# Patient Record
Sex: Male | Born: 2000 | Race: White | Hispanic: No | Marital: Single | Smoking: Never smoker
Health system: Southern US, Community
[De-identification: ages and names within clinical notes are randomized; demographics above are authoritative.]

## PROBLEM LIST (undated history)

## (undated) DIAGNOSIS — T7840XA Allergy, unspecified, initial encounter: Secondary | ICD-10-CM

---
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
## 2019-01-03 DIAGNOSIS — S022XXA Fracture of nasal bones, initial encounter for closed fracture: Secondary | ICD-10-CM | POA: Diagnosis not present

## 2019-03-05 ENCOUNTER — Telehealth: Payer: Self-pay | Admitting: Pediatrics

## 2019-03-05 NOTE — Telephone Encounter (Signed)
Spoke with Mom. Douglas Adams will drop off paperwork and pick up copy of Immunization record. If physical needed will get something set up with new GP.

## 2019-03-05 NOTE — Telephone Encounter (Signed)
Mom called stating she needed an appointment for [place] to come in and see you to have paperwork filled out for College. Asked her what exactly that was and she wasn't sure. Asked her if he needed a physical. She was unsure. She wanted to switch appointments with her daughter Orvil Feil so he could get in, but told her if he needed a physical there wasn't time for that as her daughters appointment was for immunizations only.  Told Mother I would speak with Dr. Anastasio Champion, but that we are booked through September into October.  Mom states the school not being very helpful with information. Informed her Sriansh's last [facility] was Sept of last year.

## 2019-03-19 ENCOUNTER — Ambulatory Visit: Payer: Medicaid Other | Admitting: Pediatrics

## 2019-03-19 ENCOUNTER — Encounter: Payer: Self-pay | Admitting: Pediatrics

## 2019-03-19 ENCOUNTER — Other Ambulatory Visit: Payer: Self-pay

## 2019-03-19 VITALS — Temp 97.7°F | Ht 67.5 in | Wt 142.4 lb

## 2019-03-19 DIAGNOSIS — Z23 Encounter for immunization: Secondary | ICD-10-CM

## 2019-03-19 NOTE — Progress Notes (Addendum)
CC: Hepatitis A vaccine HPI: Patient is here for his second hepatitis A vaccine.  Patient is doing well today.  No questions or concerns.  Mother states that she only wants the "mandatory" vaccines.  Given that the patient has already had the first hepatitis A, the patient is fine getting his second hepatitis A vaccine today.   Vitals: Weight: 142 pounds 6 ounces            Height: 5 foot 7.5 inches            Temp: 97.7   Allergies: Penicillin   Assessment: Immunization Plan: Patient received his second hepatitis A vaccine today.  Documented on his college forms.

## 2019-05-15 DIAGNOSIS — H5043 Accommodative component in esotropia: Secondary | ICD-10-CM | POA: Diagnosis not present

## 2019-05-15 DIAGNOSIS — H538 Other visual disturbances: Secondary | ICD-10-CM | POA: Diagnosis not present

## 2019-05-27 DIAGNOSIS — J3089 Other allergic rhinitis: Secondary | ICD-10-CM | POA: Diagnosis not present

## 2019-05-27 DIAGNOSIS — J3081 Allergic rhinitis due to animal (cat) (dog) hair and dander: Secondary | ICD-10-CM | POA: Diagnosis not present

## 2019-05-27 DIAGNOSIS — H1045 Other chronic allergic conjunctivitis: Secondary | ICD-10-CM | POA: Diagnosis not present

## 2019-05-27 DIAGNOSIS — J301 Allergic rhinitis due to pollen: Secondary | ICD-10-CM | POA: Diagnosis not present

## 2019-06-02 DIAGNOSIS — I861 Scrotal varices: Secondary | ICD-10-CM | POA: Diagnosis not present

## 2020-11-09 DIAGNOSIS — M25561 Pain in right knee: Secondary | ICD-10-CM | POA: Diagnosis not present

## 2020-12-03 DIAGNOSIS — M25561 Pain in right knee: Secondary | ICD-10-CM | POA: Diagnosis not present

## 2021-01-24 DIAGNOSIS — J0100 Acute maxillary sinusitis, unspecified: Secondary | ICD-10-CM | POA: Diagnosis not present

## 2021-03-29 DIAGNOSIS — S93492A Sprain of other ligament of left ankle, initial encounter: Secondary | ICD-10-CM | POA: Diagnosis not present

## 2021-04-29 DIAGNOSIS — H1045 Other chronic allergic conjunctivitis: Secondary | ICD-10-CM | POA: Diagnosis not present

## 2021-04-29 DIAGNOSIS — J3089 Other allergic rhinitis: Secondary | ICD-10-CM | POA: Diagnosis not present

## 2021-04-29 DIAGNOSIS — J3081 Allergic rhinitis due to animal (cat) (dog) hair and dander: Secondary | ICD-10-CM | POA: Diagnosis not present

## 2021-04-29 DIAGNOSIS — J301 Allergic rhinitis due to pollen: Secondary | ICD-10-CM | POA: Diagnosis not present

## 2021-11-10 DIAGNOSIS — S838X1A Sprain of other specified parts of right knee, initial encounter: Secondary | ICD-10-CM | POA: Diagnosis not present

## 2021-11-11 DIAGNOSIS — S838X1A Sprain of other specified parts of right knee, initial encounter: Secondary | ICD-10-CM | POA: Diagnosis not present

## 2021-11-12 DIAGNOSIS — M25561 Pain in right knee: Secondary | ICD-10-CM | POA: Diagnosis not present

## 2021-11-15 DIAGNOSIS — M25561 Pain in right knee: Secondary | ICD-10-CM | POA: Diagnosis not present

## 2021-11-22 ENCOUNTER — Encounter (HOSPITAL_BASED_OUTPATIENT_CLINIC_OR_DEPARTMENT_OTHER): Payer: Self-pay | Admitting: Orthopaedic Surgery

## 2021-11-29 NOTE — Discharge Instructions (Addendum)
Ramond Marrowax Varkey MD, MPH Alfonse Alpersaroline McBane, PA-C [facility] [address], [address] [phone] (tel)   [phone] (fax)   POST-OPERATIVE INSTRUCTIONS - ACL RECONSTRUCTION  WOUND CARE You may remove the Operative Dressing on Post-Op Day #3 (72hrs after surgery).   Leave steri strips in place.   If you feel more comfortable with it you can leave all dressings in place till your 1 week follow-up appointment.   KEEP THE INCISIONS CLEAN AND DRY. An ACE wrap may be used to control swelling, do not wrap this too tight.  If the initial ACE wrap feels too tight or constricting you may loosen it. There may be a small amount of fluid/bleeding leaking at the surgical site.  This is normal; the knee is filled with fluid during the procedure and can leak for 24-48hrs after surgery.  You may change/reinforce the bandage as needed.  Use the Cryocuff, GameReady or Ice as often as possible for the first 3-4 days, then as needed for pain relief. Always keep a towel, ACE wrap or other barrier between the cooling unit and your skin.  You may shower on Post-Op Day #3. Gently pat the area dry. Do not soak the knee in water.  Do not go swimming in the pool or ocean until 4 weeks after surgery or when otherwise instructed.  BRACE/AMBULATION Your leg will be placed in a brace post-operatively.  You will need to wear your brace at all times until we discuss it further.  It should be locked in full extension (0 degrees) if adjustable.   You will be instructed on further bracing after your first visit. Use crutches for comfort but you can put your full weight on the leg as tolerated.  PHYSICAL THERAPY - You will begin physical therapy soon after surgery (unless otherwise specified) - Please call to set up an appointment, if you do not already have one  - Let our office if there are any issues with scheduling your therapy  - A PT referral was sent to [facility]  REGIONAL   REGIONAL ANESTHESIA (NERVE BLOCKS) - The anesthesia team may have performed a nerve block for you if safe in the setting of your care.  This is a great tool used to minimize pain.  Typically the block may start wearing off overnight.  This can be a challenging period but please utilize your as needed pain medications to try and manage this period and know it will be a brief transition as the nerve block wears completely   POST-OP MEDICATIONS- Multimodal approach to pain control In general your pain will be controlled with a combination of substances.  Prescriptions unless otherwise discussed are electronically sent to your pharmacy.  This is a carefully made plan we use to minimize narcotic use.     Meloxicam - Anti-inflammatory medication taken on a scheduled basis Acetaminophen - Non-narcotic pain medicine taken on a scheduled basis  Oxycodone - This is a strong narcotic, to be used only on an "as needed" basis for SEVERE pain. Aspirin 81mg  - This medicine is used to minimize the risk of blood clots after surgery. Zofran - take as needed for nausea   FOLLOW-UP Please call the office to schedule a follow-up appointment for your incision check if you do not already have one, 7-10 days post-operatively. IF YOU HAVE ANY QUESTIONS, PLEASE FEEL FREE TO CALL OUR OFFICE.  HELPFUL INFORMATION  If you had a block, it will wear off  between 8-24 hrs postop typically.  This is period when your pain may go from nearly zero to the pain you would have had post-op without the block.  This is an abrupt transition but nothing dangerous is happening.  You may take an extra dose of narcotic when this happens.  Keep your leg elevated to decrease swelling, which will then in turn decrease your pain. I would elevate the foot of your bed by putting a couple of couch pillows between your mattress and box spring. I would not keep pillow directly under your ankle.  You must wear the brace locked while  sleeping and ambulating until follow-up.   There will be MORE swelling on days 1-3 than there is on the day of surgery.  This also is normal. The swelling will decrease with the anti-inflammatory medication, ice and keeping it elevated. The swelling will make it more difficult to bend your knee. As the swelling goes down your motion will become easier  You may develop swelling and bruising that extends from your knee down to your calf and perhaps even to your foot over the next week. Do not be alarmed. This too is normal, and it is due to gravity  There may be some numbness adjacent to the incision site. This may last for 6-12 months or longer in some patients and is expected.  You may return to sedentary work/school in the next couple of days when you feel up to it. You will need to keep your leg elevated as much as possible   You should wean off your narcotic medicines as soon as you are able.  Most patients will be off or using minimal narcotics before their first postop appointment.   We suggest you use the pain medication the first night prior to going to bed, in order to ease any pain when the anesthesia wears off. You should avoid taking pain medications on an empty stomach as it will make you nauseous.  Do not drink alcoholic beverages or take illicit drugs when taking pain medications.  It is against the law to drive while taking narcotics. You cannot drive if your Right leg is in brace locked in extension.  Pain medication may make you constipated.  Below are a few solutions to try in this order: Decrease the amount of pain medication if you aren't having pain. Drink lots of decaffeinated fluids. Drink prune juice and/or each dried prunes  If the first 3 don't work start with additional solutions Take Colace - an over-the-counter stool softener Take Senokot - an over-the-counter laxative Take Miralax - a stronger over-the-counter laxative  For more information including helpful  videos and documents visit our website:   [email or web address]    Post Anesthesia Home Care Instructions  Activity: Get plenty of rest for the remainder of the day. A responsible individual must stay with you for 24 hours following the procedure.  For the next 24 hours, DO NOT: -Drive a car -Advertising copywriter -Drink alcoholic beverages -Take any medication unless instructed by your physician -Make any legal decisions or sign important papers.  Meals: Start with liquid foods such as gelatin or soup. Progress to regular foods as tolerated. Avoid greasy, spicy, heavy foods. If nausea and/or vomiting occur, drink only clear liquids until the nausea and/or vomiting subsides. Call your physician if vomiting continues.  Special Instructions/Symptoms: Your throat may feel dry or sore from the anesthesia or the breathing tube placed in your throat during surgery. If this causes discomfort, gargle  with warm salt water. The discomfort should disappear within 24 hours.  If you had a scopolamine patch placed behind your ear for the management of post- operative nausea and/or vomiting:  1. The medication in the patch is effective for 72 hours, after which it should be removed.  Wrap patch in a tissue and discard in the trash. Wash hands thoroughly with soap and water. 2. You may remove the patch earlier than 72 hours if you experience unpleasant side effects which may include dry mouth, dizziness or visual disturbances. 3. Avoid touching the patch. Wash your hands with soap and water after contact with the patch.  Regional Anesthesia Blocks  1. Numbness or the inability to move the "blocked" extremity may last from 3-48 hours after placement. The length of time depends on the medication injected and your individual response to the medication. If the numbness is not going away after 48 hours, call your surgeon.  2. The extremity that is blocked will need to be  protected until the numbness is gone and the  Strength has returned. Because you cannot feel it, you will need to take extra care to avoid injury. Because it may be weak, you may have difficulty moving it or using it. You may not know what position it is in without looking at it while the block is in effect.  3. For blocks in the legs and feet, returning to weight bearing and walking needs to be done carefully. You will need to wait until the numbness is entirely gone and the strength has returned. You should be able to move your leg and foot normally before you try and bear weight or walk. You will need someone to be with you when you first try to ensure you do not fall and possibly risk injury.  4. Bruising and tenderness at the needle site are common side effects and will resolve in a few days.  5. Persistent numbness or new problems with movement should be communicated to the surgeon or the [facility] [phone] [facility] [phone]).

## 2021-11-29 NOTE — H&P (Signed)
PREOPERATIVE H&P  Chief Complaint: RIGHT KNEE ACL TEAR  HPI: Douglas Adams is a 21 y.o. male who is scheduled for, Procedure(s): RECONSTRUCTION ANTERIOR CRUCIATE LIGAMENT (ACL).   Patient is a healthy 21 year old who had a twisting injury while playing basketball.  He had a pop with swelling afterwards.  He was unable to bear weight.  Symptoms are rated as moderate to severe, and have been worsening.  This is significantly impairing activities of daily living.    Please see clinic note for further details on this patient's care.    He has elected for surgical management.   Past Medical History:  Diagnosis Date   Allergies    Past Surgical History:  Procedure Laterality Date   ADENOIDECTOMY AND MYRINGOTOMY WITH TUBE PLACEMENT Bilateral 2007   Social History   Socioeconomic History   Marital status: Single    Spouse name: Not on file   Number of children: Not on file   Years of education: Not on file   Highest education level: Not on file  Occupational History   Not on file  Tobacco Use   Smoking status: Never   Smokeless tobacco: Never  Vaping Use   Vaping Use: Never used  Substance and Sexual Activity   Alcohol use: No   Drug use: Never   Sexual activity: Not on file  Other Topics Concern   Not on file  Social History Narrative   Not on file   Social Determinants of Health   Financial Resource Strain: Not on file  Food Insecurity: Not on file  Transportation Needs: Not on file  Physical Activity: Not on file  Stress: Not on file  Social Connections: Not on file   History reviewed. No pertinent family history. Allergies  Allergen Reactions   Augmentin [Amoxicillin-Pot Clavulanate] Nausea And Vomiting   Prior to Admission medications   Medication Sig Start Date End Date Taking? Authorizing Provider  ibuprofen (ADVIL,MOTRIN) 600 MG tablet Take 1 tablet (600 mg total) by mouth every 6 (six) hours as needed. 03/28/17  Yes Upstill, Shari, PA-C   montelukast (SINGULAIR) 10 MG tablet Take 10 mg by mouth at bedtime.   Yes [provider]  naproxen sodium (ALEVE) 220 MG tablet Take 220 mg by mouth.   Yes [provider]  cetirizine (ZYRTEC) 10 MG tablet Take 10 mg by mouth daily.    [provider]  fluticasone (FLONASE) 50 MCG/ACT nasal spray Place 1 spray into the nose daily. 03/30/11 03/29/12  Marcha Solders, MD    ROS: All other systems have been reviewed and were otherwise negative with the exception of those mentioned in the HPI and as above.  Physical Exam: General: Alert, no acute distress Cardiovascular: No pedal edema Respiratory: No cyanosis, no use of accessory musculature GI: No organomegaly, abdomen is soft and non-tender Skin: No lesions in the area of chief complaint Neurologic: Sensation intact distally Psychiatric: Patient is competent for consent with normal mood and affect Lymphatic: No axillary or cervical lymphadenopathy  MUSCULOSKELETAL:  Range of motion of the knee is limited.  He has 5-90 degrees of range of motion.  He has a grossly positive Lachman's with significantly increased translation compared to the contralateral side.  Exam is limited outside of this.  Imaging: MRI demonstrates a full ACL rupture with a bone bruise, medial MCL sprain.  Assessment: RIGHT KNEE ACL TEAR  Plan: Plan for Procedure(s): RECONSTRUCTION ANTERIOR CRUCIATE LIGAMENT (ACL)  The risks benefits and alternatives were discussed  with the patient including but not limited to the risks of nonoperative treatment, versus surgical intervention including infection, bleeding, nerve injury,  blood clots, cardiopulmonary complications, morbidity, mortality, among others, and they were willing to proceed.   The patient acknowledged the explanation, agreed to proceed with the plan and consent was signed.   Operative Plan: Right knee scope with BTB ACL reconstruction Discharge Medications: Standard DVT  Prophylaxis: Aspirin Physical Therapy: Outpatient PT- [facility] [place] [facility] Discharge needs: [place] (order). Foster, PA-C  11/29/2021 4:42 PM

## 2021-12-01 ENCOUNTER — Encounter (HOSPITAL_BASED_OUTPATIENT_CLINIC_OR_DEPARTMENT_OTHER): Payer: Self-pay | Admitting: Orthopaedic Surgery

## 2021-12-01 ENCOUNTER — Ambulatory Visit (HOSPITAL_BASED_OUTPATIENT_CLINIC_OR_DEPARTMENT_OTHER): Payer: 59 | Admitting: Anesthesiology

## 2021-12-01 ENCOUNTER — Ambulatory Visit (HOSPITAL_BASED_OUTPATIENT_CLINIC_OR_DEPARTMENT_OTHER)
Admission: RE | Admit: 2021-12-01 | Discharge: 2021-12-01 | Disposition: A | Discharge status: Home / Self Care | Payer: 59 | Attending: Orthopaedic Surgery | Admitting: Orthopaedic Surgery

## 2021-12-01 ENCOUNTER — Encounter (HOSPITAL_BASED_OUTPATIENT_CLINIC_OR_DEPARTMENT_OTHER)
Admission: RE | Disposition: A | Discharge status: Home / Self Care | Payer: Self-pay | Source: Home / Self Care | Attending: Orthopaedic Surgery

## 2021-12-01 ENCOUNTER — Other Ambulatory Visit: Payer: Self-pay

## 2021-12-01 DIAGNOSIS — Y9367 Activity, basketball: Secondary | ICD-10-CM | POA: Insufficient documentation

## 2021-12-01 DIAGNOSIS — X501XXA Overexertion from prolonged static or awkward postures, initial encounter: Secondary | ICD-10-CM | POA: Insufficient documentation

## 2021-12-01 DIAGNOSIS — S83511A Sprain of anterior cruciate ligament of right knee, initial encounter: Secondary | ICD-10-CM

## 2021-12-01 DIAGNOSIS — Z9889 Other specified postprocedural states: Secondary | ICD-10-CM

## 2021-12-01 DIAGNOSIS — M2341 Loose body in knee, right knee: Secondary | ICD-10-CM | POA: Diagnosis not present

## 2021-12-01 DIAGNOSIS — R112 Nausea with vomiting, unspecified: Secondary | ICD-10-CM

## 2021-12-01 DIAGNOSIS — S83281A Other tear of lateral meniscus, current injury, right knee, initial encounter: Secondary | ICD-10-CM | POA: Insufficient documentation

## 2021-12-01 HISTORY — PX: ANTERIOR CRUCIATE LIGAMENT REPAIR: SHX115

## 2021-12-01 HISTORY — DX: Allergy, unspecified, initial encounter: T78.40XA

## 2021-12-01 HISTORY — DX: Other specified postprocedural states: Z98.890

## 2021-12-01 HISTORY — DX: Other specified postprocedural states: R11.2

## 2021-12-01 SURGERY — RECONSTRUCTION, KNEE, ACL
Anesthesia: General | Site: Knee | Laterality: Right

## 2021-12-01 MED ORDER — DEXAMETHASONE SODIUM PHOSPHATE 10 MG/ML IJ SOLN
INTRAMUSCULAR | Status: AC
  Filled 2021-12-01: qty 1

## 2021-12-01 MED ORDER — ONDANSETRON HCL 4 MG/2ML IJ SOLN
INTRAMUSCULAR | Status: AC
  Filled 2021-12-01: qty 2

## 2021-12-01 MED ORDER — SODIUM CHLORIDE 0.9 % IV SOLN
12.5000 mg | Freq: Once | INTRAVENOUS | Status: AC
  Administered 2021-12-01: 12.5 mg via INTRAVENOUS
  Filled 2021-12-01: qty 0.5

## 2021-12-01 MED ORDER — EPINEPHRINE PF 1 MG/ML IJ SOLN
INTRAMUSCULAR | Status: AC
  Filled 2021-12-01: qty 2

## 2021-12-01 MED ORDER — ACETAMINOPHEN 325 MG PO TABS: 325.0000 mg | ORAL_TABLET | ORAL | Status: DC | PRN

## 2021-12-01 MED ORDER — METOCLOPRAMIDE HCL 5 MG PO TABS: 5.0000 mg | ORAL_TABLET | Freq: Three times a day (TID) | ORAL | Status: DC | PRN

## 2021-12-01 MED ORDER — AMISULPRIDE (ANTIEMETIC) 5 MG/2ML IV SOLN
10.0000 mg | Freq: Once | INTRAVENOUS | Status: AC
  Administered 2021-12-01: 10 mg via INTRAVENOUS

## 2021-12-01 MED ORDER — HYDROMORPHONE HCL 1 MG/ML IJ SOLN
INTRAMUSCULAR | Status: AC
  Filled 2021-12-01: qty 1

## 2021-12-01 MED ORDER — HYDROMORPHONE HCL 1 MG/ML IJ SOLN
INTRAMUSCULAR | Status: AC
  Filled 2021-12-01: qty 0.5

## 2021-12-01 MED ORDER — MIDAZOLAM HCL 2 MG/2ML IJ SOLN
INTRAMUSCULAR | Status: AC
  Filled 2021-12-01: qty 2

## 2021-12-01 MED ORDER — LACTATED RINGERS IV SOLN: INTRAVENOUS | Status: DC

## 2021-12-01 MED ORDER — FENTANYL CITRATE (PF) 100 MCG/2ML IJ SOLN
INTRAMUSCULAR | Status: DC | PRN
  Administered 2021-12-01 (×2): 25 ug via INTRAVENOUS

## 2021-12-01 MED ORDER — HYDROMORPHONE HCL 1 MG/ML IJ SOLN: 0.5000 mg | INTRAMUSCULAR | Status: DC | PRN

## 2021-12-01 MED ORDER — ONDANSETRON HCL 4 MG PO TABS: 4.0000 mg | ORAL_TABLET | Freq: Four times a day (QID) | ORAL | Status: DC | PRN

## 2021-12-01 MED ORDER — OXYCODONE HCL 5 MG PO TABS: ORAL_TABLET | ORAL | 0 refills | Status: AC

## 2021-12-01 MED ORDER — FENTANYL CITRATE (PF) 100 MCG/2ML IJ SOLN
INTRAMUSCULAR | Status: AC
  Filled 2021-12-01: qty 2

## 2021-12-01 MED ORDER — ACETAMINOPHEN 500 MG PO TABS
1000.0000 mg | ORAL_TABLET | Freq: Three times a day (TID) | ORAL | 0 refills | Status: AC
Start: 2021-12-01 — End: 2021-12-15

## 2021-12-01 MED ORDER — DEXAMETHASONE SODIUM PHOSPHATE 10 MG/ML IJ SOLN
INTRAMUSCULAR | Status: DC | PRN
  Administered 2021-12-01: 10 mg via INTRAVENOUS

## 2021-12-01 MED ORDER — DOCUSATE SODIUM 100 MG PO CAPS
100.0000 mg | ORAL_CAPSULE | Freq: Two times a day (BID) | ORAL | Status: DC
Start: 2021-12-01 — End: 2021-12-02

## 2021-12-01 MED ORDER — ONDANSETRON HCL 4 MG/2ML IJ SOLN
INTRAMUSCULAR | Status: DC | PRN
  Administered 2021-12-01: 4 mg via INTRAVENOUS

## 2021-12-01 MED ORDER — ONDANSETRON HCL 4 MG/2ML IJ SOLN: 4.0000 mg | Freq: Four times a day (QID) | INTRAMUSCULAR | Status: DC | PRN

## 2021-12-01 MED ORDER — MEPERIDINE HCL 25 MG/ML IJ SOLN
6.2500 mg | INTRAMUSCULAR | Status: DC | PRN
  Administered 2021-12-01 (×3): 6.25 mg via INTRAVENOUS

## 2021-12-01 MED ORDER — METHOCARBAMOL 1000 MG/10ML IJ SOLN
500.0000 mg | Freq: Four times a day (QID) | INTRAVENOUS | Status: DC | PRN
  Filled 2021-12-01: qty 5

## 2021-12-01 MED ORDER — OXYCODONE HCL 5 MG PO TABS
5.0000 mg | ORAL_TABLET | ORAL | Status: DC | PRN
  Administered 2021-12-01: 5 mg via ORAL
  Filled 2021-12-01: qty 1

## 2021-12-01 MED ORDER — OXYCODONE HCL 5 MG/5ML PO SOLN: 5.0000 mg | Freq: Once | ORAL | Status: DC | PRN

## 2021-12-01 MED ORDER — AMISULPRIDE (ANTIEMETIC) 5 MG/2ML IV SOLN
INTRAVENOUS | Status: AC
Start: 2021-12-01 — End: ?
  Filled 2021-12-01: qty 4

## 2021-12-01 MED ORDER — ACETAMINOPHEN 160 MG/5ML PO SOLN: 325.0000 mg | ORAL | Status: DC | PRN

## 2021-12-01 MED ORDER — CEFAZOLIN SODIUM-DEXTROSE 2-4 GM/100ML-% IV SOLN
INTRAVENOUS | Status: AC
  Filled 2021-12-01: qty 100

## 2021-12-01 MED ORDER — PROPOFOL 10 MG/ML IV BOLUS
INTRAVENOUS | Status: DC | PRN
  Administered 2021-12-01 (×2): 100 mg via INTRAVENOUS

## 2021-12-01 MED ORDER — ONDANSETRON HCL 4 MG/2ML IJ SOLN: 4.0000 mg | Freq: Once | INTRAMUSCULAR | Status: AC

## 2021-12-01 MED ORDER — CEFAZOLIN SODIUM-DEXTROSE 2-4 GM/100ML-% IV SOLN
2.0000 g | INTRAVENOUS | Status: AC
  Administered 2021-12-01: 2 g via INTRAVENOUS

## 2021-12-01 MED ORDER — MIDAZOLAM HCL 2 MG/2ML IJ SOLN
2.0000 mg | Freq: Once | INTRAMUSCULAR | Status: AC
  Administered 2021-12-01: 2 mg via INTRAVENOUS

## 2021-12-01 MED ORDER — ONDANSETRON HCL 4 MG/2ML IJ SOLN
INTRAMUSCULAR | Status: AC
Start: 2021-12-01 — End: ?
  Filled 2021-12-01: qty 2

## 2021-12-01 MED ORDER — ACETAMINOPHEN 500 MG PO TABS
ORAL_TABLET | ORAL | Status: AC
  Filled 2021-12-01: qty 1

## 2021-12-01 MED ORDER — ONDANSETRON HCL 4 MG PO TABS: 4.0000 mg | ORAL_TABLET | Freq: Three times a day (TID) | ORAL | 0 refills | Status: AC | PRN

## 2021-12-01 MED ORDER — HYDROMORPHONE HCL 1 MG/ML IJ SOLN
0.5000 mg | INTRAMUSCULAR | Status: DC | PRN
  Administered 2021-12-01: 0.25 mg via INTRAVENOUS

## 2021-12-01 MED ORDER — BISACODYL 5 MG PO TBEC: 5.0000 mg | DELAYED_RELEASE_TABLET | Freq: Every day | ORAL | Status: DC | PRN

## 2021-12-01 MED ORDER — ONDANSETRON HCL 4 MG/2ML IJ SOLN
4.0000 mg | Freq: Once | INTRAMUSCULAR | Status: AC | PRN
  Administered 2021-12-01: 4 mg via INTRAVENOUS

## 2021-12-01 MED ORDER — VANCOMYCIN HCL 1000 MG IV SOLR
INTRAVENOUS | Status: AC
  Filled 2021-12-01: qty 20

## 2021-12-01 MED ORDER — ACETAMINOPHEN 500 MG PO TABS: 1000.0000 mg | ORAL_TABLET | Freq: Four times a day (QID) | ORAL | Status: DC

## 2021-12-01 MED ORDER — METHOCARBAMOL 500 MG PO TABS: 500.0000 mg | ORAL_TABLET | Freq: Four times a day (QID) | ORAL | Status: DC | PRN

## 2021-12-01 MED ORDER — MELOXICAM 15 MG PO TABS: 15.0000 mg | ORAL_TABLET | Freq: Every day | ORAL | 0 refills | Status: DC

## 2021-12-01 MED ORDER — POLYETHYLENE GLYCOL 3350 17 G PO PACK: 17.0000 g | PACK | Freq: Every day | ORAL | Status: DC | PRN

## 2021-12-01 MED ORDER — OXYCODONE HCL 5 MG PO TABS: 10.0000 mg | ORAL_TABLET | ORAL | Status: DC | PRN

## 2021-12-01 MED ORDER — SODIUM CHLORIDE 0.9 % IR SOLN
Status: DC | PRN
  Administered 2021-12-01: 6000 mL

## 2021-12-01 MED ORDER — PROMETHAZINE HCL 25 MG/ML IJ SOLN
INTRAMUSCULAR | Status: AC
  Filled 2021-12-01: qty 1

## 2021-12-01 MED ORDER — METOCLOPRAMIDE HCL 5 MG/ML IJ SOLN: 5.0000 mg | Freq: Three times a day (TID) | INTRAMUSCULAR | Status: DC | PRN

## 2021-12-01 MED ORDER — ROPIVACAINE HCL 7.5 MG/ML IJ SOLN
INTRAMUSCULAR | Status: DC | PRN
  Administered 2021-12-01: 30 mL via PERINEURAL

## 2021-12-01 MED ORDER — SCOPOLAMINE 1 MG/3DAYS TD PT72
MEDICATED_PATCH | TRANSDERMAL | Status: AC
Start: 2021-12-01 — End: ?
  Filled 2021-12-01: qty 1

## 2021-12-01 MED ORDER — DEXAMETHASONE SODIUM PHOSPHATE 10 MG/ML IJ SOLN
INTRAMUSCULAR | Status: DC | PRN
  Administered 2021-12-01: 10 mg

## 2021-12-01 MED ORDER — SCOPOLAMINE 1 MG/3DAYS TD PT72
1.0000 | MEDICATED_PATCH | TRANSDERMAL | Status: DC
Start: 2021-12-01 — End: 2021-12-01
  Administered 2021-12-01: 1.5 mg via TRANSDERMAL

## 2021-12-01 MED ORDER — ASPIRIN 81 MG PO CHEW: 81.0000 mg | CHEWABLE_TABLET | Freq: Two times a day (BID) | ORAL | 0 refills | Status: AC

## 2021-12-01 MED ORDER — KETOROLAC TROMETHAMINE 30 MG/ML IJ SOLN
INTRAMUSCULAR | Status: DC | PRN
  Administered 2021-12-01: 30 mg via INTRAVENOUS

## 2021-12-01 MED ORDER — VANCOMYCIN HCL 1000 MG IV SOLR
INTRAVENOUS | Status: DC | PRN
  Administered 2021-12-01: 1000 mg via TOPICAL

## 2021-12-01 MED ORDER — MEPERIDINE HCL 25 MG/ML IJ SOLN
INTRAMUSCULAR | Status: AC
  Filled 2021-12-01: qty 1

## 2021-12-01 MED ORDER — HYDROMORPHONE HCL 1 MG/ML IJ SOLN
INTRAMUSCULAR | Status: DC | PRN
  Administered 2021-12-01: .5 mg via INTRAVENOUS

## 2021-12-01 MED ORDER — FENTANYL CITRATE (PF) 100 MCG/2ML IJ SOLN
100.0000 ug | Freq: Once | INTRAMUSCULAR | Status: AC
  Administered 2021-12-01: 100 ug via INTRAVENOUS

## 2021-12-01 MED ORDER — ACETAMINOPHEN 500 MG PO TABS
1000.0000 mg | ORAL_TABLET | Freq: Once | ORAL | Status: AC
  Administered 2021-12-01: 1000 mg via ORAL

## 2021-12-01 MED ORDER — PROPOFOL 10 MG/ML IV BOLUS
INTRAVENOUS | Status: AC
  Filled 2021-12-01: qty 20

## 2021-12-01 MED ORDER — PROMETHAZINE HCL 6.25 MG/5ML PO SYRP: 12.5000 mg | ORAL_SOLUTION | Freq: Four times a day (QID) | ORAL | Status: DC | PRN

## 2021-12-01 MED ORDER — OXYCODONE HCL 5 MG PO TABS: 5.0000 mg | ORAL_TABLET | Freq: Once | ORAL | Status: DC | PRN

## 2021-12-01 MED ORDER — FENTANYL CITRATE (PF) 100 MCG/2ML IJ SOLN
25.0000 ug | INTRAMUSCULAR | Status: DC | PRN
  Administered 2021-12-01: 25 ug via INTRAVENOUS

## 2021-12-01 SURGICAL SUPPLY — 69 items
APL PRP STRL LF DISP 70% ISPRP (MISCELLANEOUS) ×1
BLADE AVERAGE 25X9 (BLADE) ×2 IMPLANT
BLADE SHAVER BONE 5.0X13 (MISCELLANEOUS) ×2 IMPLANT
BLADE SURG 10 STRL SS (BLADE) ×2 IMPLANT
BLADE SURG 15 STRL LF DISP TIS (BLADE) ×1 IMPLANT
BLADE SURG 15 STRL SS (BLADE) ×2
BNDG ELASTIC 6X5.8 VLCR STR LF (GAUZE/BANDAGES/DRESSINGS) ×1 IMPLANT
BONE TUNNEL PLUG CANNULATED (MISCELLANEOUS) ×2 IMPLANT
BURR OVAL 8 FLU 4.0X13 (MISCELLANEOUS) IMPLANT
CHLORAPREP W/TINT 26 (MISCELLANEOUS) ×2 IMPLANT
CLSR STERI-STRIP ANTIMIC 1/2X4 (GAUZE/BANDAGES/DRESSINGS) ×2 IMPLANT
COLLECTOR GRAFT TISSUE (SYSTAGENIX WOUND MANAGEMENT) ×2
COOLER ICEMAN CLASSIC (MISCELLANEOUS) ×2 IMPLANT
COVER BACK TABLE 60X90IN (DRAPES) ×2 IMPLANT
CUFF TOURN SGL QUICK 34 (TOURNIQUET CUFF)
CUFF TRNQT CYL 34X4.125X (TOURNIQUET CUFF) IMPLANT
DISSECTOR 3.5MM X 13CM CVD (MISCELLANEOUS) IMPLANT
DISSECTOR 4.0MMX13CM CVD (MISCELLANEOUS) ×2 IMPLANT
DRAPE IMP U-DRAPE 54X76 (DRAPES) ×1 IMPLANT
DRAPE U-SHAPE 47X51 STRL (DRAPES) ×2 IMPLANT
DRAPE-T ARTHROSCOPY W/POUCH (DRAPES) ×2 IMPLANT
ELECT REM PT RETURN 9FT ADLT (ELECTROSURGICAL) ×2
ELECTRODE REM PT RTRN 9FT ADLT (ELECTROSURGICAL) ×1 IMPLANT
GAUZE SPONGE 4X4 12PLY STRL (GAUZE/BANDAGES/DRESSINGS) ×4 IMPLANT
GLOVE BIO SURGEON STRL SZ 6.5 (GLOVE) ×2 IMPLANT
GLOVE BIOGEL PI IND STRL 6.5 (GLOVE) ×1 IMPLANT
GLOVE BIOGEL PI IND STRL 7.0 (GLOVE) IMPLANT
GLOVE BIOGEL PI IND STRL 8 (GLOVE) ×1 IMPLANT
GLOVE BIOGEL PI INDICATOR 6.5 (GLOVE) ×1
GLOVE BIOGEL PI INDICATOR 7.0 (GLOVE) ×1
GLOVE BIOGEL PI INDICATOR 8 (GLOVE) ×2
GLOVE ECLIPSE 7.5 STRL STRAW (GLOVE) ×1 IMPLANT
GLOVE ECLIPSE 8.0 STRL XLNG CF (GLOVE) ×2 IMPLANT
GOWN STRL REUS W/ TWL LRG LVL3 (GOWN DISPOSABLE) ×2 IMPLANT
GOWN STRL REUS W/TWL LRG LVL3 (GOWN DISPOSABLE) ×4
GOWN STRL REUS W/TWL XL LVL3 (GOWN DISPOSABLE) ×2 IMPLANT
IMMOBILIZER KNEE 22 UNIV (SOFTGOODS) IMPLANT
IMMOBILIZER KNEE 24 THIGH 36 (MISCELLANEOUS) IMPLANT
IMMOBILIZER KNEE 24 UNIV (MISCELLANEOUS)
IV NS IRRIG 3000ML ARTHROMATIC (IV SOLUTION) ×8 IMPLANT
KIT TRANSTIBIAL (DISPOSABLE) ×2 IMPLANT
KNIFE GRAFT ACL 10MM 5952 (MISCELLANEOUS) ×2 IMPLANT
KNIFE GRAFT ACL 9MM (MISCELLANEOUS) IMPLANT
MANIFOLD NEPTUNE II (INSTRUMENTS) ×2 IMPLANT
NS IRRIG 1000ML POUR BTL (IV SOLUTION) ×2 IMPLANT
PACK ARTHROSCOPY DSU (CUSTOM PROCEDURE TRAY) ×2 IMPLANT
PACK BASIN DAY SURGERY FS (CUSTOM PROCEDURE TRAY) ×2 IMPLANT
PAD COLD SHLDR WRAP-ON (PAD) ×2 IMPLANT
PENCIL SMOKE EVACUATOR (MISCELLANEOUS) IMPLANT
PORT APPOLLO RF 90DEGREE MULTI (SURGICAL WAND) ×1 IMPLANT
PORTAL SKID DEVICE (INSTRUMENTS) IMPLANT
SCREW SHEATHED INTERF 7X25 (Screw) ×1 IMPLANT
SCREW SHEATHED INTERF 8X20MM (Screw) ×1 IMPLANT
SLEEVE SCD COMPRESS KNEE MED (STOCKING) ×1 IMPLANT
SPIKE FLUID TRANSFER (MISCELLANEOUS) IMPLANT
SPONGE T-LAP 4X18 ~~LOC~~+RFID (SPONGE) ×2 IMPLANT
SUT FIBERWIRE #2 38 T-5 BLUE (SUTURE) ×12
SUT MNCRL AB 4-0 PS2 18 (SUTURE) ×1 IMPLANT
SUT VIC AB 0 CT1 27 (SUTURE) ×4
SUT VIC AB 0 CT1 27XBRD ANBCTR (SUTURE) ×1 IMPLANT
SUT VIC AB 3-0 SH 27 (SUTURE) ×2
SUT VIC AB 3-0 SH 27X BRD (SUTURE) ×1 IMPLANT
SUTURE FIBERWR #2 38 T-5 BLUE (SUTURE) ×4 IMPLANT
SYR BULB EAR ULCER 3OZ GRN STR (SYRINGE) ×1 IMPLANT
TISSUE GRAFT COLLECTOR (SYSTAGENIX WOUND MANAGEMENT) IMPLANT
TOWEL GREEN STERILE FF (TOWEL DISPOSABLE) ×4 IMPLANT
TUBE CONNECTING 20X1/4 (TUBING) ×2 IMPLANT
TUBE SUCTION HIGH CAP CLEAR NV (SUCTIONS) ×2 IMPLANT
TUBING ARTHROSCOPY IRRIG 16FT (MISCELLANEOUS) ×2 IMPLANT

## 2021-12-01 NOTE — Anesthesia Preprocedure Evaluation (Addendum)
Anesthesia Evaluation  Patient identified by MRN, date of birth, ID band Patient awake    Reviewed: Allergy & Precautions, H&P , NPO status , Patient's Chart, lab work & pertinent test results, reviewed documented beta blocker date and time   Airway Mallampati: I  TM Distance: >3 FB Neck ROM: full    Dental no notable dental hx. (+) Teeth Intact, Dental Advisory Given   Pulmonary neg pulmonary ROS,    Pulmonary exam normal breath sounds clear to auscultation       Cardiovascular Exercise Tolerance: Good negative cardio ROS   Rhythm:regular Rate:Normal     Neuro/Psych negative neurological ROS  negative psych ROS   GI/Hepatic negative GI ROS, Neg liver ROS,   Endo/Other  negative endocrine ROS  Renal/GU negative Renal ROS  negative genitourinary   Musculoskeletal   Abdominal   Peds  Hematology negative hematology ROS (+)   Anesthesia Other Findings   Reproductive/Obstetrics negative OB ROS                            Anesthesia Physical Anesthesia Plan  ASA: 1  Anesthesia Plan: General   Post-op Pain Management: Regional block*, Tylenol PO (pre-op)* and Celebrex PO (pre-op)*   Induction:   PONV Risk Score and Plan: 2 and Ondansetron, Dexamethasone and Midazolam  Airway Management Planned: LMA  Additional Equipment: None  Intra-op Plan:   Post-operative Plan:   Informed Consent: I have reviewed the patients History and Physical, chart, labs and discussed the procedure including the risks, benefits and alternatives for the proposed anesthesia with the patient or authorized representative who has indicated his/her understanding and acceptance.     Dental Advisory Given  Plan Discussed with: CRNA and Anesthesiologist  Anesthesia Plan Comments:        Anesthesia Quick Evaluation

## 2021-12-01 NOTE — Interval H&P Note (Signed)
History and Physical Interval Note:  12/01/2021 10:19 AM  Douglas Adams  has presented today for surgery, with the diagnosis of RIGHT KNEE ACL TEAR.  The various methods of treatment have been discussed with the patient and family. After consideration of risks, benefits and other options for treatment, the patient has consented to  Procedure(s): RECONSTRUCTION ANTERIOR CRUCIATE LIGAMENT (ACL) (Right) as a surgical intervention.  The patient's history has been reviewed, patient examined, no change in status, stable for surgery.  I have reviewed the patient's chart and labs.  Questions were answered to the patient's satisfaction.     Hiram Gash

## 2021-12-01 NOTE — Op Note (Signed)
Orthopaedic Surgery Operative Note (CSN: [id number])  Douglas Adams  04/21/2001 Date of Surgery: 12/01/2021   Diagnoses:  Right ACL tear, lateral meniscus tear, loose body  Procedure: Right ACL reconstruction Partial lateral meniscectomy Loose body excision   Operative Finding Exam under anesthesia: Patient's range of motion was 3degrees to 130 degrees, grossly unstable Lachman.  The remainder of his ligament exam was normal. Suprapatellar pouch: Normal Patellofemoral Compartment: Normal Medial Compartment: Normal Lateral Compartment: Distal femur from the pivot shift injury had a indentation consistent with his bone bruise pattern, there is no articular cartilage defect here.  There was a tear at the posterior aspect of the lateral meniscus root however the root was still stable secondary to meniscal femoral ligaments.  We debrided the root itself.  About 10% total meniscal volume was resected. Intercondylar Notch: Osteochondral loose body with clear cartilage and cancellous bone was noted in the notch.  We remove this, 1 x 1 cm lesion.  We searched the knee but there was no obvious donor site.  It may have been an avulsion from some of the tibial insertion of the ACL or alternatively from the posterior aspect of the knee where I could not see.  Successful completion of the planned procedure.    Post-operative plan: The patient will be weightbearing to tolerance.  The patient will be discharged home.  DVT prophylaxis Aspirin 81 mg twice daily for 6 weeks.  Pain control with PRN pain medication preferring oral medicines.  Follow up plan will be scheduled in approximately 7 days for incision check and XR.  Post-Op Diagnosis: Same Surgeons:Primary: Bjorn Pippin, MD Assistants:Caroline McBane PA-C Location: [facility] OR ROOM 6 Anesthesia: General with regional Antibiotics: Ancef 2 g with local vancomycin powder 1 g at the surgical site Tourniquet time:  Total Tourniquet Time Documented: Thigh  (Right) - 67 minutes Total: Thigh (Right) - 67 minutes  Estimated Blood Loss: Minimal Complications: None Specimens: None Implants: Implant Name Type Inv. Item Serial No. Manufacturer Lot No. LRB No. Used Action  SCREW SHEATHED INTERF 8X20MM - [id number] Screw SCREW SHEATHED INTERF 8X20MM  [employer] [id number] Right 1 Implanted  SCREW SHEATHED INTERF 7X25 - [id number] Screw SCREW SHEATHED INTERF 7X25  [employer] [id number] Right 1 Implanted    Indications for Surgery:   Douglas Adams is a 20 y.o. male with ACL rupture.  Benefits and risks of operative and nonoperative management were discussed prior to surgery with patient/guardian(s) and informed consent form was completed.  Specific risks including infection, need for additional surgery, recurrent instability, arthritis, stiffness amongst others.   Procedure:   The patient was identified properly. Informed consent was obtained and the surgical site was marked. The patient was taken up to suite where general anesthesia was induced. The patient was placed in the supine position with a post against the surgical leg and a nonsterile tourniquet applied. The surgical leg was then prepped and draped usual sterile fashion.  A standard surgical timeout was performed.  2 standard anterior portals were made and diagnostic arthroscopy performed. Please note the findings as noted above.  We performed a loose body excision using a grasper of a 1 x 1 cm osteochondral loose body.  Partial lateral meniscectomy is performed with a shaver back to stable base.  We began by making an incision along the medial third of the patellar tendon in line with the tendon itself starting at the level of the distal pole of the patella ending 3 cm distal to the insertion on  the tubercle. We carried the incision down sharply achieving hemostasis 3 progressed identifying the tissue plane of the peritenon. The created skin flaps medially and laterally taking care to avoid damage to  the superficial skin. This point the peritenon was incised sharply in line with the tendon and again flaps are created exposing the medial and lateral borders of the tendon. We then took care to ensure that there is appropriate visualization for forearm harvest within our incision using a mobile window technique.  We then used a double bladed scalpel incised the tendon longitudinally 10 mm wide. This incision within the tendon was carried proximal and distally onto the tubercle and proximal wound patella to create a 25 mm bone block from patella and a 27 mm bone block from the tibia.  We made our longitudinal cuts with a saw taking care to not go deeper than 10 mm and rather than make a transverse patella cut we made a bullet style tapered cut at the proximal aspect of the patella to avoid the risk for transverse patella fracture.  The harvest went without issue and graft was taken to the back table.    This point we closed the defect in the patellar tendon after identifying that there was appropriate medial and lateral tendon still intact.   We began arthroscopy and made our lateral and medial portals within our incision on each side of the tendon. Fat pad was resected and diagnostic arthroscopy performed with the findings listed above.   The anterior cruciate ligament stump was debrided utilizing a shaver taking great care to preserve the remnant stump on the femur and the tibia for localization of our tunnels. Once the remnant anterior cruciate ligament was removed and we obtained appropriate visualization by performing a small notchplasty and confirmed that we had indeed identify the over-the-top position. We made small marks at the location of the aperture of the tibial and femoral tunnels and double checked our location prior to drilling.  We began with a femoral tunnel.  We had taken care to make a far medial and low anteromedial portal with spinal needle localization to ensure that we can get  appropriate position on the lateral wall of the notch from an anterior medial portal drilling technique.  We used a 7 mm offset guide with the knee at 90 degrees of flexion to mark in the position of the old ACL stump.  We then switched our camera to the medial portal and checked that our position was appropriate compared to the back wall.  At that point we hyperflexed the knee and used the 7 mm offset guide again primarily as a sheath to freehand place our wire at the aforementioned mark taking care to exit anterior to the mid femoral line to avoid posterior wall blowout.  Once the wire was advanced through the skin we clamped it and then placed a 10 mm acorn style reamer by hand ensuring that we did not interfere with the medial femoral condyle.  Once it entered the notch we are able to connect it to a reamer and ream to 35 mm of tunnel depth.  We used an [employer] GraftNet device to harvest with a shaver any of the bony debris and cleared bony debris from the tunnel to ensure that we had an easy pass.  We again checked from the medial portal to ensure that we only had a 2 mm posterior wall and appropriate position of our tunnel at the 10 position.  At this point  we advanced our Beath pin and shuttled a #2 FiberWire for eventual graft passage.  We turned our attention to the tibial tunnel.  We cleared the old ACL stump soft tissue.  We then used freehand techniques to put a 2.4 guidepin in the appropriate position.  We took great care to ensure that our wire was positioned appropriately.  We then reamed with the barrel reamer through our ACL harvest incision taking care to protect the skin.  We harvested bone as we reamed and then completed the reaming into the joint.  Any soft tissue was cleared from the apertures of the tunnel.  We finally checked our tunnel position and apertures once more and were happy were these and proceeded with graft shuttling.  The graft was shuttled in typical fashion and we  were able to visualize entering the femoral tunnel.  We ensured that the cancellous surface of the graft was anterior moving the collagen of the graft as far posterior as possible.  Before advancing the graft into the femoral socket we placed a guidewire for screw fixation.  The graft was then advanced the appropriate depth and we hyperflexed the knee for placement of our screw.  Femoral fixation was with a 7 x 25 mm metal Ar[employer]rex screw. We obtained good purchase with the screw. We verified arthroscopically that there is no sign of graft impingement on the notch. We then cycled the knee multiple times and turned our attention to the tibia.  Tibia was fixed with a 8 x 20 mm metal [employer] screw and the graft was extremely rotated 90 to anteriorize the tendinous portion within the joint. We achieved good purchase of the graft and there was minimal mismatch.  At this point a gentle Lachman maneuver was performed and there is a stable endpoint and little translation.  Autograft harvested from left over graft prep as well as reamings was used to bone graft the patella as well as the tibial defects the peritenon was closed.  Incision was closed in multilayer fashion with absorbable suture and Steri-Strips placed. Sterile dressing and knee brace were placed and patient taken to PACU without adverse event.    Incisions closed with absorbable suture. The patient was awoken from general anesthesia and taken to the PACU in stable condition without complication.   Noemi Chapel, PA-C, present and scrubbed throughout the case, critical for completion in a timely fashion, and for retraction, instrumentation, closure.

## 2021-12-01 NOTE — Anesthesia Procedure Notes (Signed)
Anesthesia Regional Block: Adductor canal block   Pre-Anesthetic Checklist: , timeout performed,  Correct Patient, Correct Site, Correct Laterality,  Correct Procedure, Correct Position, site marked,  Risks and benefits discussed,  Surgical consent,  Pre-op evaluation,  At surgeon's request and post-op pain management  Laterality: Right  Prep: chloraprep       Needles:  Injection technique: Single-shot  Needle Type: Echogenic Stimulator Needle     Needle Length: 5cm  Needle Gauge: 22     Additional Needles:   Procedures:, nerve stimulator,,, ultrasound used (permanent image in chart),,     Nerve Stimulator or Paresthesia:  Response: quadraceps contraction, 0.45 mA  Additional Responses:   Narrative:  Start time: 12/01/2021 10:35 AM End time: 12/01/2021 10:41 AM Injection made incrementally with aspirations every 5 mL.  Performed by: Personally  Anesthesiologist: Bethena Midget, MD  Additional Notes: Functioning IV was confirmed and monitors were applied.  A 66mm 22ga Arrow echogenic stimulator needle was used. Sterile prep and drape,hand hygiene and sterile gloves were used. Ultrasound guidance: relevant anatomy identified, needle position confirmed, local anesthetic spread visualized around nerve(s)., vascular puncture avoided.  Image printed for medical record. Negative aspiration and negative test dose prior to incremental administration of local anesthetic. The patient tolerated the procedure well.

## 2021-12-01 NOTE — Anesthesia Postprocedure Evaluation (Signed)
Anesthesia Post Note  Patient: Naval architect  Procedure(s) Performed: RECONSTRUCTION ANTERIOR CRUCIATE LIGAMENT (ACL) (Right: Knee)     Patient location during evaluation: PACU Anesthesia Type: General Level of consciousness: awake and alert Pain management: pain level controlled Vital Signs Assessment: post-procedure vital signs reviewed and stable Respiratory status: spontaneous breathing, nonlabored ventilation, respiratory function stable and patient connected to nasal cannula oxygen Cardiovascular status: blood pressure returned to baseline and stable : Nausea being treated with multiple medications. Anesthetic complications: yes   No notable events documented.  Last Vitals:  Vitals:   12/01/21 1405 12/01/21 1415  BP:    Pulse: 87 85  Resp: 15 13  Temp:    SpO2: 98% 97%    Last Pain:  Vitals:   12/01/21 1415  TempSrc:   PainSc: 7                  Dana Debo

## 2021-12-01 NOTE — Progress Notes (Signed)
Assisted Dr. Oddono with right, adductor canal, ultrasound guided block. Side rails up, monitors on throughout procedure. See vital signs in flow sheet. Tolerated Procedure well. 

## 2021-12-01 NOTE — Anesthesia Procedure Notes (Signed)
Procedure Name: LMA Insertion Date/Time: 12/01/2021 11:50 AM  Performed by: Verita Lamb, CRNAPre-anesthesia Checklist: Patient identified, Emergency Drugs available, Suction available and Patient being monitored Patient Re-evaluated:Patient Re-evaluated prior to induction Oxygen Delivery Method: Circle system utilized Preoxygenation: Pre-oxygenation with 100% oxygen Induction Type: IV induction Ventilation: Mask ventilation without difficulty LMA: LMA inserted LMA Size: 4.0 Number of attempts: 1 Airway Equipment and Method: Bite block Placement Confirmation: positive ETCO2, CO2 detector and breath sounds checked- equal and bilateral Tube secured with: Tape Dental Injury: Teeth and Oropharynx as per pre-operative assessment

## 2021-12-01 NOTE — Transfer of Care (Signed)
Immediate Anesthesia Transfer of Care Note  Patient: Douglas Adams  Procedure(s) Performed: RECONSTRUCTION ANTERIOR CRUCIATE LIGAMENT (ACL) (Right: Knee)  Patient Location: PACU  Anesthesia Type:General and Regional  Level of Consciousness: awake, alert  and oriented  Airway & Oxygen Therapy: Patient Spontanous Breathing and Patient connected to face mask oxygen  Post-op Assessment: Report given to RN and Post -op Vital signs reviewed and stable  Post vital signs: Reviewed and stable  Last Vitals:  Vitals Value Taken Time  BP 106/65 12/01/21 1306  Temp    Pulse 95 12/01/21 1308  Resp 15 12/01/21 1308  SpO2 100 % 12/01/21 1308  Vitals shown include unvalidated device data.  Last Pain:  Vitals:   12/01/21 1005  TempSrc: Oral  PainSc: 2       Patients Stated Pain Goal: 2 (12/01/21 1005)  Complications: No notable events documented.

## 2021-12-02 ENCOUNTER — Encounter (HOSPITAL_BASED_OUTPATIENT_CLINIC_OR_DEPARTMENT_OTHER): Payer: Self-pay | Admitting: Orthopaedic Surgery

## 2021-12-05 ENCOUNTER — Ambulatory Visit: Payer: 59 | Attending: Orthopaedic Surgery

## 2021-12-05 DIAGNOSIS — M25561 Pain in right knee: Secondary | ICD-10-CM | POA: Diagnosis present

## 2021-12-05 DIAGNOSIS — R262 Difficulty in walking, not elsewhere classified: Secondary | ICD-10-CM | POA: Diagnosis present

## 2021-12-05 DIAGNOSIS — M25661 Stiffness of right knee, not elsewhere classified: Secondary | ICD-10-CM

## 2021-12-05 NOTE — Therapy (Signed)
[facility] MAIN [facility] SERVICES [address] [place], [place], [address] Phone: [phone]   Fax:  [phone]  Physical Therapy Evaluation  Patient Details  Name: Douglas Adams MRN: [id number] Date of Birth: 13-Feb-2001 Referring Provider (PT): Ramond Marrow   Encounter Date: 12/05/2021   PT End of Session - 12/05/21 1604     Visit Number 1    Number of Visits 16    Date for PT Re-Evaluation 01/30/22    Authorization Type 1/10 eval 12/05/21    PT Start Time 1515    PT Stop Time 1559    PT Time Calculation (min) 44 min    Equipment Utilized During Treatment Gait belt;Other (comment);Right knee immobilizer   crutches   Activity Tolerance Patient tolerated treatment well;Patient limited by pain    Behavior During Therapy [facility] for tasks assessed/performed             Past Medical History:  Diagnosis Date   Allergies     Past Surgical History:  Procedure Laterality Date   ADENOIDECTOMY AND MYRINGOTOMY WITH TUBE PLACEMENT Bilateral 2007   ANTERIOR CRUCIATE LIGAMENT REPAIR Right 12/01/2021   Procedure: RECONSTRUCTION ANTERIOR CRUCIATE LIGAMENT (ACL);  Surgeon: Bjorn Pippin, MD;  Location: [facility];  Service: Orthopedics;  Laterality: Right;    There were no vitals filed for this visit.    Subjective Assessment - 12/05/21 1548     Subjective Patient is presenting s/p ACL repair.    Pertinent History Patient is a 21 year old male s/p ACL reconstruction with meniscal repair 12/01/21. Patient was injured with a twisting injury while playing basketball where he felt a pop and was unable to bear weight. Protocol not provided however Thereasa Parkin has protocol from clinic: knee extension lock for one week, d/c brace by 4 weeks if quad control appropriate. Progress PROM AAROM and AROM as tolerated for 0-6 weeks; with limitation of 0-90 for first 4 weeks. Still having nausea s/p surgery.    Limitations Sitting;Lifting;Standing;Walking;House  hold activities    How long can you sit comfortably? painful initially    How long can you stand comfortably? painful to stand, 5 minutes    How long can you walk comfortably? painful to walk, needs crutches    Patient Stated Goals to return to sport    Currently in Pain? Yes    Pain Score 6     Pain Location Leg    Pain Orientation Right    Pain Descriptors / Indicators Discomfort    Pain Type Surgical pain    Pain Onset In the past 7 days    Pain Frequency Constant    Aggravating Factors  weightbearing, moving limb    Pain Relieving Factors rest, ice, pain medication    Effect of Pain on Daily Activities limits mobility                [facility] PT Assessment - 12/05/21 0001       Assessment   Medical Diagnosis R ACL    Referring Provider (PT) Everardo Pacific, Dax    Onset Date/Surgical Date 12/01/21    Hand Dominance Right    Prior Therapy no      Precautions   Precautions Knee    Required Braces or Orthoses Knee Immobilizer - Right      Restrictions   Weight Bearing Restrictions Yes    RLE Weight Bearing Weight bearing as tolerated      Balance Screen   Has the  patient had a decrease in activity level because of a fear of falling?  Yes    Is the patient reluctant to leave their home because of a fear of falling?  No      Home Nurse, mental health Private residence    Living Arrangements Parent    Available Help at Discharge Family    Type of Home House    Home Access Stairs to enter    Entrance Stairs-Number of Steps 3    Home Layout Two level      Prior Function   Level of Independence Independent    Vocation Full time employment    Vocation Requirements exercise technique    Leisure basketball, volleyball, running, stay active, golf      Observation/Other Assessments   Focus on Therapeutic Outcomes (FOTO)  30    Other Surveys  Lower Extremity Functional Scale    Lower Extremity Functional Scale  19/80            Patient is a 21 year old male  s/p ACL reconstruction with meniscal repair 12/01/21. Patient was injured with a twisting injury while playing basketball where he felt a pop and was unable to bear weight. Protocol not provided however Thereasa Parkin has protocol from clinic: knee extension lock for one week, d/c brace by 4 weeks if quad control appropriate. Progress PROM AAROM and AROM as tolerated for 0-6 weeks; with limitation of 0-90 for first 4 weeks. Still having nausea s/p surgery.        PAIN: RLE Worst pain: 8/10 Best: 0/10 current pain: 6/10   POSTURE: Weight shift onto LLE in seated and standing Knee immobilizer locked in extension   PROM/AROM:  AROM BLE:  LLE WFL RLE: unable to perform due to protocol limitations  Accessory Motions:   Patella mobilizations: hypomobile with fluid and slight tenderness in all direction, tender to patella tendon insertion region.   STRENGTH:  Graded on a 0-5 scale Unable to test RLE at this time due to protocol   SENSATION:  BUE :  BLE :   NEUROLOGICAL SCREEN: (2+ unless otherwise noted.) N=normal  Ab=abnormal   Level Dermatome R L  C3 Anterior Neck  N N  C4 Top of Shoulder N N  C5 Lateral Upper Arm  N N  C6 Lateral Arm/ Thumb  N N  C7 Middle Finger  N N  C8 4th & 5th Finger N N  T1 Medial Arm N N  L2 Medial thigh/groin N N  L3 Lower thigh/med.knee N N  L4 Medial leg/lat thigh N N  L5 Lat. leg & dorsal foot N N  S1 post/lat foot/thigh/leg N N  S2 Post./med. thigh & leg N N    SOMATOSENSORY:  Any N & T in extremities or weakness: reports :         Sensation           Intact      Diminished         Absent  Light touch LEs       FUNCTIONAL MOBILITY: STS: severely limited by RLE in knee immobilizer   BALANCE: Weight shift onto RLE requires UE support due to pain despite wearing brace locked in extension   GAIT: Patient brace locked in full extension; utilizes crutches for support at this time, unable to put full weightbearing.   OUTCOME MEASURES: TEST  Outcome Interpretation  5 times sit<>stand Unable to test >33 yo, >15 sec indicates increased risk for  falls  10 meter walk test     13            m/s <1.0 m/s indicates increased risk for falls; limited community ambulator  LEFS 19/80   6 minute walk test   Unable to test             Feet 1000 feet is community ambulator      FOTO 30 Discharge recommendation of 71     Access Code: [email or web address] URL: [email or web address] Date: 12/05/2021 Prepared by: Precious BardMarina Mackinley Kiehn  Exercises - Long Sitting Quad Set  - 1 x daily - 7 x weekly - 2 sets - 10 reps - 5 hold - Supine Active Straight Leg Raise  - 1 x daily - 7 x weekly - 2 sets - 10 reps - 5 hold - Long Sitting Ankle Pumps  - 1 x daily - 7 x weekly - 2 sets - 10 reps - 5 hold - Standing Weight Shift Side to Side  - 1 x daily - 7 x weekly - 2 sets - 10 reps - 5 hold     Objective measurements completed on examination: See above findings.    Patient presents to physical therapy s/p ACL repair with meniscal repair on 12/01/21. Patient is still within first week and evaluation is limited by protocol requiring knee to be in full extension lock in brace. Will test ROM further next session when past the 1 week mark. Patient requires use of crutches for ambulation at this time and has limited weight shift onto surgical limb. Unable to perform 6 minute walk test and 5x STS at this time. Patient will benefit from skilled physical therapy to reduce pain, improve ROM, improve strength and mobility for return to PLOF.             PT Education - 12/05/21 1603     Education Details goals, POC    Person(s) Educated Patient    Methods Explanation;Demonstration;Tactile cues;Verbal cues    Comprehension Verbalized understanding;Returned demonstration;Verbal cues required;Tactile cues required              PT Short Term Goals - 12/05/21 1712       PT SHORT TERM GOAL #1   Title Patient will be independent in home exercise program to  improve strength/mobility for better functional independence with ADLs.    Baseline 6/12; HEP given    Time 4    Status New    Target Date 01/02/22      PT SHORT TERM GOAL #2   Title Patient will report a worst pain of 5/10 on VAS in R knee to improve tolerance with ADLs and reduced symptoms with activities.    Baseline 6/12: 8/10    Time 4    Period Weeks    Status New    Target Date 01/02/22      PT SHORT TERM GOAL #3   Title Patient will increase 10 meter walk test to >1.5350m/s with LRAD as to improve gait speed for better community ambulation and to reduce fall risk.    Baseline 6/12:0.77 with crutches    Time 4    Period Weeks    Status New    Target Date 01/02/22      PT SHORT TERM GOAL #4   Title --    Baseline --    Time --    Period --    Status --    Target Date --      PT  SHORT TERM GOAL #5   Title --    Baseline --    Time --    Period --    Status --    Target Date --               PT Long Term Goals - 12/05/21 1713       PT LONG TERM GOAL #1   Title Patient will increase FOTO score to equal to or greater than  71   to demonstrate statistically significant improvement in mobility and quality of life.    Baseline 6/12: 30    Time 8    Period Weeks    Status New    Target Date 01/30/22      PT LONG TERM GOAL #2   Title Patient will report a worst pain of 3/10 on VAS in  R knee  to improve tolerance with ADLs and reduced symptoms with activities.    Baseline 6/12: 8/10 pain    Time 8    Period Weeks    Status New    Target Date 01/30/22      PT LONG TERM GOAL #3   Title Patient will increase lower extremity functional scale to >60/80 to demonstrate improved functional mobility and increased tolerance with ADLs.    Baseline 6/12: 19/80    Time 8    Period Weeks    Status New    Target Date 01/30/22      PT LONG TERM GOAL #4   Title Patient will improve R knee ROM (neutral to 120 degrees) for return to PLOF and safe functional mobility.     Baseline 6/12: locked in extension    Time 8    Period Weeks    Status New    Target Date 01/30/22      PT LONG TERM GOAL #5   Title Patient will increase six minute walk test distance to >1000 for progression to community ambulator and improve gait ability    Baseline 6/12: unable to test    Time 8    Period Weeks    Status New    Target Date 01/30/22                    Plan - 12/05/21 1655     Clinical Impression Statement Patient presents to physical therapy s/p ACL repair with meniscal repair on 12/01/21. Patient is still within first week and evaluation is limited by protocol requiring knee to be in full extension lock in brace. Will test ROM further next session when past the 1 week mark. Patient requires use of crutches for ambulation at this time and has limited weight shift onto surgical limb. Unable to perform 6 minute walk test and 5x STS at this time. Patient will benefit from skilled physical therapy to reduce pain, improve ROM, improve strength and mobility for return to PLOF.    Personal Factors and Comorbidities Profession;Transportation    Examination-Activity Limitations Bathing;Bed Mobility;Bend;Caring for Others;Carry;Dressing;Hygiene/Grooming;Lift;Locomotion Level;Toileting;Stand;Stairs;Squat;Sit;Transfers    Examination-Participation Restrictions Cleaning;Community Activity;Driving;Meal Prep;Laundry;Occupation;Shop;Yard Work;Volunteer    Stability/Clinical Decision Making Stable/Uncomplicated    Clinical Decision Making Low    Rehab Potential Good    PT Frequency 2x / week    PT Duration 8 weeks    PT Treatment/Interventions ADLs/Self Care Home Management;Cryotherapy;Electrical Stimulation;Iontophoresis 4mg /ml Dexamethasone;Moist Heat;Gait training;DME Instruction;Stair training;Functional mobility training;Therapeutic activities;Therapeutic exercise;Patient/family education;Neuromuscular re-education;Balance training;Orthotic Fit/Training;Manual  techniques;Passive range of motion;Scar mobilization;Manual lymph drainage;Dry needling;Energy conservation;Splinting;Taping    PT Next Visit Plan  ROM, 6 minute walk test, 5x STS    PT Home Exercise Plan see above    Consulted and Agree with Plan of Care Patient             Patient will benefit from skilled therapeutic intervention in order to improve the following deficits and impairments:  Abnormal gait, Decreased activity tolerance, Decreased coordination, Decreased balance, Decreased endurance, Decreased range of motion, Decreased mobility, Decreased strength, Decreased scar mobility, Difficulty walking, Hypomobility, Increased edema, Impaired flexibility, Impaired perceived functional ability, Improper body mechanics, Pain  Visit Diagnosis: Acute pain of right knee  Stiffness of right knee, not elsewhere classified  Difficulty in walking, not elsewhere classified     Problem List There are no problems to display for this patient.   Precious Bard, PT, DPT  12/05/2021, 5:23 PM  [place] MAIN [facility] SERVICES [address] [place], [place], [address] Phone: [phone]   Fax:  [phone]  Name: Yovanni Frenette MRN: [id number] Date of Birth: [birth year]

## 2021-12-05 NOTE — Patient Instructions (Signed)
  Access Code: [email or web address] URL: [email or web address] Date: 12/05/2021 Prepared by: Precious Bard  Exercises - Long Sitting Quad Set  - 1 x daily - 7 x weekly - 2 sets - 10 reps - 5 hold - Supine Active Straight Leg Raise  - 1 x daily - 7 x weekly - 2 sets - 10 reps - 5 hold - Long Sitting Ankle Pumps  - 1 x daily - 7 x weekly - 2 sets - 10 reps - 5 hold - Standing Weight Shift Side to Side  - 1 x daily - 7 x weekly - 2 sets - 10 reps - 5 hold

## 2021-12-08 ENCOUNTER — Ambulatory Visit: Payer: 59

## 2021-12-08 DIAGNOSIS — M25661 Stiffness of right knee, not elsewhere classified: Secondary | ICD-10-CM

## 2021-12-08 DIAGNOSIS — M25561 Pain in right knee: Secondary | ICD-10-CM | POA: Diagnosis not present

## 2021-12-08 DIAGNOSIS — R262 Difficulty in walking, not elsewhere classified: Secondary | ICD-10-CM

## 2021-12-08 NOTE — Therapy (Signed)
[place] [facility] MAIN [facility] SERVICES [address] [place], [place], [address] Phone: [phone]   Fax:  [phone]  Physical Therapy Treatment  Patient Details  Name: Douglas Adams MRN: [id number] Date of Birth: [birth year] Referring Provider (PT): Ramond Marrow   Encounter Date: 12/08/2021   PT End of Session - 12/08/21 1511     Visit Number 2    Number of Visits 16    Date for PT Re-Evaluation 01/30/22    Authorization Type 2/10 eval 12/05/21    PT Start Time 1514    PT Stop Time 1600    PT Time Calculation (min) 46 min    Equipment Utilized During Treatment Gait belt;Other (comment);Right knee immobilizer   crutches   Activity Tolerance Patient tolerated treatment well;Patient limited by pain    Behavior During Therapy [facility] for tasks assessed/performed             Past Medical History:  Diagnosis Date   Allergies     Past Surgical History:  Procedure Laterality Date   ADENOIDECTOMY AND MYRINGOTOMY WITH TUBE PLACEMENT Bilateral 2007   ANTERIOR CRUCIATE LIGAMENT REPAIR Right 12/01/2021   Procedure: RECONSTRUCTION ANTERIOR CRUCIATE LIGAMENT (ACL);  Surgeon: Bjorn Pippin, MD;  Location: [facility];  Service: Orthopedics;  Laterality: Right;    There were no vitals filed for this visit.   Subjective Assessment - 12/08/21 1616     Subjective Patient reports having increased tenderness and pain from tuesday to today. Was at church last night and had more pain and pulsing by end of time due to leg being down. Seeing PA tomorrow    Pertinent History Patient is a 21 year old male s/p ACL reconstruction with meniscal repair 12/01/21. Patient was injured with a twisting injury while playing basketball where he felt a pop and was unable to bear weight. Protocol not provided however Thereasa Parkin has protocol from clinic: knee extension lock for one week, d/c brace by 4 weeks if quad control appropriate. Progress PROM AAROM and AROM as tolerated  for 0-6 weeks; with limitation of 0-90 for first 4 weeks. Still having nausea s/p surgery.    Limitations Sitting;Lifting;Standing;Walking;House hold activities    How long can you sit comfortably? painful initially    How long can you stand comfortably? painful to stand, 5 minutes    How long can you walk comfortably? painful to walk, needs crutches    Patient Stated Goals to return to sport    Currently in Pain? Yes    Pain Score 5     Pain Location Leg    Pain Orientation Right    Pain Descriptors / Indicators Aching    Pain Type Surgical pain    Pain Onset In the past 7 days    Pain Frequency Constant                  Manual: brace unlocked to 60 degrees Knee flexion/extension PROM 10x 10 second holds Patella mobilizations 10x 5 second mobilizations each direction Knee flexion over bolster PROM 8x20 second holds   therEx: Brace unlocked to 60 degrees contract relax knee flexion and extension pressing into PT shoulder 10x 5 second holds quad sets 10x 5 second holds SLR 10x 5 second holds; cue for quad set prior SAQ over bolster 10x with AAROM Brace locked in extension:  Weight shift green dynadisc  under RLE 3x30 seconds Walk with decreasing UE support 6x length of // bars RTB resisted ankle 4  way 12x each direction while ice on knee   Pt educated throughout session about proper posture and technique with exercises. Improved exercise technique, movement at target joints, use of target muscles after min to mod verbal, visual, tactile cues. Rationale for Evaluation and Treatment Rehabilitation    Patient tolerates introduction to accepting weight and flexion of knee. Education on not pushing into pain performed as well as need for elevation to reduce edema around surgical region. Patient does have limited tolerance of weightbearing which will continue to be an area of focus. He continues to be reliant upon crutches for ambulation at this time. Patient is highly  motivated and has an excellent prognosis at this time. Patient will benefit from skilled physical therapy to reduce pain, improve ROM, improve strength and mobility for return to PLOF                 PT Education - 12/08/21 1511     Education Details exercise technique, body mechanics    Person(s) Educated Patient    Methods Explanation;Demonstration;Tactile cues;Verbal cues    Comprehension Verbalized understanding;Returned demonstration;Verbal cues required;Tactile cues required              PT Short Term Goals - 12/05/21 1712       PT SHORT TERM GOAL #1   Title Patient will be independent in home exercise program to improve strength/mobility for better functional independence with ADLs.    Baseline 6/12; HEP given    Time 4    Status New    Target Date 01/02/22      PT SHORT TERM GOAL #2   Title Patient will report a worst pain of 5/10 on VAS in R knee to improve tolerance with ADLs and reduced symptoms with activities.    Baseline 6/12: 8/10    Time 4    Period Weeks    Status New    Target Date 01/02/22      PT SHORT TERM GOAL #3   Title Patient will increase 10 meter walk test to >1.74m/s with LRAD as to improve gait speed for better community ambulation and to reduce fall risk.    Baseline 6/12:0.77 with crutches    Time 4    Period Weeks    Status New    Target Date 01/02/22      PT SHORT TERM GOAL #4   Title --    Baseline --    Time --    Period --    Status --    Target Date --      PT SHORT TERM GOAL #5   Title --    Baseline --    Time --    Period --    Status --    Target Date --               PT Long Term Goals - 12/05/21 1713       PT LONG TERM GOAL #1   Title Patient will increase FOTO score to equal to or greater than  71   to demonstrate statistically significant improvement in mobility and quality of life.    Baseline 6/12: 30    Time 8    Period Weeks    Status New    Target Date 01/30/22      PT LONG TERM  GOAL #2   Title Patient will report a worst pain of 3/10 on VAS in  R knee  to improve tolerance with ADLs and  reduced symptoms with activities.    Baseline 6/12: 8/10 pain    Time 8    Period Weeks    Status New    Target Date 01/30/22      PT LONG TERM GOAL #3   Title Patient will increase lower extremity functional scale to >60/80 to demonstrate improved functional mobility and increased tolerance with ADLs.    Baseline 6/12: 19/80    Time 8    Period Weeks    Status New    Target Date 01/30/22      PT LONG TERM GOAL #4   Title Patient will improve R knee ROM (neutral to 120 degrees) for return to PLOF and safe functional mobility.    Baseline 6/12: locked in extension    Time 8    Period Weeks    Status New    Target Date 01/30/22      PT LONG TERM GOAL #5   Title Patient will increase six minute walk test distance to >1000 for progression to community ambulator and improve gait ability    Baseline 6/12: unable to test    Time 8    Period Weeks    Status New    Target Date 01/30/22                   Plan - 12/08/21 1620     Clinical Impression Statement Patient tolerates introduction to accepting weight and flexion of knee. Education on not pushing into pain performed as well as need for elevation to reduce edema around surgical region. Patient does have limited tolerance of weightbearing which will continue to be an area of focus. He continues to be reliant upon crutches for ambulation at this time. Patient is highly motivated and has an excellent prognosis at this time. Patient will benefit from skilled physical therapy to reduce pain, improve ROM, improve strength and mobility for return to PLOF    Personal Factors and Comorbidities Profession;Transportation    Examination-Activity Limitations Bathing;Bed Mobility;Bend;Caring for Others;Carry;Dressing;Hygiene/Grooming;Lift;Locomotion Level;Toileting;Stand;Stairs;Squat;Sit;Transfers    Examination-Participation  Restrictions Cleaning;Community Activity;Driving;Meal Prep;Laundry;Occupation;Shop;Yard Work;Volunteer    Stability/Clinical Decision Making Stable/Uncomplicated    Rehab Potential Good    PT Frequency 2x / week    PT Duration 8 weeks    PT Treatment/Interventions ADLs/Self Care Home Management;Cryotherapy;Electrical Stimulation;Iontophoresis 4mg /ml Dexamethasone;Moist Heat;Gait training;DME Instruction;Stair training;Functional mobility training;Therapeutic activities;Therapeutic exercise;Patient/family education;Neuromuscular re-education;Balance training;Orthotic Fit/Training;Manual techniques;Passive range of motion;Scar mobilization;Manual lymph drainage;Dry needling;Energy conservation;Splinting;Taping    PT Next Visit Plan ROM, 6 minute walk test, 5x STS    PT Home Exercise Plan see above    Consulted and Agree with Plan of Care Patient             Patient will benefit from skilled therapeutic intervention in order to improve the following deficits and impairments:  Abnormal gait, Decreased activity tolerance, Decreased coordination, Decreased balance, Decreased endurance, Decreased range of motion, Decreased mobility, Decreased strength, Decreased scar mobility, Difficulty walking, Hypomobility, Increased edema, Impaired flexibility, Impaired perceived functional ability, Improper body mechanics, Pain  Visit Diagnosis: Acute pain of right knee  Stiffness of right knee, not elsewhere classified  Difficulty in walking, not elsewhere classified     Problem List There are no problems to display for this patient.  , PT, DPT  12/08/2021, 4:22 PM  [place] [facility] MAIN [facility] SERVICES [address] [place], [place], [place] Phone: [phone]   Fax:  [phone]  Name: Baron Parmelee MRN: [id number] Deter Date of Birth: May 24, 2001

## 2021-12-13 ENCOUNTER — Ambulatory Visit: Payer: 59

## 2021-12-13 DIAGNOSIS — M25661 Stiffness of right knee, not elsewhere classified: Secondary | ICD-10-CM

## 2021-12-13 DIAGNOSIS — M25561 Pain in right knee: Secondary | ICD-10-CM | POA: Diagnosis not present

## 2021-12-13 DIAGNOSIS — R262 Difficulty in walking, not elsewhere classified: Secondary | ICD-10-CM

## 2021-12-13 NOTE — Therapy (Signed)
[facility] MAIN [facility] SERVICES [address] [place], [place], [address] Phone: [phone]   Fax:  [phone]  Physical Therapy Treatment  Patient Details  Name: Douglas Adams MRN: [id number] Date of Birth: 03/22/2001 Referring Provider (PT): Ramond Marrow   Encounter Date: 12/13/2021   PT End of Session - 12/13/21 0911     Visit Number 3    Number of Visits 16    Date for PT Re-Evaluation 01/30/22    Authorization Type 3/10 eval 12/05/21    PT Start Time 0715    PT Stop Time 0759    PT Time Calculation (min) 44 min    Equipment Utilized During Treatment Gait belt;Other (comment);Right knee immobilizer   crutches   Activity Tolerance Patient tolerated treatment well;Patient limited by pain    Behavior During Therapy [facility] for tasks assessed/performed             Past Medical History:  Diagnosis Date   Allergies     Past Surgical History:  Procedure Laterality Date   ADENOIDECTOMY AND MYRINGOTOMY WITH TUBE PLACEMENT Bilateral 2007   ANTERIOR CRUCIATE LIGAMENT REPAIR Right 12/01/2021   Procedure: RECONSTRUCTION ANTERIOR CRUCIATE LIGAMENT (ACL);  Surgeon: Bjorn Pippin, MD;  Location: [facility];  Service: Orthopedics;  Laterality: Right;    There were no vitals filed for this visit.   Subjective Assessment - 12/13/21 0910     Subjective Patient reports compliance with HEP, went to [place] and was given a good report.    Pertinent History Patient is a 21 year old male s/p ACL reconstruction with meniscal repair 12/01/21. Patient was injured with a twisting injury while playing basketball where he felt a pop and was unable to bear weight. Protocol not provided however Thereasa Parkin has protocol from clinic: knee extension lock for one week, d/c brace by 4 weeks if quad control appropriate. Progress PROM AAROM and AROM as tolerated for 0-6 weeks; with limitation of 0-90 for first 4 weeks. Still having nausea s/p surgery.    Limitations  Sitting;Lifting;Standing;Walking;House hold activities    How long can you sit comfortably? painful initially    How long can you stand comfortably? painful to stand, 5 minutes    How long can you walk comfortably? painful to walk, needs crutches    Patient Stated Goals to return to sport    Pain Score 2     Pain Location Leg    Pain Orientation Right    Pain Descriptors / Indicators Aching    Pain Type Surgical pain    Pain Onset In the past 7 days    Pain Frequency Constant                   Manual: brace unlocked to 60 degrees Knee flexion/extension PROM 10x 10 second holds Patella mobilizations 10x 5 second mobilizations each direction Knee flexion over bolster PROM 5x20 second holds   therEx: Brace unlocked to 90 degrees Nustep with UE assistance for increasing knee flexion; x 4 minutes; very challenging for patient. Seat position 10  contract relax knee flexion and extension pressing into PT shoulder 10x 5 second holds SAQ over bolster 10x with cues for quad contraction  Standing:  Walk without assistance 4x length of // bars Squat 10x; x 2 sets with occasional assistance for weight shift Lateral modified squat with UE support for weight shift and knee flexion 10x each side  No brace:  quad sets 10x 5 second holds Ice  while performing GTB 4 way ankle resistance in long sitting 10x each direction   Pt educated throughout session about proper posture and technique with exercises. Improved exercise technique, movement at target joints, use of target muscles after min to mod verbal, visual, tactile cues. Rationale for Evaluation and Treatment Rehabilitation     Patient tolerates progression of strengthening with close chained interventions well. His swelling is improved and patient is able to perform quad contraction well. Patient highly motivated for progression of care. Educated on protocol. Brace unlocked to 90 degrees, patient demonstrates safe understanding.  Patient will benefit from skilled physical therapy to reduce pain, improve ROM, improve strength and mobility for return to PLOF                  PT Education - 12/13/21 0911     Education Details exercise technique, body mechanics    Person(s) Educated Patient    Methods Explanation;Demonstration;Tactile cues;Verbal cues    Comprehension Verbalized understanding;Returned demonstration;Verbal cues required;Tactile cues required              PT Short Term Goals - 12/05/21 1712       PT SHORT TERM GOAL #1   Title Patient will be independent in home exercise program to improve strength/mobility for better functional independence with ADLs.    Baseline 6/12; HEP given    Time 4    Status New    Target Date 01/02/22      PT SHORT TERM GOAL #2   Title Patient will report a worst pain of 5/10 on VAS in R knee to improve tolerance with ADLs and reduced symptoms with activities.    Baseline 6/12: 8/10    Time 4    Period Weeks    Status New    Target Date 01/02/22      PT SHORT TERM GOAL #3   Title Patient will increase 10 meter walk test to >1.50m/s with LRAD as to improve gait speed for better community ambulation and to reduce fall risk.    Baseline 6/12:0.77 with crutches    Time 4    Period Weeks    Status New    Target Date 01/02/22      PT SHORT TERM GOAL #4   Title --    Baseline --    Time --    Period --    Status --    Target Date --      PT SHORT TERM GOAL #5   Title --    Baseline --    Time --    Period --    Status --    Target Date --               PT Long Term Goals - 12/05/21 1713       PT LONG TERM GOAL #1   Title Patient will increase FOTO score to equal to or greater than  71   to demonstrate statistically significant improvement in mobility and quality of life.    Baseline 6/12: 30    Time 8    Period Weeks    Status New    Target Date 01/30/22      PT LONG TERM GOAL #2   Title Patient will report a worst pain of  3/10 on VAS in  R knee  to improve tolerance with ADLs and reduced symptoms with activities.    Baseline 6/12: 8/10 pain    Time 8    Period  Weeks    Status New    Target Date 01/30/22      PT LONG TERM GOAL #3   Title Patient will increase lower extremity functional scale to >60/80 to demonstrate improved functional mobility and increased tolerance with ADLs.    Baseline 6/12: 19/80    Time 8    Period Weeks    Status New    Target Date 01/30/22      PT LONG TERM GOAL #4   Title Patient will improve R knee ROM (neutral to 120 degrees) for return to PLOF and safe functional mobility.    Baseline 6/12: locked in extension    Time 8    Period Weeks    Status New    Target Date 01/30/22      PT LONG TERM GOAL #5   Title Patient will increase six minute walk test distance to >1000 for progression to community ambulator and improve gait ability    Baseline 6/12: unable to test    Time 8    Period Weeks    Status New    Target Date 01/30/22                   Plan - 12/13/21 0912     Clinical Impression Statement Patient tolerates progression of strengthening with close chained interventions well. His swelling is improved and patient is able to perform quad contraction well. Patient highly motivated for progression of care. Educated on protocol. Brace unlocked to 90 degrees, patient demonstrates safe understanding. Patient will benefit from skilled physical therapy to reduce pain, improve ROM, improve strength and mobility for return to PLOF    Personal Factors and Comorbidities Profession;Transportation    Examination-Activity Limitations Bathing;Bed Mobility;Bend;Caring for Others;Carry;Dressing;Hygiene/Grooming;Lift;Locomotion Level;Toileting;Stand;Stairs;Squat;Sit;Transfers    Examination-Participation Restrictions Cleaning;Community Activity;Driving;Meal Prep;Laundry;Occupation;Shop;Yard Work;Volunteer    Stability/Clinical Decision Making Stable/Uncomplicated    Rehab  Potential Good    PT Frequency 2x / week    PT Duration 8 weeks    PT Treatment/Interventions ADLs/Self Care Home Management;Cryotherapy;Electrical Stimulation;Iontophoresis 4mg /ml Dexamethasone;Moist Heat;Gait training;DME Instruction;Stair training;Functional mobility training;Therapeutic activities;Therapeutic exercise;Patient/family education;Neuromuscular re-education;Balance training;Orthotic Fit/Training;Manual techniques;Passive range of motion;Scar mobilization;Manual lymph drainage;Dry needling;Energy conservation;Splinting;Taping    PT Next Visit Plan ROM, 6 minute walk test, 5x STS    PT Home Exercise Plan see above    Consulted and Agree with Plan of Care Patient             Patient will benefit from skilled therapeutic intervention in order to improve the following deficits and impairments:  Abnormal gait, Decreased activity tolerance, Decreased coordination, Decreased balance, Decreased endurance, Decreased range of motion, Decreased mobility, Decreased strength, Decreased scar mobility, Difficulty walking, Hypomobility, Increased edema, Impaired flexibility, Impaired perceived functional ability, Improper body mechanics, Pain  Visit Diagnosis: Acute pain of right knee  Stiffness of right knee, not elsewhere classified  Difficulty in walking, not elsewhere classified     Problem List There are no problems to display for this patient.   , PT, DPT  12/13/2021, 9:12 AM  [facility] MAIN [facility] SERVICES [address] [place], [place], [place] Phone: [phone]   Fax:  [phone]  Name: Douglas Adams MRN: [id number] Deter Date of Birth: 11/01/2000

## 2021-12-14 NOTE — Therapy (Signed)
OUTPATIENT PHYSICAL THERAPY TREATMENT NOTE   Patient Name: Douglas Adams MRN: [id number] DOB:[birth year], 21 y.o., male Today's Date: 12/15/2021  PCP: Lucio Edward MD  REFERRING PROVIDER: Lucio Edward MD   PT End of Session - 12/15/21 0721     Visit Number 4    Number of Visits 16    Date for PT Re-Evaluation 01/30/22    Authorization Type 4/10 eval 12/05/21    PT Start Time 0718    PT Stop Time 0759    PT Time Calculation (min) 41 min    Equipment Utilized During Treatment Gait belt;Other (comment);Right knee immobilizer   crutches   Activity Tolerance Patient tolerated treatment well;Patient limited by pain    Behavior During Therapy [facility] for tasks assessed/performed             Past Medical History:  Diagnosis Date   Allergies    Past Surgical History:  Procedure Laterality Date   ADENOIDECTOMY AND MYRINGOTOMY WITH TUBE PLACEMENT Bilateral 2007   ANTERIOR CRUCIATE LIGAMENT REPAIR Right 12/01/2021   Procedure: RECONSTRUCTION ANTERIOR CRUCIATE LIGAMENT (ACL);  Surgeon: Bjorn Pippin, MD;  Location: [facility];  Service: Orthopedics;  Laterality: Right;   There are no problems to display for this patient.   REFERRING DIAG: ACL reconstruction and arthroscopy of R knee  THERAPY DIAG:  Acute pain of right knee  Stiffness of right knee, not elsewhere classified  Difficulty in walking, not elsewhere classified  Rationale for Evaluation and Treatment Rehabilitation  PERTINENT HISTORY: Patient is a 21 year old male s/p ACL reconstruction with meniscal repair 12/01/21. Patient was injured with a twisting injury while playing basketball where he felt a pop and was unable to bear weight. Protocol not provided however Thereasa Parkin has protocol from clinic: knee extension lock for one week, d/c brace by 4 weeks if quad control appropriate. Progress PROM AAROM and AROM as tolerated for 0-6 weeks; with limitation of 0-90 for first 4 weeks. Still having nausea s/p  surgery.   PRECAUTIONS: ACL protocol  SUBJECTIVE: Patient reports he has been feeling stiff but not painful. Practicing walking with and without crutches. Presents with single crutch. Went to church last night and went out to eat afterwards   PAIN:  Are you having pain? Yes: NPRS scale: 5/10 Pain location: R knee Pain description: post surgical Aggravating factors: movement Relieving factors: elevating, ice, rest     TODAY'S TREATMENT:    Manual: brace unlocked to 60 degrees Knee flexion/extension PROM 10x 10 second holds; performed in prone this session  Patella mobilizations 10x 5 second mobilizations each direction   therEx: Brace unlocked to 90 degrees Nustep with UE assistance for increasing knee flexion; x 4 minutes; very challenging for patient. Seat position 10  contract relax knee flexion and extension pressing into PT shoulder 10x 5 second holds ad contraction   Standing:  Squat 10x; x 2 sets with occasional assistance for weight shift; second set with dynadisc under non surgical limb  Lateral modified squat with UE support for weight shift and knee flexion 10x each side Single limb stance on surgical limb 30 seconds with UE support    No brace:  quad sets 10x 5 second holds with E stim in 4 point pattern: Burst 1: 200 mz x4 minutes  Ice while performing ankle alphabet   PATIENT EDUCATION: Education details: exercise technique, body mechanics, protocol Person educated: Patient Education method: Explanation, Demonstration, Tactile cues, and Verbal cues Education comprehension: verbalized understanding, returned demonstration, verbal cues  required, and tactile cues required   HOME EXERCISE PROGRAM: Access Code: [email or web address] URL: [email or web address] Date: 12/15/2021 Prepared by: Precious Bard  Exercises - Long Sitting Quad Set  - 1 x daily - 7 x weekly - 2 sets - 10 reps - 5 hold - Active Straight Leg Raise with Quad Set  - 1 x daily - 7 x weekly  - 2 sets - 10 reps - 5 hold - Mini Squat with Counter Support  - 1 x daily - 7 x weekly - 2 sets - 10 reps - 5 hold - Side Lunge with Counter Support  - 1 x daily - 7 x weekly - 2 sets - 10 reps - 5 hold - Seated Ankle Alphabet  - 1 x daily - 7 x weekly - 2 sets - 10 reps - 5 hold   PT Short Term Goals       PT SHORT TERM GOAL #1   Title Patient will be independent in home exercise program to improve strength/mobility for better functional independence with ADLs.    Baseline 6/12; HEP given    Time 4    Status New    Target Date 01/02/22      PT SHORT TERM GOAL #2   Title Patient will report a worst pain of 5/10 on VAS in R knee to improve tolerance with ADLs and reduced symptoms with activities.    Baseline 6/12: 8/10    Time 4    Period Weeks    Status New    Target Date 01/02/22      PT SHORT TERM GOAL #3   Title Patient will increase 10 meter walk test to >1.12m/s with LRAD as to improve gait speed for better community ambulation and to reduce fall risk.    Baseline 6/12:0.77 with crutches    Time 4    Period Weeks    Status New    Target Date 01/02/22      PT SHORT TERM GOAL #4   Title --    Baseline --    Time --    Period --    Status --    Target Date --      PT SHORT TERM GOAL #5   Title --    Baseline --    Time --    Period --    Status --    Target Date --              PT Long Term Goals      PT LONG TERM GOAL #1   Title Patient will increase FOTO score to equal to or greater than  71   to demonstrate statistically significant improvement in mobility and quality of life.    Baseline 6/12: 30    Time 8    Period Weeks    Status New    Target Date 01/30/22      PT LONG TERM GOAL #2   Title Patient will report a worst pain of 3/10 on VAS in  R knee  to improve tolerance with ADLs and reduced symptoms with activities.    Baseline 6/12: 8/10 pain    Time 8    Period Weeks    Status New    Target Date 01/30/22      PT LONG TERM GOAL #3    Title Patient will increase lower extremity functional scale to >60/80 to demonstrate improved functional mobility and increased tolerance with ADLs.    Baseline 6/12: 19/80  Time 8    Period Weeks    Status New    Target Date 01/30/22      PT LONG TERM GOAL #4   Title Patient will improve R knee ROM (neutral to 120 degrees) for return to PLOF and safe functional mobility.    Baseline 6/12: locked in extension    Time 8    Period Weeks    Status New    Target Date 01/30/22      PT LONG TERM GOAL #5   Title Patient will increase six minute walk test distance to >1000 for progression to community ambulator and improve gait ability    Baseline 6/12: unable to test    Time 8    Period Weeks    Status New    Target Date 01/30/22              Plan     Clinical Impression Statement Patient educated on use of E stim for quad contraction in combination with quad set. Tolerated E stim well. Does have intermittent knee hyperextension that requires external cueing for correction. Progression of stabilization and strengthening interventions tolerated well. Patient will benefit from skilled physical therapy to reduce pain, improve ROM, improve strength and mobility for return to PLOF    Personal Factors and Comorbidities Profession;Transportation    Examination-Activity Limitations Bathing;Bed Mobility;Bend;Caring for Others;Carry;Dressing;Hygiene/Grooming;Lift;Locomotion Level;Toileting;Stand;Stairs;Squat;Sit;Transfers    Examination-Participation Restrictions Cleaning;Community Activity;Driving;Meal Prep;Laundry;Occupation;Shop;Yard Work;Volunteer    Stability/Clinical Decision Making Stable/Uncomplicated    Rehab Potential Good    PT Frequency 2x / week    PT Duration 8 weeks    PT Treatment/Interventions ADLs/Self Care Home Management;Cryotherapy;Electrical Stimulation;Iontophoresis 4mg /ml Dexamethasone;Moist Heat;Gait training;DME Instruction;Stair training;Functional mobility  training;Therapeutic activities;Therapeutic exercise;Patient/family education;Neuromuscular re-education;Balance training;Orthotic Fit/Training;Manual techniques;Passive range of motion;Scar mobilization;Manual lymph drainage;Dry needling;Energy conservation;Splinting;Taping    PT Next Visit Plan quad control, e stim    PT Home Exercise Plan see above    Consulted and Agree with Plan of Care Patient            , PT, DPT  12/15/2021, 1:10 PM

## 2021-12-15 ENCOUNTER — Ambulatory Visit: Payer: 59

## 2021-12-15 DIAGNOSIS — M25561 Pain in right knee: Secondary | ICD-10-CM

## 2021-12-15 DIAGNOSIS — M25661 Stiffness of right knee, not elsewhere classified: Secondary | ICD-10-CM

## 2021-12-15 DIAGNOSIS — R262 Difficulty in walking, not elsewhere classified: Secondary | ICD-10-CM

## 2021-12-20 ENCOUNTER — Ambulatory Visit: Payer: 59

## 2021-12-20 DIAGNOSIS — M25661 Stiffness of right knee, not elsewhere classified: Secondary | ICD-10-CM

## 2021-12-20 DIAGNOSIS — M25561 Pain in right knee: Secondary | ICD-10-CM

## 2021-12-20 DIAGNOSIS — R262 Difficulty in walking, not elsewhere classified: Secondary | ICD-10-CM

## 2021-12-22 ENCOUNTER — Ambulatory Visit: Payer: 59

## 2021-12-22 DIAGNOSIS — M25661 Stiffness of right knee, not elsewhere classified: Secondary | ICD-10-CM

## 2021-12-22 DIAGNOSIS — M25561 Pain in right knee: Secondary | ICD-10-CM

## 2021-12-22 DIAGNOSIS — R262 Difficulty in walking, not elsewhere classified: Secondary | ICD-10-CM

## 2021-12-22 NOTE — Therapy (Signed)
OUTPATIENT PHYSICAL THERAPY TREATMENT NOTE   Patient Name: Douglas Adams MRN: [id number] DOB:Feb 16, 2001, 21 y.o., male Today's Date: 12/22/2021  PCP: Lucio Edward MD  REFERRING PROVIDER: Lucio Edward MD   PT End of Session - 12/22/21 0724     Visit Number 6    Number of Visits 16    Date for PT Re-Evaluation 01/30/22    Authorization Type [employer]    Authorization Time Period 12/05/21-01/30/22    PT Start Time 0717    PT Stop Time 0757    PT Time Calculation (min) 40 min    Equipment Utilized During Treatment Other (comment);Right knee immobilizer    Activity Tolerance Patient tolerated treatment well;Patient limited by pain    Behavior During Therapy [facility] for tasks assessed/performed              Past Medical History:  Diagnosis Date   Allergies    Past Surgical History:  Procedure Laterality Date   ADENOIDECTOMY AND MYRINGOTOMY WITH TUBE PLACEMENT Bilateral 2007   ANTERIOR CRUCIATE LIGAMENT REPAIR Right 12/01/2021   Procedure: RECONSTRUCTION ANTERIOR CRUCIATE LIGAMENT (ACL);  Surgeon: Bjorn Pippin, MD;  Location: [facility];  Service: Orthopedics;  Laterality: Right;   There are no problems to display for this patient.   REFERRING DIAG: ACL reconstruction and arthroscopy of R knee  THERAPY DIAG:  Acute pain of right knee  Stiffness of right knee, not elsewhere classified  Difficulty in walking, not elsewhere classified  Rationale for Evaluation and Treatment Rehabilitation  PERTINENT HISTORY: Patient is a 21 year old male s/p ACL reconstruction with meniscal repair 12/01/21. Patient was injured with a twisting injury while playing basketball where he felt a pop and was unable to bear weight. Protocol not provided however Thereasa Parkin has protocol from clinic: knee extension lock for one week, d/c brace by 4 weeks if quad control appropriate. Progress PROM AAROM and AROM as tolerated for 0-6 weeks; with limitation of 0-90 for first 4 weeks.  Still having nausea s/p surgery.   PRECAUTIONS: ACL protocol   Subjective Assessment - 12/22/21 0723     Subjective Pt reports some stiffmess in hip from prolonged sitting, have some difficulty with SLR at home due to pain still.    Pertinent History Patient is a 21 year old male s/p ACL reconstruction with meniscal repair 12/01/21. Patient was injured with a twisting injury while playing basketball where he felt a pop and was unable to bear weight. Protocol not provided however Thereasa Parkin has protocol from clinic: knee extension lock for one week, d/c brace by 4 weeks if quad control appropriate. Progress PROM AAROM and AROM as tolerated for 0-6 weeks; with limitation of 0-90 for first 4 weeks. Still having nausea s/p surgery.    Currently in Pain? No/denies              TODAY'S TREATMENT 12/22/21:   -seated RLE heel slides x20 -supine RLE heel slides x20 x gaitbelt for active assist as needed (heavy stiffness limitations>pain)  -Quad sets 1x30 TENS applied halfway; generlly poor activation of quads, very hip extension dominant strategy, improves with extensive practice, feedback, use of Left leg as a teaching aid.  -LLLD stretching knee flexion 5x90sec progression from 35-42 degrees, concurrent TENS -intermittent SAQ AA/ROM 5x10 (clear onset fatigue toward end of set)  -hamstrings bridge 2x15, heels/distal calves on traction bench (slight knee flexion)    PATIENT EDUCATION: Education details: LLLD stretching setup at home; knee brace fitting/alignment  Person educated:  Patient Education method: Explanation, Demonstration, Tactile cues, and Verbal cues Education comprehension: verbalized understanding, returned demonstration, verbal cues required, and tactile cues required   HOME EXERCISE PROGRAM: Access Code: [email or web address] URL: [email or web address] Date: 12/15/2021 Prepared by: Precious Bard  Exercises - Long Sitting Quad Set  - 1 x daily - 7 x weekly - 2 sets - 10 reps - 5  hold - Active Straight Leg Raise with Quad Set  - 1 x daily - 7 x weekly - 2 sets - 10 reps - 5 hold - Mini Squat with Counter Support  - 1 x daily - 7 x weekly - 2 sets - 10 reps - 5 hold - Side Lunge with Counter Support  - 1 x daily - 7 x weekly - 2 sets - 10 reps - 5 hold - Seated Ankle Alphabet  - 1 x daily - 7 x weekly - 2 sets - 10 reps - 5 hold   PT Short Term Goals       PT SHORT TERM GOAL #1   Title Patient will be independent in home exercise program to improve strength/mobility for better functional independence with ADLs.    Baseline 6/12; HEP given    Time 4    Status New    Target Date 01/02/22      PT SHORT TERM GOAL #2   Title Patient will report a worst pain of 5/10 on VAS in R knee to improve tolerance with ADLs and reduced symptoms with activities.    Baseline 6/12: 8/10    Time 4    Period Weeks    Status New    Target Date 01/02/22      PT SHORT TERM GOAL #3   Title Patient will increase 10 meter walk test to >1.92m/s with LRAD as to improve gait speed for better community ambulation and to reduce fall risk.    Baseline 6/12:0.77 with crutches    Time 4    Period Weeks    Status New    Target Date 01/02/22      PT SHORT TERM GOAL #4   Title --    Baseline --    Time --    Period --    Status --    Target Date --      PT SHORT TERM GOAL #5   Title --    Baseline --    Time --    Period --    Status --    Target Date --              PT Long Term Goals      PT LONG TERM GOAL #1   Title Patient will increase FOTO score to equal to or greater than  71   to demonstrate statistically significant improvement in mobility and quality of life.    Baseline 6/12: 30    Time 8    Period Weeks    Status New    Target Date 01/30/22      PT LONG TERM GOAL #2   Title Patient will report a worst pain of 3/10 on VAS in  R knee  to improve tolerance with ADLs and reduced symptoms with activities.    Baseline 6/12: 8/10 pain    Time 8    Period  Weeks    Status New    Target Date 01/30/22      PT LONG TERM GOAL #3   Title Patient will increase lower extremity functional scale  to >60/80 to demonstrate improved functional mobility and increased tolerance with ADLs.    Baseline 6/12: 19/80    Time 8    Period Weeks    Status New    Target Date 01/30/22      PT LONG TERM GOAL #4   Title Patient will improve R knee ROM (neutral to 120 degrees) for return to PLOF and safe functional mobility.    Baseline 6/12: locked in extension    Time 8    Period Weeks    Status New    Target Date 01/30/22      PT LONG TERM GOAL #5   Title Patient will increase six minute walk test distance to >1000 for progression to community ambulator and improve gait ability    Baseline 6/12: unable to test    Time 8    Period Weeks    Status New    Target Date 01/30/22             Plan - 12/22/21 0756     Clinical Impression Statement Continued focus on gentle ROM and quads activation. Postoperative knee remains frozen, resistant and painful to flexion- quads on Rt rampant with neurological inhibition as is typical in this situation. Introduced low-load long-duration stretching knee flexion today, instructions to begin incorporating at home. Empirical electrical modalities used to address neuroinhibition of quads. Pain well tolerated. Pt educated on brace fitting correction, unable to fully correct due to time, but pt is educated in this area, will attempt to address at home.    Personal Factors and Comorbidities Profession;Transportation    Examination-Activity Limitations Bathing;Bed Mobility;Bend;Caring for Others;Carry;Dressing;Hygiene/Grooming;Lift;Locomotion Level;Toileting;Stand;Stairs;Squat;Sit;Transfers    Examination-Participation Restrictions Cleaning;Community Activity;Driving;Meal Prep;Laundry;Occupation;Shop;Yard Work;Volunteer    Stability/Clinical Decision Making Stable/Uncomplicated    Clinical Decision Making Low    Rehab  Potential Good    PT Frequency 2x / week    PT Duration 8 weeks    PT Treatment/Interventions ADLs/Self Care Home Management;Cryotherapy;Electrical Stimulation;Iontophoresis 4mg /ml Dexamethasone;Moist Heat;Gait training;DME Instruction;Stair training;Functional mobility training;Therapeutic activities;Therapeutic exercise;Patient/family education;Neuromuscular re-education;Balance training;Orthotic Fit/Training;Manual techniques;Passive range of motion;Scar mobilization;Manual lymph drainage;Dry needling;Energy conservation;Splinting;Taping    PT Next Visit Plan Work on quads lag, electricity, gentle LLLD knee flexion.    PT Home Exercise Plan No updates since.    Consulted and Agree with Plan of Care Patient                 8:16 AM, 12/22/21 12/24/21, PT, DPT Physical Therapist - [facility]  Outpatient Physical Therapy- Main Campus [phone]     12/22/2021, 8:15 AM

## 2021-12-26 ENCOUNTER — Ambulatory Visit: Payer: 59 | Attending: Orthopaedic Surgery

## 2021-12-26 DIAGNOSIS — M25561 Pain in right knee: Secondary | ICD-10-CM | POA: Diagnosis not present

## 2021-12-26 DIAGNOSIS — M25661 Stiffness of right knee, not elsewhere classified: Secondary | ICD-10-CM | POA: Diagnosis present

## 2021-12-26 DIAGNOSIS — R262 Difficulty in walking, not elsewhere classified: Secondary | ICD-10-CM | POA: Insufficient documentation

## 2021-12-26 NOTE — Therapy (Incomplete)
OUTPATIENT PHYSICAL THERAPY TREATMENT NOTE   Patient Name: Douglas Adams MRN: [id number] DOB:[birth year], 21 y.o., male Today's Date: 12/29/2021  PCP: Lucio Edward MD  REFERRING PROVIDER: Lucio Edward MD   PT End of Session - 12/28/21 1605     Visit Number 8    Number of Visits 16    Date for PT Re-Evaluation 01/30/22    Authorization Type [employer]    Authorization Time Period 12/05/21-01/30/22    PT Start Time 1600    PT Stop Time 1644    PT Time Calculation (min) 44 min    Equipment Utilized During Treatment Other (comment);Right knee immobilizer    Activity Tolerance Patient tolerated treatment well;Patient limited by pain    Behavior During Therapy [facility] for tasks assessed/performed                Past Medical History:  Diagnosis Date   Allergies    Past Surgical History:  Procedure Laterality Date   ADENOIDECTOMY AND MYRINGOTOMY WITH TUBE PLACEMENT Bilateral 2007   ANTERIOR CRUCIATE LIGAMENT REPAIR Right 12/01/2021   Procedure: RECONSTRUCTION ANTERIOR CRUCIATE LIGAMENT (ACL);  Surgeon: Bjorn Pippin, MD;  Location: [facility];  Service: Orthopedics;  Laterality: Right;   There are no problems to display for this patient.   REFERRING DIAG: ACL reconstruction and arthroscopy of R knee  THERAPY DIAG:  Acute pain of right knee  Stiffness of right knee, not elsewhere classified  Difficulty in walking, not elsewhere classified  Rationale for Evaluation and Treatment Rehabilitation  PERTINENT HISTORY: Patient is a 21 year old male s/p ACL reconstruction with meniscal repair 12/01/21. Patient was injured with a twisting injury while playing basketball where he felt a pop and was unable to bear weight. Protocol not provided however Thereasa Parkin has protocol from clinic: knee extension lock for one week, d/c brace by 4 weeks if quad control appropriate. Progress PROM AAROM and AROM as tolerated for 0-6 weeks; with limitation of 0-90 for first 4 weeks.  Still having nausea s/p surgery.   PRECAUTIONS: ACL protocol  SUBJECTIVE: Patient is leaving for beach this weekend. See his physician on Friday.   PAIN:  Are you having pain? Yes: NPRS scale: 4-5/10 Pain location: R knee Pain description: post surgical Aggravating factors: movement Relieving factors: elevating, ice, rest     TODAY'S TREATMENT:    Manual:  Knee flexion/extension PROM 10x 10 second holds; performed in prone this session  Patella mobilizations 15x 5 second mobilizations each direction   therEx: Brace on Nustep with UE assistance for increasing knee flexion; x 4 minutes; very challenging for patient. Seat position 10  Seated: Contract relax against PT with increasing knee flexion: very challenging with quad activation for first 2 sets, performed with hamstring activation technique with improved results  Supine: SLR: 10x; 2 sets Quad set 10x with focus on quad activation rather than compensatory strategies x2 sets Hamstring curl with swiss ball for increasing knee flexion; stabilization provided by PT 10x  GTB 4 way ankle while icing knee 10x each direction  Standing:  Squat 10x; x 2 sets with occasional assistance for weight shift; cue for hold for 5 seconds and deepen squat position Knee flexion onto 4" step alternating with extension 10x      PATIENT EDUCATION: Education details: exercise technique, body mechanics, protocol Person educated: Patient Education method: Explanation, Demonstration, Tactile cues, and Verbal cues Education comprehension: verbalized understanding, returned demonstration, verbal cues required, and tactile cues required   HOME EXERCISE  PROGRAM: Access Code: [email or web address] URL: [email or web address] Date: 12/15/2021 Prepared by: Precious Bard  Exercises - Long Sitting Quad Set  - 1 x daily - 7 x weekly - 2 sets - 10 reps - 5 hold - Active Straight Leg Raise with Quad Set  - 1 x daily - 7 x weekly - 2 sets - 10 reps  - 5 hold - Mini Squat with Counter Support  - 1 x daily - 7 x weekly - 2 sets - 10 reps - 5 hold - Side Lunge with Counter Support  - 1 x daily - 7 x weekly - 2 sets - 10 reps - 5 hold - Seated Ankle Alphabet  - 1 x daily - 7 x weekly - 2 sets - 10 reps - 5 hold   PT Short Term Goals       PT SHORT TERM GOAL #1   Title Patient will be independent in home exercise program to improve strength/mobility for better functional independence with ADLs.    Baseline 6/12; HEP given    Time 4    Status New    Target Date 01/02/22      PT SHORT TERM GOAL #2   Title Patient will report a worst pain of 5/10 on VAS in R knee to improve tolerance with ADLs and reduced symptoms with activities.    Baseline 6/12: 8/10    Time 4    Period Weeks    Status New    Target Date 01/02/22      PT SHORT TERM GOAL #3   Title Patient will increase 10 meter walk test to >1.36m/s with LRAD as to improve gait speed for better community ambulation and to reduce fall risk.    Baseline 6/12:0.77 with crutches    Time 4    Period Weeks    Status New    Target Date 01/02/22      PT SHORT TERM GOAL #4   Title --    Baseline --    Time --    Period --    Status --    Target Date --      PT SHORT TERM GOAL #5   Title --    Baseline --    Time --    Period --    Status --    Target Date --              PT Long Term Goals      PT LONG TERM GOAL #1   Title Patient will increase FOTO score to equal to or greater than  71   to demonstrate statistically significant improvement in mobility and quality of life.    Baseline 6/12: 30    Time 8    Period Weeks    Status New    Target Date 01/30/22      PT LONG TERM GOAL #2   Title Patient will report a worst pain of 3/10 on VAS in  R knee  to improve tolerance with ADLs and reduced symptoms with activities.    Baseline 6/12: 8/10 pain    Time 8    Period Weeks    Status New    Target Date 01/30/22      PT LONG TERM GOAL #3   Title Patient will  increase lower extremity functional scale to >60/80 to demonstrate improved functional mobility and increased tolerance with ADLs.    Baseline 6/12: 19/80    Time 8    Period  Weeks    Status New    Target Date 01/30/22      PT LONG TERM GOAL #4   Title Patient will improve R knee ROM (neutral to 120 degrees) for return to PLOF and safe functional mobility.    Baseline 6/12: locked in extension    Time 8    Period Weeks    Status New    Target Date 01/30/22      PT LONG TERM GOAL #5   Title Patient will increase six minute walk test distance to >1000 for progression to community ambulator and improve gait ability    Baseline 6/12: unable to test    Time 8    Period Weeks    Status New    Target Date 01/30/22              Plan     Clinical Impression Statement Patient is able to flex knee with hamstring contraction focus better than when passively performed by PT due to extensive guarding and limited control of muscle. Patient continues to be limited by quad control during functional activities requiring multimodal cueing. Patient will benefit from skilled physical therapy to reduce pain, improve ROM, improve strength and mobility for return to PLOF    Personal Factors and Comorbidities Profession;Transportation    Examination-Activity Limitations Bathing;Bed Mobility;Bend;Caring for Others;Carry;Dressing;Hygiene/Grooming;Lift;Locomotion Level;Toileting;Stand;Stairs;Squat;Sit;Transfers    Examination-Participation Restrictions Cleaning;Community Activity;Driving;Meal Prep;Laundry;Occupation;Shop;Yard Work;Volunteer    Stability/Clinical Decision Making Stable/Uncomplicated    Rehab Potential Good    PT Frequency 2x / week    PT Duration 8 weeks    PT Treatment/Interventions ADLs/Self Care Home Management;Cryotherapy;Electrical Stimulation;Iontophoresis 4mg /ml Dexamethasone;Moist Heat;Gait training;DME Instruction;Stair training;Functional mobility training;Therapeutic  activities;Therapeutic exercise;Patient/family education;Neuromuscular re-education;Balance training;Orthotic Fit/Training;Manual techniques;Passive range of motion;Scar mobilization;Manual lymph drainage;Dry needling;Energy conservation;Splinting;Taping    PT Next Visit Plan quad control, e stim    PT Home Exercise Plan see above    Consulted and Agree with Plan of Care Patient            , PT, DPT  12/29/2021, 7:20 AM

## 2021-12-26 NOTE — Therapy (Signed)
OUTPATIENT PHYSICAL THERAPY TREATMENT NOTE   Patient Name: Douglas Adams MRN: [id number] DOB:2000-11-21, 21 y.o., male Today's Date: 12/26/2021  PCP: Lucio Edward MD  REFERRING PROVIDER: Lucio Edward MD   PT End of Session - 12/26/21 1604     Visit Number 7    Number of Visits 16    Date for PT Re-Evaluation 01/30/22    Authorization Type [employer]    Authorization Time Period 12/05/21-01/30/22    PT Start Time 1600    PT Stop Time 1644    PT Time Calculation (min) 44 min    Equipment Utilized During Treatment Other (comment);Right knee immobilizer    Activity Tolerance Patient tolerated treatment well;Patient limited by pain    Behavior During Therapy [facility] for tasks assessed/performed               Past Medical History:  Diagnosis Date   Allergies    Past Surgical History:  Procedure Laterality Date   ADENOIDECTOMY AND MYRINGOTOMY WITH TUBE PLACEMENT Bilateral 2007   ANTERIOR CRUCIATE LIGAMENT REPAIR Right 12/01/2021   Procedure: RECONSTRUCTION ANTERIOR CRUCIATE LIGAMENT (ACL);  Surgeon: Bjorn Pippin, MD;  Location: [facility];  Service: Orthopedics;  Laterality: Right;   There are no problems to display for this patient.   REFERRING DIAG: ACL reconstruction and arthroscopy of R knee  THERAPY DIAG:  Acute pain of right knee  Stiffness of right knee, not elsewhere classified  Difficulty in walking, not elsewhere classified  Rationale for Evaluation and Treatment Rehabilitation  PERTINENT HISTORY: Patient is a 21 year old male s/p ACL reconstruction with meniscal repair 12/01/21. Patient was injured with a twisting injury while playing basketball where he felt a pop and was unable to bear weight. Protocol not provided however Thereasa Parkin has protocol from clinic: knee extension lock for one week, d/c brace by 4 weeks if quad control appropriate. Progress PROM AAROM and AROM as tolerated for 0-6 weeks; with limitation of 0-90 for first 4 weeks.  Still having nausea s/p surgery.   PRECAUTIONS: ACL protocol  SUBJECTIVE: Patient reports he is able to bend his knee better. Has been practicing flexing his knee.   PAIN:  Are you having pain? Yes: NPRS scale: 4-5/10 Pain location: R knee Pain description: post surgical Aggravating factors: movement Relieving factors: elevating, ice, rest     TODAY'S TREATMENT:    Manual:  Knee flexion/extension PROM 10x 10 second holds; performed in prone this session  Patella mobilizations 15x 5 second mobilizations each direction   therEx: Brace on Nustep with UE assistance for increasing knee flexion; x 4 minutes; very challenging for patient. Seat position 10  Supine: SLR: 10x; 2 sets with first set requiring AAROM, second set AROM  Standing:  Squat 10x; x 2 sets with occasional assistance for weight shift; cue for hold for 5 seconds and deepen squat position Knee flexion onto 4" step alternating with extension 10x      Trigger Point Dry Needling (TDN), unbilled Education performed with patient regarding potential benefit of TDN. Reviewed precautions and risks with patient. Reviewed special precautions/risks over lung fields which include pneumothorax. Reviewed signs and symptoms of pneumothorax and advised pt to go to ER immediately if these symptoms develop advise them of dry needling treatment. Extensive time spent with pt to ensure full understanding of TDN risks. Pt provided verbal consent to treatment. TDN performed to  with 0.25 x 40 single needle placements with local twitch response (LTR). Pistoning technique utilized. Improved pain-free  motion following intervention. Medial quad x 2 minutes   PATIENT EDUCATION: Education details: exercise technique, body mechanics, protocol Person educated: Patient Education method: Explanation, Demonstration, Tactile cues, and Verbal cues Education comprehension: verbalized understanding, returned demonstration, verbal cues required, and  tactile cues required   HOME EXERCISE PROGRAM: Access Code: [email or web address] URL: [email or web address] Date: 12/15/2021 Prepared by: Precious Bard  Exercises - Long Sitting Quad Set  - 1 x daily - 7 x weekly - 2 sets - 10 reps - 5 hold - Active Straight Leg Raise with Quad Set  - 1 x daily - 7 x weekly - 2 sets - 10 reps - 5 hold - Mini Squat with Counter Support  - 1 x daily - 7 x weekly - 2 sets - 10 reps - 5 hold - Side Lunge with Counter Support  - 1 x daily - 7 x weekly - 2 sets - 10 reps - 5 hold - Seated Ankle Alphabet  - 1 x daily - 7 x weekly - 2 sets - 10 reps - 5 hold   PT Short Term Goals       PT SHORT TERM GOAL #1   Title Patient will be independent in home exercise program to improve strength/mobility for better functional independence with ADLs.    Baseline 6/12; HEP given    Time 4    Status New    Target Date 01/02/22      PT SHORT TERM GOAL #2   Title Patient will report a worst pain of 5/10 on VAS in R knee to improve tolerance with ADLs and reduced symptoms with activities.    Baseline 6/12: 8/10    Time 4    Period Weeks    Status New    Target Date 01/02/22      PT SHORT TERM GOAL #3   Title Patient will increase 10 meter walk test to >1.3m/s with LRAD as to improve gait speed for better community ambulation and to reduce fall risk.    Baseline 6/12:0.77 with crutches    Time 4    Period Weeks    Status New    Target Date 01/02/22      PT SHORT TERM GOAL #4   Title --    Baseline --    Time --    Period --    Status --    Target Date --      PT SHORT TERM GOAL #5   Title --    Baseline --    Time --    Period --    Status --    Target Date --              PT Long Term Goals      PT LONG TERM GOAL #1   Title Patient will increase FOTO score to equal to or greater than  71   to demonstrate statistically significant improvement in mobility and quality of life.    Baseline 6/12: 30    Time 8    Period Weeks    Status New     Target Date 01/30/22      PT LONG TERM GOAL #2   Title Patient will report a worst pain of 3/10 on VAS in  R knee  to improve tolerance with ADLs and reduced symptoms with activities.    Baseline 6/12: 8/10 pain    Time 8    Period Weeks    Status New    Target Date  01/30/22      PT LONG TERM GOAL #3   Title Patient will increase lower extremity functional scale to >60/80 to demonstrate improved functional mobility and increased tolerance with ADLs.    Baseline 6/12: 19/80    Time 8    Period Weeks    Status New    Target Date 01/30/22      PT LONG TERM GOAL #4   Title Patient will improve R knee ROM (neutral to 120 degrees) for return to PLOF and safe functional mobility.    Baseline 6/12: locked in extension    Time 8    Period Weeks    Status New    Target Date 01/30/22      PT LONG TERM GOAL #5   Title Patient will increase six minute walk test distance to >1000 for progression to community ambulator and improve gait ability    Baseline 6/12: unable to test    Time 8    Period Weeks    Status New    Target Date 01/30/22              Plan     Clinical Impression Statement Patient is highly motivated throughout physical therapy session. He continues to have difficulty with muscle activation of quadriceps musculature. Patient is able to perform straight leg raise when not looking at leg.  Patient will benefit from skilled physical therapy to reduce pain, improve ROM, improve strength and mobility for return to PLOF    Personal Factors and Comorbidities Profession;Transportation    Examination-Activity Limitations Bathing;Bed Mobility;Bend;Caring for Others;Carry;Dressing;Hygiene/Grooming;Lift;Locomotion Level;Toileting;Stand;Stairs;Squat;Sit;Transfers    Examination-Participation Restrictions Cleaning;Community Activity;Driving;Meal Prep;Laundry;Occupation;Shop;Yard Work;Volunteer    Stability/Clinical Decision Making Stable/Uncomplicated    Rehab Potential Good     PT Frequency 2x / week    PT Duration 8 weeks    PT Treatment/Interventions ADLs/Self Care Home Management;Cryotherapy;Electrical Stimulation;Iontophoresis 4mg /ml Dexamethasone;Moist Heat;Gait training;DME Instruction;Stair training;Functional mobility training;Therapeutic activities;Therapeutic exercise;Patient/family education;Neuromuscular re-education;Balance training;Orthotic Fit/Training;Manual techniques;Passive range of motion;Scar mobilization;Manual lymph drainage;Dry needling;Energy conservation;Splinting;Taping    PT Next Visit Plan quad control, e stim    PT Home Exercise Plan see above    Consulted and Agree with Plan of Care Patient            , PT, DPT  12/26/2021, 7:53 PM

## 2021-12-28 ENCOUNTER — Ambulatory Visit: Payer: 59

## 2021-12-28 DIAGNOSIS — M25561 Pain in right knee: Secondary | ICD-10-CM | POA: Diagnosis not present

## 2021-12-28 DIAGNOSIS — R262 Difficulty in walking, not elsewhere classified: Secondary | ICD-10-CM

## 2021-12-28 DIAGNOSIS — M25661 Stiffness of right knee, not elsewhere classified: Secondary | ICD-10-CM

## 2022-01-02 ENCOUNTER — Ambulatory Visit: Payer: 59

## 2022-01-04 ENCOUNTER — Ambulatory Visit: Payer: 59

## 2022-01-09 NOTE — Therapy (Signed)
OUTPATIENT PHYSICAL THERAPY TREATMENT NOTE   Patient Name: Douglas Adams MRN: [id number] DOB:2001/02/15, 21 y.o., male Today's Date: 01/10/2022  PCP: Lucio Edward MD  REFERRING PROVIDER: Lucio Edward MD   PT End of Session - 01/10/22 1558     Visit Number 9    Number of Visits 16    Date for PT Re-Evaluation 01/30/22    Authorization Type [employer]    Authorization Time Period 12/05/21-01/30/22    PT Start Time 1600    PT Stop Time 1644    PT Time Calculation (min) 44 min    Equipment Utilized During Treatment Other (comment);Right knee immobilizer    Activity Tolerance Patient tolerated treatment well;Patient limited by pain    Behavior During Therapy [facility] for tasks assessed/performed                 Past Medical History:  Diagnosis Date   Allergies    Past Surgical History:  Procedure Laterality Date   ADENOIDECTOMY AND MYRINGOTOMY WITH TUBE PLACEMENT Bilateral 2007   ANTERIOR CRUCIATE LIGAMENT REPAIR Right 12/01/2021   Procedure: RECONSTRUCTION ANTERIOR CRUCIATE LIGAMENT (ACL);  Surgeon: Bjorn Pippin, MD;  Location: [facility];  Service: Orthopedics;  Laterality: Right;   There are no problems to display for this patient.   REFERRING DIAG: ACL reconstruction and arthroscopy of R knee  THERAPY DIAG:  Acute pain of right knee  Stiffness of right knee, not elsewhere classified  Difficulty in walking, not elsewhere classified  Rationale for Evaluation and Treatment Rehabilitation  PERTINENT HISTORY: Patient is a 21 year old male s/p ACL reconstruction with meniscal repair 12/01/21. Patient was injured with a twisting injury while playing basketball where he felt a pop and was unable to bear weight. Protocol not provided however Thereasa Parkin has protocol from clinic: knee extension lock for one week, d/c brace by 4 weeks if quad control appropriate. Progress PROM AAROM and AROM as tolerated for 0-6 weeks; with limitation of 0-90 for first 4  weeks. Still having nausea s/p surgery.   PRECAUTIONS: ACL protocol  SUBJECTIVE: Patient returning from beach trip. Has seen physician and is weaning from brace. Was told if he does not improve flexion will need additional procedure.   PAIN:  Are you having pain? Yes: NPRS scale: 4-5/10 Pain location: R knee Pain description: post surgical Aggravating factors: movement Relieving factors: elevating, ice, rest     TODAY'S TREATMENT:    Manual:  Knee flexion/extension PROM 10x 10 second holds; performed in prone this session  Knee flexion PROM 10x 10 seconds supine  Patella mobilizations 15x 5 second mobilizations each direction   therEx:  Bicycle: forward 10x, backwards 10x; 2 sets ; (added to HEP)   Seated: Contract relax against PT with increasing knee flexion: very challenging with quad activation for first 2 sets, performed with hamstring activation technique with improved results  Supine:  Quad set 10x with focus on quad activation rather than compensatory strategies ; hold 5 seconds GTB 4 way ankle while icing knee 10x each direction  Standing:  Squat 10x;  sets with occasional assistance for weight shift; cue for hold for 5 seconds and deepen squat position Single limb modified sit to stand 10x from raised plinth    PATIENT EDUCATION: Education details: exercise technique, body mechanics, protocol Person educated: Patient Education method: Explanation, Demonstration, Tactile cues, and Verbal cues Education comprehension: verbalized understanding, returned demonstration, verbal cues required, and tactile cues required   HOME EXERCISE PROGRAM: Access Code:  [email or web address] URL: [email or web address] Date: 12/15/2021 Prepared by: Precious Bard  Exercises - Long Sitting Quad Set  - 1 x daily - 7 x weekly - 2 sets - 10 reps - 5 hold - Active Straight Leg Raise with Quad Set  - 1 x daily - 7 x weekly - 2 sets - 10 reps - 5 hold - Mini Squat with Counter  Support  - 1 x daily - 7 x weekly - 2 sets - 10 reps - 5 hold - Side Lunge with Counter Support  - 1 x daily - 7 x weekly - 2 sets - 10 reps - 5 hold - Seated Ankle Alphabet  - 1 x daily - 7 x weekly - 2 sets - 10 reps - 5 hold   PT Short Term Goals       PT SHORT TERM GOAL #1   Title Patient will be independent in home exercise program to improve strength/mobility for better functional independence with ADLs.    Baseline 6/12; HEP given    Time 4    Status New    Target Date 01/02/22      PT SHORT TERM GOAL #2   Title Patient will report a worst pain of 5/10 on VAS in R knee to improve tolerance with ADLs and reduced symptoms with activities.    Baseline 6/12: 8/10    Time 4    Period Weeks    Status New    Target Date 01/02/22      PT SHORT TERM GOAL #3   Title Patient will increase 10 meter walk test to >1.56m/s with LRAD as to improve gait speed for better community ambulation and to reduce fall risk.    Baseline 6/12:0.77 with crutches    Time 4    Period Weeks    Status New    Target Date 01/02/22      PT SHORT TERM GOAL #4   Title --    Baseline --    Time --    Period --    Status --    Target Date --      PT SHORT TERM GOAL #5   Title --    Baseline --    Time --    Period --    Status --    Target Date --              PT Long Term Goals      PT LONG TERM GOAL #1   Title Patient will increase FOTO score to equal to or greater than  71   to demonstrate statistically significant improvement in mobility and quality of life.    Baseline 6/12: 30    Time 8    Period Weeks    Status New    Target Date 01/30/22      PT LONG TERM GOAL #2   Title Patient will report a worst pain of 3/10 on VAS in  R knee  to improve tolerance with ADLs and reduced symptoms with activities.    Baseline 6/12: 8/10 pain    Time 8    Period Weeks    Status New    Target Date 01/30/22      PT LONG TERM GOAL #3   Title Patient will increase lower extremity functional  scale to >60/80 to demonstrate improved functional mobility and increased tolerance with ADLs.    Baseline 6/12: 19/80    Time 8    Period Weeks  Status New    Target Date 01/30/22      PT LONG TERM GOAL #4   Title Patient will improve R knee ROM (neutral to 120 degrees) for return to PLOF and safe functional mobility.    Baseline 6/12: locked in extension    Time 8    Period Weeks    Status New    Target Date 01/30/22      PT LONG TERM GOAL #5   Title Patient will increase six minute walk test distance to >1000 for progression to community ambulator and improve gait ability    Baseline 6/12: unable to test    Time 8    Period Weeks    Status New    Target Date 01/30/22              Plan     Clinical Impression Statement Patient continues to have extensive guarding of surgical limb that is improved with dual task giving patient squeeze ball and fidget spinner. Patient is painful with end range flexion. His quad contraction is improving. Patient will benefit from skilled physical therapy to reduce pain, improve ROM, improve strength and mobility for return to PLOF    Personal Factors and Comorbidities Profession;Transportation    Examination-Activity Limitations Bathing;Bed Mobility;Bend;Caring for Others;Carry;Dressing;Hygiene/Grooming;Lift;Locomotion Level;Toileting;Stand;Stairs;Squat;Sit;Transfers    Examination-Participation Restrictions Cleaning;Community Activity;Driving;Meal Prep;Laundry;Occupation;Shop;Yard Work;Volunteer    Stability/Clinical Decision Making Stable/Uncomplicated    Rehab Potential Good    PT Frequency 2x / week    PT Duration 8 weeks    PT Treatment/Interventions ADLs/Self Care Home Management;Cryotherapy;Electrical Stimulation;Iontophoresis 4mg /ml Dexamethasone;Moist Heat;Gait training;DME Instruction;Stair training;Functional mobility training;Therapeutic activities;Therapeutic exercise;Patient/family education;Neuromuscular re-education;Balance  training;Orthotic Fit/Training;Manual techniques;Passive range of motion;Scar mobilization;Manual lymph drainage;Dry needling;Energy conservation;Splinting;Taping    PT Next Visit Plan quad control, e stim    PT Home Exercise Plan see above    Consulted and Agree with Plan of Care Patient            , PT, DPT  01/10/2022, 5:08 PM

## 2022-01-10 ENCOUNTER — Ambulatory Visit: Payer: 59

## 2022-01-10 DIAGNOSIS — M25661 Stiffness of right knee, not elsewhere classified: Secondary | ICD-10-CM

## 2022-01-10 DIAGNOSIS — M25561 Pain in right knee: Secondary | ICD-10-CM

## 2022-01-10 DIAGNOSIS — R262 Difficulty in walking, not elsewhere classified: Secondary | ICD-10-CM

## 2022-01-11 NOTE — Therapy (Signed)
OUTPATIENT PHYSICAL THERAPY TREATMENT NOTE/ Physical Therapy Progress Note   Dates of reporting period  12/05/21   to   01/12/22    Patient Name: Douglas Adams MRN: [id number] DOB:28-Dec-2000, 21 y.o., male Today's Date: 01/12/2022  PCP: Saddie Benders MD  REFERRING PROVIDER: Saddie Benders MD   PT End of Session - 01/12/22 0714     Visit Number 10    Number of Visits 16    Date for PT Re-Evaluation 01/30/22    Authorization Type [employer]    Authorization Time Period 12/05/21-01/30/22    PT Start Time 0715    PT Stop Time 0800    PT Time Calculation (min) 45 min    Equipment Utilized During Treatment Other (comment);Right knee immobilizer    Activity Tolerance Patient tolerated treatment well;Patient limited by pain    Behavior During Therapy [facility] for tasks assessed/performed                  Past Medical History:  Diagnosis Date   Allergies    Past Surgical History:  Procedure Laterality Date   ADENOIDECTOMY AND MYRINGOTOMY WITH TUBE PLACEMENT Bilateral 2007   ANTERIOR CRUCIATE LIGAMENT REPAIR Right 12/01/2021   Procedure: RECONSTRUCTION ANTERIOR CRUCIATE LIGAMENT (ACL);  Surgeon: Hiram Gash, MD;  Location: [place];  Service: Orthopedics;  Laterality: Right;   There are no problems to display for this patient.   REFERRING DIAG: ACL reconstruction and arthroscopy of R knee  THERAPY DIAG:  Acute pain of right knee  Stiffness of right knee, not elsewhere classified  Difficulty in walking, not elsewhere classified  Rationale for Evaluation and Treatment Rehabilitation  PERTINENT HISTORY: Patient is a 21 year old male s/p ACL reconstruction with meniscal repair 12/01/21. Patient was injured with a twisting injury while playing basketball where he felt a pop and was unable to bear weight. Protocol not provided however [name] has protocol from clinic: knee extension lock for one week, d/c brace by 4 weeks if quad control appropriate.  Progress PROM AAROM and AROM as tolerated for 0-6 weeks; with limitation of 0-90 for first 4 weeks. Still having nausea s/p surgery.   PRECAUTIONS: ACL protocol  SUBJECTIVE: Patient reports feeling a little sore. Will be going to physician tomorrow to assess need for further procedure. Will be absent next week due to being on a trip.   PAIN:  Are you having pain? Yes: NPRS scale: 4-5/10 Pain location: R knee Pain description: post surgical Aggravating factors: movement Relieving factors: elevating, ice, rest     TODAY'S TREATMENT:  Goals:  HEP: compliant  VAS: 6/10  10 MWT: 6 seconds no AD  FOTO 51 LEFS: 45 Knee ROM Flexion: seated 85 supine AROM: 75    extension: AROM -5 PROM -2 6 min walk test: 1340 ft without brace:    Manual:  Knee flexion/extension PROM 10x 10 second holds; performed in prone this session  Knee flexion PROM 10x 10 seconds supine  Patella mobilizations 15x 5 second mobilizations each direction   therEx:  Bicycle: forward 10x, backwards 10x; 2 sets ; (added to HEP)   Seated: Contract relax against PT with increasing knee flexion: very challenging with quad activation for first 2 sets, performed with hamstring activation technique with improved results  PATIENT EDUCATION: Education details: exercise technique, body mechanics, protocol Person educated: Patient Education method: Explanation, Demonstration, Tactile cues, and Verbal cues Education comprehension: verbalized understanding, returned demonstration, verbal cues required, and tactile cues required   HOME  EXERCISE PROGRAM: Access Code: [email or web address] URL: [email or web address] Date: 12/15/2021 Prepared by: [name]  Exercises - Long Sitting Quad Set  - 1 x daily - 7 x weekly - 2 sets - 10 reps - 5 hold - Active Straight Leg Raise with Quad Set  - 1 x daily - 7 x weekly - 2 sets - 10 reps - 5 hold - Mini Squat with Counter Support  - 1 x daily - 7 x weekly - 2 sets - 10 reps -  5 hold - Side Lunge with Counter Support  - 1 x daily - 7 x weekly - 2 sets - 10 reps - 5 hold - Seated Ankle Alphabet  - 1 x daily - 7 x weekly - 2 sets - 10 reps - 5 hold   PT Short Term Goals       PT SHORT TERM GOAL #1   Title Patient will be independent in home exercise program to improve strength/mobility for better functional independence with ADLs.    Baseline 6/12; HEP given 7/20 : HEP compliant    Time 4    Status Achieved   Target Date 01/02/22      PT SHORT TERM GOAL #2   Title Patient will report a worst pain of 5/10 on VAS in R knee to improve tolerance with ADLs and reduced symptoms with activities.    Baseline 6/12: 8/10 7/20: 6/10    Time 4    Period Weeks    Status Partially Met   Target Date 01/02/22      PT SHORT TERM GOAL #3   Title Patient will increase 10 meter walk test to >1.9ms with LRAD as to improve gait speed for better community ambulation and to reduce fall risk.    Baseline 6/12:0.77 with crutches 7/20: 1.6 m/s    Time 4    Period Weeks    Status Achieved   Target Date 01/02/22      PT SHORT TERM GOAL #4   Title --    Baseline --    Time --    Period --    Status --    Target Date --      PT SHORT TERM GOAL #5   Title --    Baseline --    Time --    Period --    Status --    Target Date --              PT Long Term Goals      PT LONG TERM GOAL #1   Title Patient will increase FOTO score to equal to or greater than  71   to demonstrate statistically significant improvement in mobility and quality of life.    Baseline 6/12: 30 7/20: 51    Time 8    Period Weeks    Status Partially Met   Target Date 01/30/22      PT LONG TERM GOAL #2   Title Patient will report a worst pain of 3/10 on VAS in  R knee  to improve tolerance with ADLs and reduced symptoms with activities.    Baseline 6/12: 8/10 pain 7/20: 6/10    Time 8    Period Weeks    Status Partially Met   Target Date 01/30/22      PT LONG TERM GOAL #3   Title  Patient will increase lower extremity functional scale to >60/80 to demonstrate improved functional mobility and increased tolerance with ADLs.  Baseline 6/12: 19/80 7/20: 45/80   Time 8    Period Weeks    Status Partially Met   Target Date 01/30/22      PT LONG TERM GOAL #4   Title Patient will improve R knee ROM (neutral to 120 degrees) for return to PLOF and safe functional mobility.    Baseline 6/12: locked in extension 7/20: seated 85 supine AROM: 75    extension: AROM -5 PROM -2   Time 8    Period Weeks    Status Partially Met   Target Date 01/30/22      PT LONG TERM GOAL #5   Title Patient will increase six minute walk test distance to >1000 for progression to community ambulator and improve gait ability    Baseline 6/12: unable to test 7/20: 1340 w/o brace   Time 8    Period Weeks    Status Partially Met   Target Date 01/30/22              Plan       Clinical Impression Statement Patient's goals assessed with patient demonstrating excellent progression towards goals.His ROM continues to be limited but does show improvement session to session. Quad activation is improving and an area of continued focus.Patient's condition has the potential to improve in response to therapy. Maximum improvement is yet to be obtained. The anticipated improvement is attainable and reasonable in a generally predictable time.  Patient will benefit from skilled physical therapy to reduce pain, improve ROM, improve strength and mobility for return to PLOF    Personal Factors and Comorbidities Profession;Transportation    Examination-Activity Limitations Bathing;Bed Mobility;Bend;Caring for Others;Carry;Dressing;Hygiene/Grooming;Lift;Locomotion Level;Toileting;Stand;Stairs;Squat;Sit;Transfers    Examination-Participation Restrictions Cleaning;Community Activity;Driving;Meal Prep;Laundry;Occupation;Shop;Yard Work;Volunteer    Stability/Clinical Decision Making Stable/Uncomplicated    Rehab  Potential Good    PT Frequency 2x / week    PT Duration 8 weeks    PT Treatment/Interventions ADLs/Self Care Home Management;Cryotherapy;Electrical Stimulation;Iontophoresis 44m/ml Dexamethasone;Moist Heat;Gait training;DME Instruction;Stair training;Functional mobility training;Therapeutic activities;Therapeutic exercise;Patient/family education;Neuromuscular re-education;Balance training;Orthotic Fit/Training;Manual techniques;Passive range of motion;Scar mobilization;Manual lymph drainage;Dry needling;Energy conservation;Splinting;Taping    PT Next Visit Plan quad control, e stim    PT Home Exercise Plan see above    Consulted and Agree with Plan of Care Patient            MJanna Arch PT, DPT  01/12/2022, 8:00 AM

## 2022-01-12 ENCOUNTER — Ambulatory Visit: Payer: 59

## 2022-01-12 DIAGNOSIS — M25561 Pain in right knee: Secondary | ICD-10-CM | POA: Diagnosis not present

## 2022-01-12 DIAGNOSIS — R262 Difficulty in walking, not elsewhere classified: Secondary | ICD-10-CM

## 2022-01-12 DIAGNOSIS — M25661 Stiffness of right knee, not elsewhere classified: Secondary | ICD-10-CM

## 2022-01-16 ENCOUNTER — Ambulatory Visit: Payer: 59

## 2022-01-19 ENCOUNTER — Ambulatory Visit: Payer: 59

## 2022-01-23 NOTE — Therapy (Signed)
OUTPATIENT PHYSICAL THERAPY TREATMENT NOTE/ Physical Therapy Progress Note   Dates of reporting period  12/05/21   to   01/12/22    Patient Name: Douglas Adams MRN: [id number] DOB:[birth year], 21 y.o., male Today's Date: 01/23/2022  PCP: Saddie Benders MD  REFERRING PROVIDER: Saddie Benders MD          Past Medical History:  Diagnosis Date   Allergies    Past Surgical History:  Procedure Laterality Date   ADENOIDECTOMY AND MYRINGOTOMY WITH TUBE PLACEMENT Bilateral 2007   ANTERIOR CRUCIATE LIGAMENT REPAIR Right 12/01/2021   Procedure: RECONSTRUCTION ANTERIOR CRUCIATE LIGAMENT (ACL);  Surgeon: Hiram Gash, MD;  Location: [place];  Service: Orthopedics;  Laterality: Right;   There are no problems to display for this patient.   REFERRING DIAG: ACL reconstruction and arthroscopy of R knee  THERAPY DIAG:  No diagnosis found.  Rationale for Evaluation and Treatment Rehabilitation  PERTINENT HISTORY: Patient is a 21 year old male s/p ACL reconstruction with meniscal repair 12/01/21. Patient was injured with a twisting injury while playing basketball where he felt a pop and was unable to bear weight. Protocol not provided however [name] has protocol from clinic: knee extension lock for one week, d/c brace by 4 weeks if quad control appropriate. Progress PROM AAROM and AROM as tolerated for 0-6 weeks; with limitation of 0-90 for first 4 weeks. Still having nausea s/p surgery.   PRECAUTIONS: ACL protocol  SUBJECTIVE: Patient returning from week absence due to trip.    PAIN:  Are you having pain? Yes: NPRS scale: 4-5/10 Pain location: R knee Pain description: post surgical Aggravating factors: movement Relieving factors: elevating, ice, rest     TODAY'S TREATMENT:     Manual:  Knee flexion/extension PROM 10x 10 second holds; performed in prone this session  Knee flexion PROM 10x 10 seconds supine  Patella mobilizations 15x 5 second mobilizations each  direction   therEx:  Bicycle: forward 10x, backwards 10x; 2 sets ; (added to HEP)   Seated: Contract relax against PT with increasing knee flexion: very challenging with quad activation for first 2 sets, performed with hamstring activation technique with improved results  PATIENT EDUCATION: Education details: exercise technique, body mechanics, protocol Person educated: Patient Education method: Explanation, Demonstration, Tactile cues, and Verbal cues Education comprehension: verbalized understanding, returned demonstration, verbal cues required, and tactile cues required   HOME EXERCISE PROGRAM: Access Code: [email or web address] URL: [email or web address] Date: 12/15/2021 Prepared by: [name]  Exercises - Long Sitting Quad Set  - 1 x daily - 7 x weekly - 2 sets - 10 reps - 5 hold - Active Straight Leg Raise with Quad Set  - 1 x daily - 7 x weekly - 2 sets - 10 reps - 5 hold - Mini Squat with Counter Support  - 1 x daily - 7 x weekly - 2 sets - 10 reps - 5 hold - Side Lunge with Counter Support  - 1 x daily - 7 x weekly - 2 sets - 10 reps - 5 hold - Seated Ankle Alphabet  - 1 x daily - 7 x weekly - 2 sets - 10 reps - 5 hold   PT Short Term Goals       PT SHORT TERM GOAL #1   Title Patient will be independent in home exercise program to improve strength/mobility for better functional independence with ADLs.    Baseline 6/12; HEP given 7/20 : HEP compliant    Time 4  Status Achieved   Target Date 01/02/22      PT SHORT TERM GOAL #2   Title Patient will report a worst pain of 5/10 on VAS in R knee to improve tolerance with ADLs and reduced symptoms with activities.    Baseline 6/12: 8/10 7/20: 6/10    Time 4    Period Weeks    Status Partially Met   Target Date 01/02/22      PT SHORT TERM GOAL #3   Title Patient will increase 10 meter walk test to >1.2ms with LRAD as to improve gait speed for better community ambulation and to reduce fall risk.    Baseline  6/12:0.77 with crutches 7/20: 1.6 m/s    Time 4    Period Weeks    Status Achieved   Target Date 01/02/22      PT SHORT TERM GOAL #4   Title --    Baseline --    Time --    Period --    Status --    Target Date --      PT SHORT TERM GOAL #5   Title --    Baseline --    Time --    Period --    Status --    Target Date --              PT Long Term Goals      PT LONG TERM GOAL #1   Title Patient will increase FOTO score to equal to or greater than  71   to demonstrate statistically significant improvement in mobility and quality of life.    Baseline 6/12: 30 7/20: 51    Time 8    Period Weeks    Status Partially Met   Target Date 01/30/22      PT LONG TERM GOAL #2   Title Patient will report a worst pain of 3/10 on VAS in  R knee  to improve tolerance with ADLs and reduced symptoms with activities.    Baseline 6/12: 8/10 pain 7/20: 6/10    Time 8    Period Weeks    Status Partially Met   Target Date 01/30/22      PT LONG TERM GOAL #3   Title Patient will increase lower extremity functional scale to >60/80 to demonstrate improved functional mobility and increased tolerance with ADLs.    Baseline 6/12: 19/80 7/20: 45/80   Time 8    Period Weeks    Status Partially Met   Target Date 01/30/22      PT LONG TERM GOAL #4   Title Patient will improve R knee ROM (neutral to 120 degrees) for return to PLOF and safe functional mobility.    Baseline 6/12: locked in extension 7/20: seated 85 supine AROM: 75    extension: AROM -5 PROM -2   Time 8    Period Weeks    Status Partially Met   Target Date 01/30/22      PT LONG TERM GOAL #5   Title Patient will increase six minute walk test distance to >1000 for progression to community ambulator and improve gait ability    Baseline 6/12: unable to test 7/20: 1340 w/o brace   Time 8    Period Weeks    Status Partially Met   Target Date 01/30/22              Plan       Clinical Impression Statement ****  Patient  will benefit  from skilled physical therapy to reduce pain, improve ROM, improve strength and mobility for return to PLOF    Personal Factors and Comorbidities Profession;Transportation    Examination-Activity Limitations Bathing;Bed Mobility;Bend;Caring for Others;Carry;Dressing;Hygiene/Grooming;Lift;Locomotion Level;Toileting;Stand;Stairs;Squat;Sit;Transfers    Examination-Participation Restrictions Cleaning;Community Activity;Driving;Meal Prep;Laundry;Occupation;Shop;Yard Work;Volunteer    Stability/Clinical Decision Making Stable/Uncomplicated    Rehab Potential Good    PT Frequency 2x / week    PT Duration 8 weeks    PT Treatment/Interventions ADLs/Self Care Home Management;Cryotherapy;Electrical Stimulation;Iontophoresis 2m/ml Dexamethasone;Moist Heat;Gait training;DME Instruction;Stair training;Functional mobility training;Therapeutic activities;Therapeutic exercise;Patient/family education;Neuromuscular re-education;Balance training;Orthotic Fit/Training;Manual techniques;Passive range of motion;Scar mobilization;Manual lymph drainage;Dry needling;Energy conservation;Splinting;Taping    PT Next Visit Plan quad control, e stim    PT Home Exercise Plan see above    Consulted and Agree with Plan of Care Patient            MJanna Arch PT, DPT  01/23/2022, 10:02 AM

## 2022-01-24 ENCOUNTER — Ambulatory Visit: Payer: 59 | Attending: Orthopaedic Surgery

## 2022-01-24 DIAGNOSIS — M25561 Pain in right knee: Secondary | ICD-10-CM | POA: Diagnosis present

## 2022-01-24 DIAGNOSIS — M25661 Stiffness of right knee, not elsewhere classified: Secondary | ICD-10-CM | POA: Insufficient documentation

## 2022-01-24 DIAGNOSIS — R262 Difficulty in walking, not elsewhere classified: Secondary | ICD-10-CM | POA: Insufficient documentation

## 2022-01-25 NOTE — Therapy (Signed)
OUTPATIENT PHYSICAL THERAPY TREATMENT NOTE  Patient Name: Douglas Adams MRN: [id number] DOB:[birth year], 21 y.o., male Today's Date: 01/26/2022  PCP: Saddie Benders MD  REFERRING PROVIDER: Saddie Benders MD   PT End of Session - 01/26/22 0713     Visit Number 12    Number of Visits 16    Date for PT Re-Evaluation 01/30/22    Authorization Type [employer]    Authorization Time Period 12/05/21-01/30/22    PT Start Time 0715    PT Stop Time 0759    PT Time Calculation (min) 44 min    Equipment Utilized During Treatment Other (comment);Right knee immobilizer    Activity Tolerance Patient tolerated treatment well;Patient limited by pain    Behavior During Therapy [facility] for tasks assessed/performed                    Past Medical History:  Diagnosis Date   Allergies    Past Surgical History:  Procedure Laterality Date   ADENOIDECTOMY AND MYRINGOTOMY WITH TUBE PLACEMENT Bilateral 2007   ANTERIOR CRUCIATE LIGAMENT REPAIR Right 12/01/2021   Procedure: RECONSTRUCTION ANTERIOR CRUCIATE LIGAMENT (ACL);  Surgeon: Hiram Gash, MD;  Location: [place];  Service: Orthopedics;  Laterality: Right;   There are no problems to display for this patient.   REFERRING DIAG: ACL reconstruction and arthroscopy of R knee  THERAPY DIAG:  Acute pain of right knee  Stiffness of right knee, not elsewhere classified  Difficulty in walking, not elsewhere classified  Rationale for Evaluation and Treatment Rehabilitation  PERTINENT HISTORY: Patient is a 21 year old male s/p ACL reconstruction with meniscal repair 12/01/21. Patient was injured with a twisting injury while playing basketball where he felt a pop and was unable to bear weight. Protocol not provided however [name] has protocol from clinic: knee extension lock for one week, d/c brace by 4 weeks if quad control appropriate. Progress PROM AAROM and AROM as tolerated for 0-6 weeks; with limitation of 0-90 for first 4  weeks. Still having nausea s/p surgery.   PRECAUTIONS: ACL protocol  SUBJECTIVE: Patient reports he was sore in the morning of last session but has been feeling looser since.   PAIN:  Are you having pain? Yes: NPRS scale: 4-5/10 Pain location: R knee Pain description: post surgical Aggravating factors: movement Relieving factors: elevating, ice, rest     TODAY'S TREATMENT:   Manual:  Knee flexion/extension PROM 10x 10 second holds; performed in prone this session  Knee flexion PROM 10x 10 seconds supine ; 10x prone  Patella mobilizations 15x 5 second mobilizations anterior, posterior Ice cup massage x6 minutes    therEx:   Bicycle: forward 10x, backwards 10x; 2 sets ;   Supine:   Quad set 10x with focus on quad activation rather than compensatory strategies ; hold 5 seconds SLR with quad set 10x Hamstring curl 10x ; x2 sets    Standing:  Squat 10x;  sets with occasional assistance for weight shift; cue for hold for 5 seconds and deepen squat position TRX lunge 10x each LE  TRX squat with UE support 10x  Stairs: -knee flexion 30 seconds x 2 trials -6' step ups 10x    Trigger Point Dry Needling (TDN), unbilled Education performed with patient regarding potential benefit of TDN. Reviewed precautions and risks with patient. Reviewed special precautions/risks over lung fields which include pneumothorax. Reviewed signs and symptoms of pneumothorax and advised pt to go to ER immediately if these symptoms develop advise  them of dry needling treatment. Extensive time spent with pt to ensure full understanding of TDN risks. Pt provided verbal consent to treatment. TDN performed to  with 0.25 x 40 single needle placements with local twitch response (LTR). Pistoning technique utilized. Improved pain-free motion following intervention. R quad x 3 minutes   PATIENT EDUCATION: Education details: exercise technique, body mechanics, protocol Person educated: Patient Education  method: Explanation, Demonstration, Tactile cues, and Verbal cues Education comprehension: verbalized understanding, returned demonstration, verbal cues required, and tactile cues required   HOME EXERCISE PROGRAM: Access Code: [email or web address] URL: [email or web address] Date: 12/15/2021 Prepared by: [name]  Exercises - Long Sitting Quad Set  - 1 x daily - 7 x weekly - 2 sets - 10 reps - 5 hold - Active Straight Leg Raise with Quad Set  - 1 x daily - 7 x weekly - 2 sets - 10 reps - 5 hold - Mini Squat with Counter Support  - 1 x daily - 7 x weekly - 2 sets - 10 reps - 5 hold - Side Lunge with Counter Support  - 1 x daily - 7 x weekly - 2 sets - 10 reps - 5 hold - Seated Ankle Alphabet  - 1 x daily - 7 x weekly - 2 sets - 10 reps - 5 hold   PT Short Term Goals       PT SHORT TERM GOAL #1   Title Patient will be independent in home exercise program to improve strength/mobility for better functional independence with ADLs.    Baseline 6/12; HEP given 7/20 : HEP compliant    Time 4    Status Achieved   Target Date 01/02/22      PT SHORT TERM GOAL #2   Title Patient will report a worst pain of 5/10 on VAS in R knee to improve tolerance with ADLs and reduced symptoms with activities.    Baseline 6/12: 8/10 7/20: 6/10    Time 4    Period Weeks    Status Partially Met   Target Date 01/02/22      PT SHORT TERM GOAL #3   Title Patient will increase 10 meter walk test to >1.35ms with LRAD as to improve gait speed for better community ambulation and to reduce fall risk.    Baseline 6/12:0.77 with crutches 7/20: 1.6 m/s    Time 4    Period Weeks    Status Achieved   Target Date 01/02/22      PT SHORT TERM GOAL #4   Title --    Baseline --    Time --    Period --    Status --    Target Date --      PT SHORT TERM GOAL #5   Title --    Baseline --    Time --    Period --    Status --    Target Date --              PT Long Term Goals      PT LONG TERM  GOAL #1   Title Patient will increase FOTO score to equal to or greater than  71   to demonstrate statistically significant improvement in mobility and quality of life.    Baseline 6/12: 30 7/20: 51    Time 8    Period Weeks    Status Partially Met   Target Date 01/30/22      PT LONG TERM GOAL #2  Title Patient will report a worst pain of 3/10 on VAS in  R knee  to improve tolerance with ADLs and reduced symptoms with activities.    Baseline 6/12: 8/10 pain 7/20: 6/10    Time 8    Period Weeks    Status Partially Met   Target Date 01/30/22      PT LONG TERM GOAL #3   Title Patient will increase lower extremity functional scale to >60/80 to demonstrate improved functional mobility and increased tolerance with ADLs.    Baseline 6/12: 19/80 7/20: 45/80   Time 8    Period Weeks    Status Partially Met   Target Date 01/30/22      PT LONG TERM GOAL #4   Title Patient will improve R knee ROM (neutral to 120 degrees) for return to PLOF and safe functional mobility.    Baseline 6/12: locked in extension 7/20: seated 85 supine AROM: 75    extension: AROM -5 PROM -2   Time 8    Period Weeks    Status Partially Met   Target Date 01/30/22      PT LONG TERM GOAL #5   Title Patient will increase six minute walk test distance to >1000 for progression to community ambulator and improve gait ability    Baseline 6/12: unable to test 7/20: 1340 w/o brace   Time 8    Period Weeks    Status Partially Met   Target Date 01/30/22              Plan       Clinical Impression Statement Patient's flexion continues to be an area of progress. His quad contraction requires cues for reduction of gluteal compensation. Patient does have improved close chained ROM this session.  Patient will benefit from skilled physical therapy to reduce pain, improve ROM, improve strength and mobility for return to PLOF    Personal Factors and Comorbidities Profession;Transportation    Examination-Activity  Limitations Bathing;Bed Mobility;Bend;Caring for Others;Carry;Dressing;Hygiene/Grooming;Lift;Locomotion Level;Toileting;Stand;Stairs;Squat;Sit;Transfers    Examination-Participation Restrictions Cleaning;Community Activity;Driving;Meal Prep;Laundry;Occupation;Shop;Yard Work;Volunteer    Stability/Clinical Decision Making Stable/Uncomplicated    Rehab Potential Good    PT Frequency 2x / week    PT Duration 8 weeks    PT Treatment/Interventions ADLs/Self Care Home Management;Cryotherapy;Electrical Stimulation;Iontophoresis 2m/ml Dexamethasone;Moist Heat;Gait training;DME Instruction;Stair training;Functional mobility training;Therapeutic activities;Therapeutic exercise;Patient/family education;Neuromuscular re-education;Balance training;Orthotic Fit/Training;Manual techniques;Passive range of motion;Scar mobilization;Manual lymph drainage;Dry needling;Energy conservation;Splinting;Taping    PT Next Visit Plan quad control, e stim    PT Home Exercise Plan see above    Consulted and Agree with Plan of Care Patient            MJanna Arch PT, DPT  01/26/2022, 8:56 AM

## 2022-01-26 ENCOUNTER — Ambulatory Visit: Payer: 59

## 2022-01-26 DIAGNOSIS — M25561 Pain in right knee: Secondary | ICD-10-CM | POA: Diagnosis not present

## 2022-01-26 DIAGNOSIS — M25661 Stiffness of right knee, not elsewhere classified: Secondary | ICD-10-CM

## 2022-01-26 DIAGNOSIS — R262 Difficulty in walking, not elsewhere classified: Secondary | ICD-10-CM

## 2022-01-30 NOTE — Therapy (Signed)
OUTPATIENT PHYSICAL THERAPY TREATMENT NOTE/RECERT  Patient Name: Douglas Adams MRN: [id number] DOB:2000/09/24, 21 y.o., male Today's Date: 01/31/2022  PCP: Saddie Benders MD  REFERRING PROVIDER: Saddie Benders MD   PT End of Session - 01/31/22 0915     Visit Number 13    Number of Visits 29    Date for PT Re-Evaluation 03/28/22    Authorization Type [employer]    Authorization Time Period 12/05/21-01/30/22    PT Start Time 0845    PT Stop Time 0929    PT Time Calculation (min) 44 min    Equipment Utilized During Treatment Other (comment);Right knee immobilizer    Activity Tolerance Patient tolerated treatment well;Patient limited by pain    Behavior During Therapy [facility] for tasks assessed/performed                     Past Medical History:  Diagnosis Date   Allergies    Past Surgical History:  Procedure Laterality Date   ADENOIDECTOMY AND MYRINGOTOMY WITH TUBE PLACEMENT Bilateral 2007   ANTERIOR CRUCIATE LIGAMENT REPAIR Right 12/01/2021   Procedure: RECONSTRUCTION ANTERIOR CRUCIATE LIGAMENT (ACL);  Surgeon: Hiram Gash, MD;  Location: [place];  Service: Orthopedics;  Laterality: Right;   There are no problems to display for this patient.   REFERRING DIAG: ACL reconstruction and arthroscopy of R knee  THERAPY DIAG:  Acute pain of right knee  Stiffness of right knee, not elsewhere classified  Difficulty in walking, not elsewhere classified  Rationale for Evaluation and Treatment Rehabilitation  PERTINENT HISTORY: Patient is a 21 year old male s/p ACL reconstruction with meniscal repair 12/01/21. Patient was injured with a twisting injury while playing basketball where he felt a pop and was unable to bear weight. Protocol not provided however [name] has protocol from clinic: knee extension lock for one week, d/c brace by 4 weeks if quad control appropriate. Progress PROM AAROM and AROM as tolerated for 0-6 weeks; with limitation of 0-90  for first 4 weeks. Still having nausea s/p surgery.   PRECAUTIONS: ACL protocol  SUBJECTIVE: Patient reports he [place] [employer] over [place] for bachelor party. Pain has been doing well, some "crunching"   PAIN:  Are you having pain? Yes: NPRS scale: 3/10 Pain location: R knee Pain description: post surgical Aggravating factors: movement Relieving factors: elevating, ice, rest     TODAY'S TREATMENT:  Goals:  VAS: 5/10  FOTO: 65 LEFS: 49 ROM : supine AROM 75 PROM 80;   seated: AROM 73 PROM 86  6 minute walk test: 1690 ft  Manual:  Knee flexion/extension PROM 10x 10 second holds; performed in prone this session  Knee flexion PROM 10x 10 seconds supine ; 10x prone  Patella mobilizations 15x 5 second mobilizations anterior, posterior    therEx:   Bicycle: forward 10x, backwards 10x; 2 sets ;   Supine:   Quad set 10x with focus on quad activation rather than compensatory strategies ; hold 5 seconds  Heel slide 10x    Standing:  Squat 10x;  sets with occasional assistance for weight shift; cue for hold for 5 seconds and deepen squat position TRX lunge 10x each LE  TRX squat with UE support 10x    PATIENT EDUCATION: Education details: exercise technique, body mechanics, protocol Person educated: Patient Education method: Explanation, Demonstration, Tactile cues, and Verbal cues Education comprehension: verbalized understanding, returned demonstration, verbal cues required, and tactile cues required   HOME EXERCISE PROGRAM: Access Code:  [email or web address] URL: [email or web address] Date: 12/15/2021 Prepared by: [name]  Exercises - Long Sitting Quad Set  - 1 x daily - 7 x weekly - 2 sets - 10 reps - 5 hold - Active Straight Leg Raise with Quad Set  - 1 x daily - 7 x weekly - 2 sets - 10 reps - 5 hold - Mini Squat with Counter Support  - 1 x daily - 7 x weekly - 2 sets - 10 reps - 5 hold - Side Lunge with Counter Support  - 1 x daily - 7 x weekly - 2  sets - 10 reps - 5 hold - Seated Ankle Alphabet  - 1 x daily - 7 x weekly - 2 sets - 10 reps - 5 hold   PT Short Term Goals       PT SHORT TERM GOAL #1   Title Patient will be independent in home exercise program to improve strength/mobility for better functional independence with ADLs.    Baseline 6/12; HEP given 7/20 : HEP compliant    Time 4    Status Achieved   Target Date 01/02/22      PT SHORT TERM GOAL #2   Title Patient will report a worst pain of 5/10 on VAS in R knee to improve tolerance with ADLs and reduced symptoms with activities.    Baseline 6/12: 8/10 7/20: 6/10 8/8: 5/10    Time 4    Period Weeks    Status MET   Target Date 01/02/22      PT SHORT TERM GOAL #3   Title Patient will increase 10 meter walk test to >1.21ms with LRAD as to improve gait speed for better community ambulation and to reduce fall risk.    Baseline 6/12:0.77 with crutches 7/20: 1.6 m/s    Time 4    Period Weeks    Status Achieved   Target Date 01/02/22      PT SHORT TERM GOAL #4   Title --    Baseline --    Time --    Period --    Status --    Target Date --      PT SHORT TERM GOAL #5   Title --    Baseline --    Time --    Period --    Status --    Target Date --              PT Long Term Goals      PT LONG TERM GOAL #1   Title Patient will increase FOTO score to equal to or greater than  71   to demonstrate statistically significant improvement in mobility and quality of life.    Baseline 6/12: 30 7/20: 51 8/8: 65%    Time 8    Period Weeks    Status Partially Met   Target Date 03/28/2022       PT LONG TERM GOAL #2   Title Patient will report a worst pain of 3/10 on VAS in  R knee  to improve tolerance with ADLs and reduced symptoms with activities.    Baseline 6/12: 8/10 pain 7/20: 6/10 8/8: 5/10    Time 8    Period Weeks    Status Partially Met   Target Date 03/28/2022       PT LONG TERM GOAL #3   Title Patient will increase lower extremity functional  scale to >60/80 to demonstrate improved functional mobility and increased tolerance with ADLs.  Baseline 6/12: 19/80 7/20: 45/80 8/8: 49/80    Time 8    Period Weeks    Status Partially Met   Target Date 03/28/2022       PT LONG TERM GOAL #4   Title Patient will improve R knee ROM (neutral to 120 degrees) for return to PLOF and safe functional mobility.    Baseline 6/12: locked in extension 7/20: seated 85 supine AROM: 75    extension: AROM -5 PROM -2 8/8:  supine AROM 75 PROM 80;   seated: AROM 73 PROM 86    Time 8    Period Weeks    Status Partially Met   Target Date 03/28/2022       PT LONG TERM GOAL #5   Title Patient will increase six minute walk test distance to >1000 for progression to community ambulator and improve gait ability    Baseline 6/12: unable to test 7/20: 1340 w/o brace 8/8: 1690 ft    Time 8    Period Weeks    Status MET   Target Date 03/28/2022     PT LONG TERM GOAL #6  Title Patient will return to sport and running 3 miles without pain increase for return to PLOF   Baseline 8/8: unable to play or run  Time 8   Period Weeks   Status NEW  Target Date 03/28/2022              Plan       Clinical Impression Statement Patient is making significant progress towards functional goals however his ROM continues to be limited. He has met his 6 minute walk test goal at this time and his pain has reduced meeting short term goal but not yet long term goal. New goal of return to sport added to [place]. Patient aware that need for increased ROM or will need correction procedure. Patient will benefit from skilled physical therapy to reduce pain, improve ROM, improve strength and mobility for return to PLOF    Personal Factors and Comorbidities Profession;Transportation    Examination-Activity Limitations Bathing;Bed Mobility;Bend;Caring for Others;Carry;Dressing;Hygiene/Grooming;Lift;Locomotion Level;Toileting;Stand;Stairs;Squat;Sit;Transfers     Examination-Participation Restrictions Cleaning;Community Activity;Driving;Meal Prep;Laundry;Occupation;Shop;Yard Work;Volunteer    Stability/Clinical Decision Making Stable/Uncomplicated    Rehab Potential Good    PT Frequency 2x / week    PT Duration 8 weeks    PT Treatment/Interventions ADLs/Self Care Home Management;Cryotherapy;Electrical Stimulation;Iontophoresis 58m/ml Dexamethasone;Moist Heat;Gait training;DME Instruction;Stair training;Functional mobility training;Therapeutic activities;Therapeutic exercise;Patient/family education;Neuromuscular re-education;Balance training;Orthotic Fit/Training;Manual techniques;Passive range of motion;Scar mobilization;Manual lymph drainage;Dry needling;Energy conservation;Splinting;Taping    PT Next Visit Plan quad control, e stim    PT Home Exercise Plan see above    Consulted and Agree with Plan of Care Patient            MJanna Arch PT, DPT  01/31/2022, 10:43 AM

## 2022-01-31 ENCOUNTER — Ambulatory Visit: Payer: 59

## 2022-01-31 DIAGNOSIS — M25561 Pain in right knee: Secondary | ICD-10-CM

## 2022-01-31 DIAGNOSIS — M25661 Stiffness of right knee, not elsewhere classified: Secondary | ICD-10-CM

## 2022-01-31 DIAGNOSIS — R262 Difficulty in walking, not elsewhere classified: Secondary | ICD-10-CM

## 2022-02-02 ENCOUNTER — Ambulatory Visit: Payer: 59

## 2022-02-02 DIAGNOSIS — M25661 Stiffness of right knee, not elsewhere classified: Secondary | ICD-10-CM

## 2022-02-02 DIAGNOSIS — M25561 Pain in right knee: Secondary | ICD-10-CM | POA: Diagnosis not present

## 2022-02-02 DIAGNOSIS — R262 Difficulty in walking, not elsewhere classified: Secondary | ICD-10-CM

## 2022-02-02 NOTE — Therapy (Signed)
OUTPATIENT PHYSICAL THERAPY TREATMENT NOTE/RECERT  Patient Name: Douglas Adams MRN: [id number] DOB:2001/03/13, 21 y.o., male Today's Date: 02/02/2022  PCP: Saddie Benders MD  REFERRING PROVIDER: Saddie Benders MD   PT End of Session - 02/02/22 0721     Visit Number 14    Number of Visits 29    Date for PT Re-Evaluation 03/28/22    Authorization Type [employer]    Authorization Time Period 01/31/22-03/28/22    Progress Note Due on Visit 20    PT Start Time 0717    PT Stop Time 0757    PT Time Calculation (min) 40 min    Activity Tolerance Patient tolerated treatment well    Behavior During Therapy [facility] for tasks assessed/performed              Past Medical History:  Diagnosis Date   Allergies    Past Surgical History:  Procedure Laterality Date   ADENOIDECTOMY AND MYRINGOTOMY WITH TUBE PLACEMENT Bilateral 2007   ANTERIOR CRUCIATE LIGAMENT REPAIR Right 12/01/2021   Procedure: RECONSTRUCTION ANTERIOR CRUCIATE LIGAMENT (ACL);  Surgeon: Hiram Gash, MD;  Location: [place];  Service: Orthopedics;  Laterality: Right;   There are no problems to display for this patient.   REFERRING DIAG: ACL reconstruction and arthroscopy of R knee  THERAPY DIAG:  Acute pain of right knee  Stiffness of right knee, not elsewhere classified  Difficulty in walking, not elsewhere classified  Rationale for Evaluation and Treatment Rehabilitation  PERTINENT HISTORY: Patient is a 21 year old male s/p ACL reconstruction with meniscal repair 12/01/21. Patient was injured with a twisting injury while playing basketball where he felt a pop and was unable to bear weight. Protocol not provided however [name] has protocol from clinic: knee extension lock for one week, d/c brace by 4 weeks if quad control appropriate. Progress PROM AAROM and AROM as tolerated for 0-6 weeks; with limitation of 0-90 for first 4 weeks. Still having nausea s/p surgery.   PRECAUTIONS: ACL  protocol  SUBJECTIVE: Knee stiff this morning, but not painful. Hamstrings remain painful, but improving. Still working on stretches at home. Pt goes back to see surgeon on 02/10/22, per Dr. Griffin Basil, manipulation may be indicated.   PAIN:  Are you having pain? Yes: NPRS scale: 3/10 Pain location: Rt Patella  Pain description: post surgical Aggravating factors: movement Relieving factors: elevating, ice, rest  INTERVENTION Calf strength screening: single leg heel raises x20 to failure on Left, x10 on Right, education on rectifying this.  AA/ROM Bilat knees on recumbent bike 4 minutes Seated knee flexion stretch using wall/chair position 5x45sec  *heat applied to central quads  Prone Rt knee flexion c strap 3x45sec Prone hamstrings curl 1x15 @ 5lb AW Prone quad set 1x15x3secH  Prone hamstrings curl 1x15 @ 5lb AW Prone quad set 1x15x3secH  Prone hamstrings curl 1x15 @ 5lb AW  *post intervention knee flexion at 66 degrees   Supine Rt Knee extension activation 1x20   Elevated surface STS squats x15  PATIENT EDUCATION: Education details: exercise technique, body mechanics, protocol Person educated: Patient Education method: Explanation, Demonstration, Tactile cues, and Verbal cues Education comprehension: verbalized understanding, returned demonstration, verbal cues required, and tactile cues required   HOME EXERCISE PROGRAM: Access Code: [email or web address] URL: [email or web address] Date: 12/15/2021 Prepared by: [name]  Exercises - Long Sitting Quad Set  - 1 x daily - 7 x weekly - 2 sets - 10 reps - 5 hold - Active Straight Leg Raise with Quad  Set  - 1 x daily - 7 x weekly - 2 sets - 10 reps - 5 hold - Mini Squat with Counter Support  - 1 x daily - 7 x weekly - 2 sets - 10 reps - 5 hold - Side Lunge with Counter Support  - 1 x daily - 7 x weekly - 2 sets - 10 reps - 5 hold - Seated Ankle Alphabet  - 1 x daily - 7 x weekly - 2 sets - 10 reps - 5 hold   PT Short  Term Goals       PT SHORT TERM GOAL #1   Title Patient will be independent in home exercise program to improve strength/mobility for better functional independence with ADLs.    Baseline 6/12; HEP given 7/20 : HEP compliant    Time 4    Status Achieved   Target Date 01/02/22      PT SHORT TERM GOAL #2   Title Patient will report a worst pain of 5/10 on VAS in R knee to improve tolerance with ADLs and reduced symptoms with activities.    Baseline 6/12: 8/10 7/20: 6/10 8/8: 5/10    Time 4    Period Weeks    Status MET   Target Date 01/02/22      PT SHORT TERM GOAL #3   Title Patient will increase 10 meter walk test to >1.34ms with LRAD as to improve gait speed for better community ambulation and to reduce fall risk.    Baseline 6/12:0.77 with crutches 7/20: 1.6 m/s    Time 4    Period Weeks    Status Achieved   Target Date 01/02/22      PT SHORT TERM GOAL #4   Title --    Baseline --    Time --    Period --    Status --    Target Date --      PT SHORT TERM GOAL #5   Title --    Baseline --    Time --    Period --    Status --    Target Date --              PT Long Term Goals      PT LONG TERM GOAL #1   Title Patient will increase FOTO score to equal to or greater than  71   to demonstrate statistically significant improvement in mobility and quality of life.    Baseline 6/12: 30 7/20: 51 8/8: 65%    Time 8    Period Weeks    Status Partially Met   Target Date 03/28/2022       PT LONG TERM GOAL #2   Title Patient will report a worst pain of 3/10 on VAS in  R knee  to improve tolerance with ADLs and reduced symptoms with activities.    Baseline 6/12: 8/10 pain 7/20: 6/10 8/8: 5/10    Time 8    Period Weeks    Status Partially Met   Target Date 03/28/2022       PT LONG TERM GOAL #3   Title Patient will increase lower extremity functional scale to >60/80 to demonstrate improved functional mobility and increased tolerance with ADLs.    Baseline 6/12:  19/80 7/20: 45/80 8/8: 49/80    Time 8    Period Weeks    Status Partially Met   Target Date 03/28/2022       PT LONG TERM GOAL #  4   Title Patient will improve R knee ROM (neutral to 120 degrees) for return to PLOF and safe functional mobility.    Baseline 6/12: locked in extension 7/20: seated 85 supine AROM: 75    extension: AROM -5 PROM -2 8/8:  supine AROM 75 PROM 80;   seated: AROM 73 PROM 86    Time 8    Period Weeks    Status Partially Met   Target Date 03/28/2022       PT LONG TERM GOAL #5   Title Patient will increase six minute walk test distance to >1000 for progression to community ambulator and improve gait ability    Baseline 6/12: unable to test 7/20: 1340 w/o brace 8/8: 1690 ft    Time 8    Period Weeks    Status MET   Target Date 03/28/2022     PT LONG TERM GOAL #6  Title Patient will return to sport and running 3 miles without pain increase for return to PLOF   Baseline 8/8: unable to play or run  Time 8   Period Weeks   Status NEW  Target Date 03/28/2022              Plan       Clinical Impression Statement Quads activation improving. Calf deficits quantified and addressed today. Hamstrings tolerant of loading. Flexion ROM limited to 66 degrees despite >5 minutes of stretching in session. Extension restrictions less concerning, more typical of postop effusion. Patient will benefit from skilled physical therapy to reduce pain, improve ROM, improve strength and mobility for return to PLOF.    Personal Factors and Comorbidities Profession;Transportation    Examination-Activity Limitations Bathing;Bed Mobility;Bend;Caring for Others;Carry;Dressing;Hygiene/Grooming;Lift;Locomotion Level;Toileting;Stand;Stairs;Squat;Sit;Transfers    Examination-Participation Restrictions Cleaning;Community Activity;Driving;Meal Prep;Laundry;Occupation;Shop;Yard Work;Volunteer    Stability/Clinical Decision Making Stable/Uncomplicated    Rehab Potential Good    PT Frequency  2x / week    PT Duration 8 weeks    PT Treatment/Interventions ADLs/Self Care Home Management;Cryotherapy;Electrical Stimulation;Iontophoresis 78m/ml Dexamethasone;Moist Heat;Gait training;DME Instruction;Stair training;Functional mobility training;Therapeutic activities;Therapeutic exercise;Patient/family education;Neuromuscular re-education;Balance training;Orthotic Fit/Training;Manual techniques;Passive range of motion;Scar mobilization;Manual lymph drainage;Dry needling;Energy conservation;Splinting;Taping    PT Next Visit Plan    PT Home Exercise Plan see above    Consulted and Agree with Plan of Care Patient            8:08 AM, 02/02/22 AEtta Grandchild PT, DPT Physical Therapist - [facility] [phone]    02/02/2022, 7:24 AM

## 2022-02-06 NOTE — Therapy (Signed)
OUTPATIENT PHYSICAL THERAPY TREATMENT NOTE  Patient Name: Douglas Adams MRN: [id number] DOB:02/24/2001, 21 y.o., male Today's Date: 02/07/2022  PCP: Saddie Benders MD  REFERRING PROVIDER: Saddie Benders MD   PT End of Session - 02/07/22 0758     Visit Number 15    Number of Visits 29    Date for PT Re-Evaluation 03/28/22    Authorization Type [employer]    Authorization Time Period 01/31/22-03/28/22    Progress Note Due on Visit 20    PT Start Time 0715    PT Stop Time 0759    PT Time Calculation (min) 44 min    Activity Tolerance Patient tolerated treatment well    Behavior During Therapy [facility] for tasks assessed/performed               Past Medical History:  Diagnosis Date   Allergies    Past Surgical History:  Procedure Laterality Date   ADENOIDECTOMY AND MYRINGOTOMY WITH TUBE PLACEMENT Bilateral 2007   ANTERIOR CRUCIATE LIGAMENT REPAIR Right 12/01/2021   Procedure: RECONSTRUCTION ANTERIOR CRUCIATE LIGAMENT (ACL);  Surgeon: Hiram Gash, MD;  Location: [place];  Service: Orthopedics;  Laterality: Right;   There are no problems to display for this patient.   REFERRING DIAG: ACL reconstruction and arthroscopy of R knee  THERAPY DIAG:  Acute pain of right knee  Stiffness of right knee, not elsewhere classified  Difficulty in walking, not elsewhere classified  Rationale for Evaluation and Treatment Rehabilitation  PERTINENT HISTORY: Patient is a 21 year old male s/p ACL reconstruction with meniscal repair 12/01/21. Patient was injured with a twisting injury while playing basketball where he felt a pop and was unable to bear weight. Protocol not provided however [name] has protocol from clinic: knee extension lock for one week, d/c brace by 4 weeks if quad control appropriate. Progress PROM AAROM and AROM as tolerated for 0-6 weeks; with limitation of 0-90 for first 4 weeks. Still having nausea s/p surgery.   PRECAUTIONS: ACL  protocol  SUBJECTIVE: Patient reports compliance with HEP. Is feeling stiff today.   PAIN:  Are you having pain? Yes: NPRS scale: 3/10 Pain location: Rt Patella  Pain description: post surgical Aggravating factors: movement Relieving factors: elevating, ice, rest  INTERVENTION  Manual:  Knee flexion/extension PROM 10x 10 second holds; performed in prone this session  Knee flexion PROM 10x 10 seconds supine ; 10x prone  Patella mobilizations 20x 5 second mobilizations anterior, posterior Ice cup massage x5 minutes     therEx:   Bicycle: forward 10x, backwards 10x; 2 sets ;   Supine:   Quad set 10x with focus on quad activation rather than compensatory strategies ; hold 5 seconds   Heel slide 10x   Prone: Hamstring curl 10x 5 second holds    Standing:   TRX lateral squat 10x each side TRX squat with UE support 10x 5 second holds TRX single limb squat RLE 8x    Stool: forward/backwards propelling 20 ft x 3 trials each direction for 6 total.    Trigger Point Dry Needling (TDN), unbilled Education performed with patient regarding potential benefit of TDN. Reviewed precautions and risks with patient. Reviewed special precautions/risks over lung fields which include pneumothorax. Reviewed signs and symptoms of pneumothorax and advised pt to go to ER immediately if these symptoms develop advise them of dry needling treatment. Extensive time spent with pt to ensure full understanding of TDN risks. Pt provided verbal consent to treatment. TDN performed  to  with 0.3 x 60 single needle placements with local twitch response (LTR). Pistoning technique utilized. Improved pain-free motion following intervention. R quad x 2 minutes   PATIENT EDUCATION: Education details: exercise technique, body mechanics, protocol Person educated: Patient Education method: Explanation, Demonstration, Tactile cues, and Verbal cues Education comprehension: verbalized understanding, returned  demonstration, verbal cues required, and tactile cues required   HOME EXERCISE PROGRAM: Access Code: [email or web address] URL: [email or web address] Date: 12/15/2021 Prepared by: [name]  Exercises - Long Sitting Quad Set  - 1 x daily - 7 x weekly - 2 sets - 10 reps - 5 hold - Active Straight Leg Raise with Quad Set  - 1 x daily - 7 x weekly - 2 sets - 10 reps - 5 hold - Mini Squat with Counter Support  - 1 x daily - 7 x weekly - 2 sets - 10 reps - 5 hold - Side Lunge with Counter Support  - 1 x daily - 7 x weekly - 2 sets - 10 reps - 5 hold - Seated Ankle Alphabet  - 1 x daily - 7 x weekly - 2 sets - 10 reps - 5 hold   PT Short Term Goals       PT SHORT TERM GOAL #1   Title Patient will be independent in home exercise program to improve strength/mobility for better functional independence with ADLs.    Baseline 6/12; HEP given 7/20 : HEP compliant    Time 4    Status Achieved   Target Date 01/02/22      PT SHORT TERM GOAL #2   Title Patient will report a worst pain of 5/10 on VAS in R knee to improve tolerance with ADLs and reduced symptoms with activities.    Baseline 6/12: 8/10 7/20: 6/10 8/8: 5/10    Time 4    Period Weeks    Status MET   Target Date 01/02/22      PT SHORT TERM GOAL #3   Title Patient will increase 10 meter walk test to >1.8ms with LRAD as to improve gait speed for better community ambulation and to reduce fall risk.    Baseline 6/12:0.77 with crutches 7/20: 1.6 m/s    Time 4    Period Weeks    Status Achieved   Target Date 01/02/22      PT SHORT TERM GOAL #4   Title --    Baseline --    Time --    Period --    Status --    Target Date --      PT SHORT TERM GOAL #5   Title --    Baseline --    Time --    Period --    Status --    Target Date --              PT Long Term Goals      PT LONG TERM GOAL #1   Title Patient will increase FOTO score to equal to or greater than  71   to demonstrate statistically significant  improvement in mobility and quality of life.    Baseline 6/12: 30 7/20: 51 8/8: 65%    Time 8    Period Weeks    Status Partially Met   Target Date 03/28/2022       PT LONG TERM GOAL #2   Title Patient will report a worst pain of 3/10 on VAS in  R knee  to improve tolerance with ADLs  and reduced symptoms with activities.    Baseline 6/12: 8/10 pain 7/20: 6/10 8/8: 5/10    Time 8    Period Weeks    Status Partially Met   Target Date 03/28/2022       PT LONG TERM GOAL #3   Title Patient will increase lower extremity functional scale to >60/80 to demonstrate improved functional mobility and increased tolerance with ADLs.    Baseline 6/12: 19/80 7/20: 45/80 8/8: 49/80    Time 8    Period Weeks    Status Partially Met   Target Date 03/28/2022       PT LONG TERM GOAL #4   Title Patient will improve R knee ROM (neutral to 120 degrees) for return to PLOF and safe functional mobility.    Baseline 6/12: locked in extension 7/20: seated 85 supine AROM: 75    extension: AROM -5 PROM -2 8/8:  supine AROM 75 PROM 80;   seated: AROM 73 PROM 86    Time 8    Period Weeks    Status Partially Met   Target Date 03/28/2022       PT LONG TERM GOAL #5   Title Patient will increase six minute walk test distance to >1000 for progression to community ambulator and improve gait ability    Baseline 6/12: unable to test 7/20: 1340 w/o brace 8/8: 1690 ft    Time 8    Period Weeks    Status MET   Target Date 03/28/2022     PT LONG TERM GOAL #6  Title Patient will return to sport and running 3 miles without pain increase for return to PLOF   Baseline 8/8: unable to play or run  Time 8   Period Weeks   Status NEW  Target Date 03/28/2022              Plan       Clinical Impression Statement Patient tolerates progressive strengthening and flexion interventions well. He continues to have flexion limitations and requires active cueing for reduction of guarding but does demonstrate carryover  between interventions.  Patient will benefit from skilled physical therapy to reduce pain, improve ROM, improve strength and mobility for return to PLOF.    Personal Factors and Comorbidities Profession;Transportation    Examination-Activity Limitations Bathing;Bed Mobility;Bend;Caring for Others;Carry;Dressing;Hygiene/Grooming;Lift;Locomotion Level;Toileting;Stand;Stairs;Squat;Sit;Transfers    Examination-Participation Restrictions Cleaning;Community Activity;Driving;Meal Prep;Laundry;Occupation;Shop;Yard Work;Volunteer    Stability/Clinical Decision Making Stable/Uncomplicated    Rehab Potential Good    PT Frequency 2x / week    PT Duration 8 weeks    PT Treatment/Interventions ADLs/Self Care Home Management;Cryotherapy;Electrical Stimulation;Iontophoresis 31m/ml Dexamethasone;Moist Heat;Gait training;DME Instruction;Stair training;Functional mobility training;Therapeutic activities;Therapeutic exercise;Patient/family education;Neuromuscular re-education;Balance training;Orthotic Fit/Training;Manual techniques;Passive range of motion;Scar mobilization;Manual lymph drainage;Dry needling;Energy conservation;Splinting;Taping    PT Next Visit Plan    PT Home Exercise Plan see above    Consulted and Agree with Plan of Care Patient            MJanna ArchPT    02/07/2022, 8:29 AM

## 2022-02-07 ENCOUNTER — Ambulatory Visit: Payer: 59

## 2022-02-07 DIAGNOSIS — R262 Difficulty in walking, not elsewhere classified: Secondary | ICD-10-CM

## 2022-02-07 DIAGNOSIS — M25561 Pain in right knee: Secondary | ICD-10-CM

## 2022-02-07 DIAGNOSIS — M25661 Stiffness of right knee, not elsewhere classified: Secondary | ICD-10-CM

## 2022-02-08 NOTE — Therapy (Signed)
OUTPATIENT PHYSICAL THERAPY TREATMENT NOTE  Patient Name: Douglas Adams MRN: [id number] DOB:04-17-2001, 21 y.o., male Today's Date: 02/09/2022  PCP: Saddie Benders MD  REFERRING PROVIDER: Saddie Benders MD   PT End of Session - 02/09/22 0714     Visit Number 16    Number of Visits 29    Date for PT Re-Evaluation 03/28/22    Authorization Type [employer]    Authorization Time Period 01/31/22-03/28/22    Progress Note Due on Visit 20    PT Start Time 0715    PT Stop Time 0759    PT Time Calculation (min) 44 min    Activity Tolerance Patient tolerated treatment well    Behavior During Therapy [facility] for tasks assessed/performed                Past Medical History:  Diagnosis Date   Allergies    Past Surgical History:  Procedure Laterality Date   ADENOIDECTOMY AND MYRINGOTOMY WITH TUBE PLACEMENT Bilateral 2007   ANTERIOR CRUCIATE LIGAMENT REPAIR Right 12/01/2021   Procedure: RECONSTRUCTION ANTERIOR CRUCIATE LIGAMENT (ACL);  Surgeon: Hiram Gash, MD;  Location: [place];  Service: Orthopedics;  Laterality: Right;   There are no problems to display for this patient.   REFERRING DIAG: ACL reconstruction and arthroscopy of R knee  THERAPY DIAG:  Acute pain of right knee  Stiffness of right knee, not elsewhere classified  Difficulty in walking, not elsewhere classified  Rationale for Evaluation and Treatment Rehabilitation  PERTINENT HISTORY: Patient is a 21 year old male s/p ACL reconstruction with meniscal repair 12/01/21. Patient was injured with a twisting injury while playing basketball where he felt a pop and was unable to bear weight. Protocol not provided however [name] has protocol from clinic: knee extension lock for one week, d/c brace by 4 weeks if quad control appropriate. Progress PROM AAROM and AROM as tolerated for 0-6 weeks; with limitation of 0-90 for first 4 weeks. Still having nausea s/p surgery.   PRECAUTIONS: ACL  protocol  SUBJECTIVE: Patient sees physician tomorrow about if he needs procedure.   PAIN:  Are you having pain? Yes: NPRS scale: 3/10 Pain location: Rt Patella  Pain description: post surgical Aggravating factors: movement Relieving factors: elevating, ice, rest  INTERVENTION  Manual:  Knee flexion/extension PROM 10x 10 second holds; performed in prone this session  Knee flexion PROM 10x 10 seconds supine ; 10x prone  Patella mobilizations 20x 5 second mobilizations anterior, posterior Ice cup massage x5 minutes     therEx:   Bicycle: forward 10x, backwards 10x; 2 sets ;   Supine:   Quad set 10x with focus on quad activation rather than compensatory strategies ; hold 5 seconds   Heel slide 10x with overpressure and use of sliding disc     Standing:   TRX lunge 10x each LE TRX squat with UE support 10x 5 second holds TRX single limb squat RLE 8x    Stool: forward/backwards propelling 60 ft x 2 trials each direction for 4 total.    Trigger Point Dry Needling (TDN), unbilled Education performed with patient regarding potential benefit of TDN. Reviewed precautions and risks with patient. Reviewed special precautions/risks over lung fields which include pneumothorax. Reviewed signs and symptoms of pneumothorax and advised pt to go to ER immediately if these symptoms develop advise them of dry needling treatment. Extensive time spent with pt to ensure full understanding of TDN risks. Pt provided verbal consent to treatment. TDN performed to  with 0.3 x 60 single needle placements with local twitch response (LTR). Pistoning technique utilized. Improved pain-free motion following intervention. R quad x 2 minutes   PATIENT EDUCATION: Education details: exercise technique, body mechanics, protocol Person educated: Patient Education method: Explanation, Demonstration, Tactile cues, and Verbal cues Education comprehension: verbalized understanding, returned demonstration, verbal  cues required, and tactile cues required   HOME EXERCISE PROGRAM: Access Code: [email or web address] URL: [email or web address] Date: 12/15/2021 Prepared by: [name]  Exercises - Long Sitting Quad Set  - 1 x daily - 7 x weekly - 2 sets - 10 reps - 5 hold - Active Straight Leg Raise with Quad Set  - 1 x daily - 7 x weekly - 2 sets - 10 reps - 5 hold - Mini Squat with Counter Support  - 1 x daily - 7 x weekly - 2 sets - 10 reps - 5 hold - Side Lunge with Counter Support  - 1 x daily - 7 x weekly - 2 sets - 10 reps - 5 hold - Seated Ankle Alphabet  - 1 x daily - 7 x weekly - 2 sets - 10 reps - 5 hold   PT Short Term Goals       PT SHORT TERM GOAL #1   Title Patient will be independent in home exercise program to improve strength/mobility for better functional independence with ADLs.    Baseline 6/12; HEP given 7/20 : HEP compliant    Time 4    Status Achieved   Target Date 01/02/22      PT SHORT TERM GOAL #2   Title Patient will report a worst pain of 5/10 on VAS in R knee to improve tolerance with ADLs and reduced symptoms with activities.    Baseline 6/12: 8/10 7/20: 6/10 8/8: 5/10    Time 4    Period Weeks    Status MET   Target Date 01/02/22      PT SHORT TERM GOAL #3   Title Patient will increase 10 meter walk test to >1.62ms with LRAD as to improve gait speed for better community ambulation and to reduce fall risk.    Baseline 6/12:0.77 with crutches 7/20: 1.6 m/s    Time 4    Period Weeks    Status Achieved   Target Date 01/02/22      PT SHORT TERM GOAL #4   Title --    Baseline --    Time --    Period --    Status --    Target Date --      PT SHORT TERM GOAL #5   Title --    Baseline --    Time --    Period --    Status --    Target Date --              PT Long Term Goals      PT LONG TERM GOAL #1   Title Patient will increase FOTO score to equal to or greater than  71   to demonstrate statistically significant improvement in mobility and  quality of life.    Baseline 6/12: 30 7/20: 51 8/8: 65%    Time 8    Period Weeks    Status Partially Met   Target Date 03/28/2022       PT LONG TERM GOAL #2   Title Patient will report a worst pain of 3/10 on VAS in  R knee  to improve tolerance with ADLs and reduced  symptoms with activities.    Baseline 6/12: 8/10 pain 7/20: 6/10 8/8: 5/10    Time 8    Period Weeks    Status Partially Met   Target Date 03/28/2022       PT LONG TERM GOAL #3   Title Patient will increase lower extremity functional scale to >60/80 to demonstrate improved functional mobility and increased tolerance with ADLs.    Baseline 6/12: 19/80 7/20: 45/80 8/8: 49/80    Time 8    Period Weeks    Status Partially Met   Target Date 03/28/2022       PT LONG TERM GOAL #4   Title Patient will improve R knee ROM (neutral to 120 degrees) for return to PLOF and safe functional mobility.    Baseline 6/12: locked in extension 7/20: seated 85 supine AROM: 75    extension: AROM -5 PROM -2 8/8:  supine AROM 75 PROM 80;   seated: AROM 73 PROM 86    Time 8    Period Weeks    Status Partially Met   Target Date 03/28/2022       PT LONG TERM GOAL #5   Title Patient will increase six minute walk test distance to >1000 for progression to community ambulator and improve gait ability    Baseline 6/12: unable to test 7/20: 1340 w/o brace 8/8: 1690 ft    Time 8    Period Weeks    Status MET   Target Date 03/28/2022     PT LONG TERM GOAL #6  Title Patient will return to sport and running 3 miles without pain increase for return to PLOF   Baseline 8/8: unable to play or run  Time 8   Period Weeks   Status NEW  Target Date 03/28/2022              Plan       Clinical Impression Statement Patient presents with excellent motivation. He is aware he  may need procedure to remove scar tissue as flexion continues to be limited at this time. His strength and ability to perform close chained interventions has greatly  improved but flexion range continues to be limited. Patient will benefit from skilled physical therapy to reduce pain, improve ROM, improve strength and mobility for return to PLOF.    Personal Factors and Comorbidities Profession;Transportation    Examination-Activity Limitations Bathing;Bed Mobility;Bend;Caring for Others;Carry;Dressing;Hygiene/Grooming;Lift;Locomotion Level;Toileting;Stand;Stairs;Squat;Sit;Transfers    Examination-Participation Restrictions Cleaning;Community Activity;Driving;Meal Prep;Laundry;Occupation;Shop;Yard Work;Volunteer    Stability/Clinical Decision Making Stable/Uncomplicated    Rehab Potential Good    PT Frequency 2x / week    PT Duration 8 weeks    PT Treatment/Interventions ADLs/Self Care Home Management;Cryotherapy;Electrical Stimulation;Iontophoresis 41m/ml Dexamethasone;Moist Heat;Gait training;DME Instruction;Stair training;Functional mobility training;Therapeutic activities;Therapeutic exercise;Patient/family education;Neuromuscular re-education;Balance training;Orthotic Fit/Training;Manual techniques;Passive range of motion;Scar mobilization;Manual lymph drainage;Dry needling;Energy conservation;Splinting;Taping    PT Next Visit Plan    PT Home Exercise Plan see above    Consulted and Agree with Plan of Care Patient            MJanna ArchPT    02/09/2022, 8:03 AM

## 2022-02-09 ENCOUNTER — Ambulatory Visit: Payer: 59

## 2022-02-09 DIAGNOSIS — M25561 Pain in right knee: Secondary | ICD-10-CM

## 2022-02-09 DIAGNOSIS — M25661 Stiffness of right knee, not elsewhere classified: Secondary | ICD-10-CM

## 2022-02-09 DIAGNOSIS — R262 Difficulty in walking, not elsewhere classified: Secondary | ICD-10-CM

## 2022-02-13 NOTE — Therapy (Signed)
OUTPATIENT PHYSICAL THERAPY TREATMENT NOTE  Patient Name: Douglas Adams MRN: [id number] DOB:December 07, 2000, 21 y.o., male Today's Date: 02/14/2022  PCP: Saddie Benders MD  REFERRING PROVIDER: Saddie Benders MD   PT End of Session - 02/14/22 0759     Visit Number 17    Number of Visits 29    Date for PT Re-Evaluation 03/28/22    Authorization Type [employer]    Authorization Time Period 01/31/22-03/28/22    Progress Note Due on Visit 20    PT Start Time 0800    PT Stop Time 0844    PT Time Calculation (min) 44 min    Activity Tolerance Patient tolerated treatment well    Behavior During Therapy [facility] for tasks assessed/performed                 Past Medical History:  Diagnosis Date   Allergies    Past Surgical History:  Procedure Laterality Date   ADENOIDECTOMY AND MYRINGOTOMY WITH TUBE PLACEMENT Bilateral 2007   ANTERIOR CRUCIATE LIGAMENT REPAIR Right 12/01/2021   Procedure: RECONSTRUCTION ANTERIOR CRUCIATE LIGAMENT (ACL);  Surgeon: Hiram Gash, MD;  Location: [place];  Service: Orthopedics;  Laterality: Right;   There are no problems to display for this patient.   REFERRING DIAG: ACL reconstruction and arthroscopy of R knee  THERAPY DIAG:  Acute pain of right knee  Stiffness of right knee, not elsewhere classified  Difficulty in walking, not elsewhere classified  Rationale for Evaluation and Treatment Rehabilitation  PERTINENT HISTORY: Patient is a 21 year old male s/p ACL reconstruction with meniscal repair 12/01/21. Patient was injured with a twisting injury while playing basketball where he felt a pop and was unable to bear weight. Protocol not provided however [name] has protocol from clinic: knee extension lock for one week, d/c brace by 4 weeks if quad control appropriate. Progress PROM AAROM and AROM as tolerated for 0-6 weeks; with limitation of 0-90 for first 4 weeks. Still having nausea s/p surgery.   PRECAUTIONS: ACL  protocol  SUBJECTIVE: Patient will be getting a manipulation on September 9th.   PAIN:  Are you having pain? Yes: NPRS scale: 3/10 Pain location: Rt Patella  Pain description: post surgical Aggravating factors: movement Relieving factors: elevating, ice, rest  INTERVENTION  Manual:  Knee flexion/extension PROM 10x 10 second holds; performed in prone this session  Knee flexion PROM 10x 10 seconds supine ; 10x prone  Patella mobilizations 20x 5 second mobilizations anterior, posterior Ice cup massage x5 minutes     therEx:   Bicycle: forward 10x, backwards 10x; 2 sets ;   Supine: Modified single limb bridge 15x     Standing:   TRX lateral lunge 10x each LE TRX squat with UE support 10x 5 second holds TRX single limb squat RLE 8x   6" step: -step up onto second step into crane pose and then back to base x 10 RLE -gastroc stretch into heel raise 15x  Stool: forward/backwards propelling 60 ft x 2 trials each direction for 4 total.      PATIENT EDUCATION: Education details: exercise technique, body mechanics, protocol Person educated: Patient Education method: Explanation, Demonstration, Tactile cues, and Verbal cues Education comprehension: verbalized understanding, returned demonstration, verbal cues required, and tactile cues required   HOME EXERCISE PROGRAM: Access Code: [email or web address] URL: [email or web address] Date: 12/15/2021 Prepared by: [name]  Exercises - Long Sitting Quad Set  - 1 x daily - 7 x weekly - 2 sets - 10 reps -  5 hold - Active Straight Leg Raise with Quad Set  - 1 x daily - 7 x weekly - 2 sets - 10 reps - 5 hold - Mini Squat with Counter Support  - 1 x daily - 7 x weekly - 2 sets - 10 reps - 5 hold - Side Lunge with Counter Support  - 1 x daily - 7 x weekly - 2 sets - 10 reps - 5 hold - Seated Ankle Alphabet  - 1 x daily - 7 x weekly - 2 sets - 10 reps - 5 hold   PT Short Term Goals       PT SHORT TERM GOAL #1   Title  Patient will be independent in home exercise program to improve strength/mobility for better functional independence with ADLs.    Baseline 6/12; HEP given 7/20 : HEP compliant    Time 4    Status Achieved   Target Date 01/02/22      PT SHORT TERM GOAL #2   Title Patient will report a worst pain of 5/10 on VAS in R knee to improve tolerance with ADLs and reduced symptoms with activities.    Baseline 6/12: 8/10 7/20: 6/10 8/8: 5/10    Time 4    Period Weeks    Status MET   Target Date 01/02/22      PT SHORT TERM GOAL #3   Title Patient will increase 10 meter walk test to >1.51ms with LRAD as to improve gait speed for better community ambulation and to reduce fall risk.    Baseline 6/12:0.77 with crutches 7/20: 1.6 m/s    Time 4    Period Weeks    Status Achieved   Target Date 01/02/22      PT SHORT TERM GOAL #4   Title --    Baseline --    Time --    Period --    Status --    Target Date --      PT SHORT TERM GOAL #5   Title --    Baseline --    Time --    Period --    Status --    Target Date --              PT Long Term Goals      PT LONG TERM GOAL #1   Title Patient will increase FOTO score to equal to or greater than  71   to demonstrate statistically significant improvement in mobility and quality of life.    Baseline 6/12: 30 7/20: 51 8/8: 65%    Time 8    Period Weeks    Status Partially Met   Target Date 03/28/2022       PT LONG TERM GOAL #2   Title Patient will report a worst pain of 3/10 on VAS in  R knee  to improve tolerance with ADLs and reduced symptoms with activities.    Baseline 6/12: 8/10 pain 7/20: 6/10 8/8: 5/10    Time 8    Period Weeks    Status Partially Met   Target Date 03/28/2022       PT LONG TERM GOAL #3   Title Patient will increase lower extremity functional scale to >60/80 to demonstrate improved functional mobility and increased tolerance with ADLs.    Baseline 6/12: 19/80 7/20: 45/80 8/8: 49/80    Time 8    Period  Weeks    Status Partially Met   Target Date 03/28/2022  PT LONG TERM GOAL #4   Title Patient will improve R knee ROM (neutral to 120 degrees) for return to PLOF and safe functional mobility.    Baseline 6/12: locked in extension 7/20: seated 85 supine AROM: 75    extension: AROM -5 PROM -2 8/8:  supine AROM 75 PROM 80;   seated: AROM 73 PROM 86    Time 8    Period Weeks    Status Partially Met   Target Date 03/28/2022       PT LONG TERM GOAL #5   Title Patient will increase six minute walk test distance to >1000 for progression to community ambulator and improve gait ability    Baseline 6/12: unable to test 7/20: 1340 w/o brace 8/8: 1690 ft    Time 8    Period Weeks    Status MET   Target Date 03/28/2022     PT LONG TERM GOAL #6  Title Patient will return to sport and running 3 miles without pain increase for return to PLOF   Baseline 8/8: unable to play or run  Time 8   Period Weeks   Status NEW  Target Date 03/28/2022              Plan       Clinical Impression Statement Patient presents with excellent motivation throughout physical therapy session. He will be undergoing a manipulation for increased ROM on September 9th. Strengthening and ROM tolerated with slight pain increase. Single limb close chained tolerated well without pain just fatigue.   Patient will benefit from skilled physical therapy to reduce pain, improve ROM, improve strength and mobility for return to PLOF.    Personal Factors and Comorbidities Profession;Transportation    Examination-Activity Limitations Bathing;Bed Mobility;Bend;Caring for Others;Carry;Dressing;Hygiene/Grooming;Lift;Locomotion Level;Toileting;Stand;Stairs;Squat;Sit;Transfers    Examination-Participation Restrictions Cleaning;Community Activity;Driving;Meal Prep;Laundry;Occupation;Shop;Yard Work;Volunteer    Stability/Clinical Decision Making Stable/Uncomplicated    Rehab Potential Good    PT Frequency 2x / week    PT Duration  8 weeks    PT Treatment/Interventions ADLs/Self Care Home Management;Cryotherapy;Electrical Stimulation;Iontophoresis 57m/ml Dexamethasone;Moist Heat;Gait training;DME Instruction;Stair training;Functional mobility training;Therapeutic activities;Therapeutic exercise;Patient/family education;Neuromuscular re-education;Balance training;Orthotic Fit/Training;Manual techniques;Passive range of motion;Scar mobilization;Manual lymph drainage;Dry needling;Energy conservation;Splinting;Taping    PT Next Visit Plan    PT Home Exercise Plan see above    Consulted and Agree with Plan of Care Patient            MJanna ArchPT    02/14/2022, 8:51 AM

## 2022-02-14 ENCOUNTER — Ambulatory Visit: Payer: 59

## 2022-02-14 DIAGNOSIS — R262 Difficulty in walking, not elsewhere classified: Secondary | ICD-10-CM

## 2022-02-14 DIAGNOSIS — M25661 Stiffness of right knee, not elsewhere classified: Secondary | ICD-10-CM

## 2022-02-14 DIAGNOSIS — M25561 Pain in right knee: Secondary | ICD-10-CM | POA: Diagnosis not present

## 2022-02-15 NOTE — Therapy (Signed)
OUTPATIENT PHYSICAL THERAPY TREATMENT NOTE  Patient Name: Douglas Adams MRN: [id number] DOB:[birth year], 21 y.o., male Today's Date: 02/16/2022  PCP: Saddie Benders MD  REFERRING PROVIDER: Saddie Benders MD   PT End of Session - 02/16/22 0717     Visit Number 18    Number of Visits 29    Date for PT Re-Evaluation 03/28/22    Authorization Type [employer]    Authorization Time Period 01/31/22-03/28/22    Progress Note Due on Visit 20    PT Start Time 0716    PT Stop Time 0759    PT Time Calculation (min) 43 min    Activity Tolerance Patient tolerated treatment well    Behavior During Therapy [facility] for tasks assessed/performed                  Past Medical History:  Diagnosis Date   Allergies    Past Surgical History:  Procedure Laterality Date   ADENOIDECTOMY AND MYRINGOTOMY WITH TUBE PLACEMENT Bilateral 2007   ANTERIOR CRUCIATE LIGAMENT REPAIR Right 12/01/2021   Procedure: RECONSTRUCTION ANTERIOR CRUCIATE LIGAMENT (ACL);  Surgeon: Hiram Gash, MD;  Location: [place];  Service: Orthopedics;  Laterality: Right;   There are no problems to display for this patient.   REFERRING DIAG: ACL reconstruction and arthroscopy of R knee  THERAPY DIAG:  Acute pain of right knee  Stiffness of right knee, not elsewhere classified  Difficulty in walking, not elsewhere classified  Rationale for Evaluation and Treatment Rehabilitation  PERTINENT HISTORY: Patient is a 21 year old male s/p ACL reconstruction with meniscal repair 12/01/21. Patient was injured with a twisting injury while playing basketball where he felt a pop and was unable to bear weight. Protocol not provided however [name] has protocol from clinic: knee extension lock for one week, d/c brace by 4 weeks if quad control appropriate. Progress PROM AAROM and AROM as tolerated for 0-6 weeks; with limitation of 0-90 for first 4 weeks. Still having nausea s/p surgery.   PRECAUTIONS: ACL  protocol  SUBJECTIVE: Patient had church last night, will be helping with varsity basketball practice tomorrow.   PAIN:  Are you having pain? Yes: NPRS scale: 4/10 Pain location: Rt Patella  Pain description: post surgical Aggravating factors: movement Relieving factors: elevating, ice, rest  INTERVENTION  Manual:  Knee flexion/extension PROM 10x 10 second holds; performed in prone this session  Knee flexion PROM 10x 10 seconds supine ; 10x prone  Patella mobilizations 20x 5 second mobilizations anterior, posterior Ice cup massage x5 minutes     therEx:   Bicycle: forward 10x, backwards 10x; 2 sets     Standing:   TRX lateral lunge 10x each LE TRX squat with UE support 10x 5 second holds Lateral squat walk 20 ft x 2 trials Monster walk forwards 2x30 ft; backwards 2x 30 ft  Seated: Soccer ball hamstring curl for increasing flexion 15x   6" step: -step up onto second step into crane pose and then back to base x 10 RLE      PATIENT EDUCATION: Education details: exercise technique, body mechanics, protocol Person educated: Patient Education method: Explanation, Demonstration, Tactile cues, and Verbal cues Education comprehension: verbalized understanding, returned demonstration, verbal cues required, and tactile cues required   HOME EXERCISE PROGRAM: Access Code: [email or web address] URL: [email or web address] Date: 12/15/2021 Prepared by: [name]  Exercises - Long Sitting Quad Set  - 1 x daily - 7 x weekly - 2 sets - 10 reps - 5 hold -  Active Straight Leg Raise with Quad Set  - 1 x daily - 7 x weekly - 2 sets - 10 reps - 5 hold - Mini Squat with Counter Support  - 1 x daily - 7 x weekly - 2 sets - 10 reps - 5 hold - Side Lunge with Counter Support  - 1 x daily - 7 x weekly - 2 sets - 10 reps - 5 hold - Seated Ankle Alphabet  - 1 x daily - 7 x weekly - 2 sets - 10 reps - 5 hold   PT Short Term Goals       PT SHORT TERM GOAL #1   Title Patient will  be independent in home exercise program to improve strength/mobility for better functional independence with ADLs.    Baseline 6/12; HEP given 7/20 : HEP compliant    Time 4    Status Achieved   Target Date 01/02/22      PT SHORT TERM GOAL #2   Title Patient will report a worst pain of 5/10 on VAS in R knee to improve tolerance with ADLs and reduced symptoms with activities.    Baseline 6/12: 8/10 7/20: 6/10 8/8: 5/10    Time 4    Period Weeks    Status MET   Target Date 01/02/22      PT SHORT TERM GOAL #3   Title Patient will increase 10 meter walk test to >1.68ms with LRAD as to improve gait speed for better community ambulation and to reduce fall risk.    Baseline 6/12:0.77 with crutches 7/20: 1.6 m/s    Time 4    Period Weeks    Status Achieved   Target Date 01/02/22      PT SHORT TERM GOAL #4   Title --    Baseline --    Time --    Period --    Status --    Target Date --      PT SHORT TERM GOAL #5   Title --    Baseline --    Time --    Period --    Status --    Target Date --              PT Long Term Goals      PT LONG TERM GOAL #1   Title Patient will increase FOTO score to equal to or greater than  71   to demonstrate statistically significant improvement in mobility and quality of life.    Baseline 6/12: 30 7/20: 51 8/8: 65%    Time 8    Period Weeks    Status Partially Met   Target Date 03/28/2022       PT LONG TERM GOAL #2   Title Patient will report a worst pain of 3/10 on VAS in  R knee  to improve tolerance with ADLs and reduced symptoms with activities.    Baseline 6/12: 8/10 pain 7/20: 6/10 8/8: 5/10    Time 8    Period Weeks    Status Partially Met   Target Date 03/28/2022       PT LONG TERM GOAL #3   Title Patient will increase lower extremity functional scale to >60/80 to demonstrate improved functional mobility and increased tolerance with ADLs.    Baseline 6/12: 19/80 7/20: 45/80 8/8: 49/80    Time 8    Period Weeks     Status Partially Met   Target Date 03/28/2022  PT LONG TERM GOAL #4   Title Patient will improve R knee ROM (neutral to 120 degrees) for return to PLOF and safe functional mobility.    Baseline 6/12: locked in extension 7/20: seated 85 supine AROM: 75    extension: AROM -5 PROM -2 8/8:  supine AROM 75 PROM 80;   seated: AROM 73 PROM 86    Time 8    Period Weeks    Status Partially Met   Target Date 03/28/2022       PT LONG TERM GOAL #5   Title Patient will increase six minute walk test distance to >1000 for progression to community ambulator and improve gait ability    Baseline 6/12: unable to test 7/20: 1340 w/o brace 8/8: 1690 ft    Time 8    Period Weeks    Status MET   Target Date 03/28/2022     PT LONG TERM GOAL #6  Title Patient will return to sport and running 3 miles without pain increase for return to PLOF   Baseline 8/8: unable to play or run  Time 8   Period Weeks   Status NEW  Target Date 03/28/2022              Plan       Clinical Impression Statement Patient presents with excellent motivation throughout physical therapy session. He continues to have limitations of flexion requiring extensive cueing for breathing for muscle relaxation. His close chained squats and strengthening interventions are progressing well with cueing for depth of squat.   Patient will benefit from skilled physical therapy to reduce pain, improve ROM, improve strength and mobility for return to PLOF.    Personal Factors and Comorbidities Profession;Transportation    Examination-Activity Limitations Bathing;Bed Mobility;Bend;Caring for Others;Carry;Dressing;Hygiene/Grooming;Lift;Locomotion Level;Toileting;Stand;Stairs;Squat;Sit;Transfers    Examination-Participation Restrictions Cleaning;Community Activity;Driving;Meal Prep;Laundry;Occupation;Shop;Yard Work;Volunteer    Stability/Clinical Decision Making Stable/Uncomplicated    Rehab Potential Good    PT Frequency 2x / week    PT  Duration 8 weeks    PT Treatment/Interventions ADLs/Self Care Home Management;Cryotherapy;Electrical Stimulation;Iontophoresis 18m/ml Dexamethasone;Moist Heat;Gait training;DME Instruction;Stair training;Functional mobility training;Therapeutic activities;Therapeutic exercise;Patient/family education;Neuromuscular re-education;Balance training;Orthotic Fit/Training;Manual techniques;Passive range of motion;Scar mobilization;Manual lymph drainage;Dry needling;Energy conservation;Splinting;Taping    PT Next Visit Plan    PT Home Exercise Plan see above    Consulted and Agree with Plan of Care Patient            MJanna ArchPT    02/16/2022, 7:59 AM

## 2022-02-16 ENCOUNTER — Ambulatory Visit: Payer: 59

## 2022-02-16 DIAGNOSIS — M25561 Pain in right knee: Secondary | ICD-10-CM

## 2022-02-16 DIAGNOSIS — M25661 Stiffness of right knee, not elsewhere classified: Secondary | ICD-10-CM

## 2022-02-16 DIAGNOSIS — R262 Difficulty in walking, not elsewhere classified: Secondary | ICD-10-CM

## 2022-02-20 NOTE — Therapy (Signed)
OUTPATIENT PHYSICAL THERAPY TREATMENT NOTE  Patient Name: Douglas Adams MRN: [id number] DOB:12-Mar-2001, 21 y.o., male Today's Date: 02/21/2022  PCP: Saddie Benders MD  REFERRING PROVIDER: Saddie Benders MD   PT End of Session - 02/21/22 0716     Visit Number 19    Number of Visits 29    Date for PT Re-Evaluation 03/28/22    Authorization Type [employer]    Authorization Time Period 01/31/22-03/28/22    Progress Note Due on Visit 20    PT Start Time 0715    PT Stop Time 0759    PT Time Calculation (min) 44 min    Activity Tolerance Patient tolerated treatment well    Behavior During Therapy [facility] for tasks assessed/performed                   Past Medical History:  Diagnosis Date   Allergies    Past Surgical History:  Procedure Laterality Date   ADENOIDECTOMY AND MYRINGOTOMY WITH TUBE PLACEMENT Bilateral 2007   ANTERIOR CRUCIATE LIGAMENT REPAIR Right 12/01/2021   Procedure: RECONSTRUCTION ANTERIOR CRUCIATE LIGAMENT (ACL);  Surgeon: Hiram Gash, MD;  Location: [facility];  Service: Orthopedics;  Laterality: Right;   There are no problems to display for this patient.   REFERRING DIAG: ACL reconstruction and arthroscopy of R knee  THERAPY DIAG:  Acute pain of right knee  Stiffness of right knee, not elsewhere classified  Difficulty in walking, not elsewhere classified  Rationale for Evaluation and Treatment Rehabilitation  PERTINENT HISTORY: Patient is a 21 year old male s/p ACL reconstruction with meniscal repair 12/01/21. Patient was injured with a twisting injury while playing basketball where he felt a pop and was unable to bear weight. Protocol not provided however [name] has protocol from clinic: knee extension lock for one week, d/c brace by 4 weeks if quad control appropriate. Progress PROM AAROM and AROM as tolerated for 0-6 weeks; with limitation of 0-90 for first 4 weeks. Still having nausea s/p surgery.   PRECAUTIONS: ACL  protocol  SUBJECTIVE: Patient will have manipulation next week. Has been compliant with HEP.   PAIN:  Are you having pain? Yes: NPRS scale: 4/10 Pain location: Rt Patella  Pain description: post surgical Aggravating factors: movement Relieving factors: elevating, ice, rest  INTERVENTION  Manual:  Knee flexion/extension PROM 10x 10 second holds; performed in prone this session  Knee flexion PROM 10x 10 seconds supine ; 10x prone  Patella mobilizations 20x 5 second mobilizations anterior, posterior Ice cup massage x5 minutes     therEx:   Bicycle: forward 10x, backwards 10x; 2 sets     Standing:   TRX forward/backwards lunge 10x each LE TRX squat with UE support 10x 5 second holds        6" step: eccentric heel tap 10x each LE    Squat with 7lb dumbbell goblet style 15x  Stool: forward/backwards propelling 60 ft x 2 trials each direction for 4 total.    Prone: hamstring curl 15x  Supine: single limb bridge 12x     PATIENT EDUCATION: Education details: exercise technique, body mechanics, protocol Person educated: Patient Education method: Explanation, Demonstration, Tactile cues, and Verbal cues Education comprehension: verbalized understanding, returned demonstration, verbal cues required, and tactile cues required   HOME EXERCISE PROGRAM: Access Code: [email or web address] URL: [email or web address] Date: 12/15/2021 Prepared by: [name]  Exercises - Long Sitting Quad Set  - 1 x daily - 7 x weekly - 2 sets - 10 reps -  5 hold - Active Straight Leg Raise with Quad Set  - 1 x daily - 7 x weekly - 2 sets - 10 reps - 5 hold - Mini Squat with Counter Support  - 1 x daily - 7 x weekly - 2 sets - 10 reps - 5 hold - Side Lunge with Counter Support  - 1 x daily - 7 x weekly - 2 sets - 10 reps - 5 hold - Seated Ankle Alphabet  - 1 x daily - 7 x weekly - 2 sets - 10 reps - 5 hold   PT Short Term Goals       PT SHORT TERM GOAL #1   Title Patient will be  independent in home exercise program to improve strength/mobility for better functional independence with ADLs.    Baseline 6/12; HEP given 7/20 : HEP compliant    Time 4    Status Achieved   Target Date 01/02/22      PT SHORT TERM GOAL #2   Title Patient will report a worst pain of 5/10 on VAS in R knee to improve tolerance with ADLs and reduced symptoms with activities.    Baseline 6/12: 8/10 7/20: 6/10 8/8: 5/10    Time 4    Period Weeks    Status MET   Target Date 01/02/22      PT SHORT TERM GOAL #3   Title Patient will increase 10 meter walk test to >1.47ms with LRAD as to improve gait speed for better community ambulation and to reduce fall risk.    Baseline 6/12:0.77 with crutches 7/20: 1.6 m/s    Time 4    Period Weeks    Status Achieved   Target Date 01/02/22      PT SHORT TERM GOAL #4   Title --    Baseline --    Time --    Period --    Status --    Target Date --      PT SHORT TERM GOAL #5   Title --    Baseline --    Time --    Period --    Status --    Target Date --              PT Long Term Goals      PT LONG TERM GOAL #1   Title Patient will increase FOTO score to equal to or greater than  71   to demonstrate statistically significant improvement in mobility and quality of life.    Baseline 6/12: 30 7/20: 51 8/8: 65%    Time 8    Period Weeks    Status Partially Met   Target Date 03/28/2022       PT LONG TERM GOAL #2   Title Patient will report a worst pain of 3/10 on VAS in  R knee  to improve tolerance with ADLs and reduced symptoms with activities.    Baseline 6/12: 8/10 pain 7/20: 6/10 8/8: 5/10    Time 8    Period Weeks    Status Partially Met   Target Date 03/28/2022       PT LONG TERM GOAL #3   Title Patient will increase lower extremity functional scale to >60/80 to demonstrate improved functional mobility and increased tolerance with ADLs.    Baseline 6/12: 19/80 7/20: 45/80 8/8: 49/80    Time 8    Period Weeks    Status  Partially Met   Target Date 03/28/2022  PT LONG TERM GOAL #4   Title Patient will improve R knee ROM (neutral to 120 degrees) for return to PLOF and safe functional mobility.    Baseline 6/12: locked in extension 7/20: seated 85 supine AROM: 75    extension: AROM -5 PROM -2 8/8:  supine AROM 75 PROM 80;   seated: AROM 73 PROM 86    Time 8    Period Weeks    Status Partially Met   Target Date 03/28/2022       PT LONG TERM GOAL #5   Title Patient will increase six minute walk test distance to >1000 for progression to community ambulator and improve gait ability    Baseline 6/12: unable to test 7/20: 1340 w/o brace 8/8: 1690 ft    Time 8    Period Weeks    Status MET   Target Date 03/28/2022     PT LONG TERM GOAL #6  Title Patient will return to sport and running 3 miles without pain increase for return to PLOF   Baseline 8/8: unable to play or run  Time 8   Period Weeks   Status NEW  Target Date 03/28/2022              Plan       Clinical Impression Statement Strengthening of surgical limb continues to be area of progress as it is limited by ROM. Patient's knee flexion limited by adhesions and guarding, he will be having procedure next week for correction.   Patient will benefit from skilled physical therapy to reduce pain, improve ROM, improve strength and mobility for return to PLOF.    Personal Factors and Comorbidities Profession;Transportation    Examination-Activity Limitations Bathing;Bed Mobility;Bend;Caring for Others;Carry;Dressing;Hygiene/Grooming;Lift;Locomotion Level;Toileting;Stand;Stairs;Squat;Sit;Transfers    Examination-Participation Restrictions Cleaning;Community Activity;Driving;Meal Prep;Laundry;Occupation;Shop;Yard Work;Volunteer    Stability/Clinical Decision Making Stable/Uncomplicated    Rehab Potential Good    PT Frequency 2x / week    PT Duration 8 weeks    PT Treatment/Interventions ADLs/Self Care Home Management;Cryotherapy;Electrical  Stimulation;Iontophoresis 17m/ml Dexamethasone;Moist Heat;Gait training;DME Instruction;Stair training;Functional mobility training;Therapeutic activities;Therapeutic exercise;Patient/family education;Neuromuscular re-education;Balance training;Orthotic Fit/Training;Manual techniques;Passive range of motion;Scar mobilization;Manual lymph drainage;Dry needling;Energy conservation;Splinting;Taping    PT Next Visit Plan    PT Home Exercise Plan see above    Consulted and Agree with Plan of Care Patient            MJanna ArchPT    02/21/2022, 10:17 AM

## 2022-02-21 ENCOUNTER — Encounter (HOSPITAL_BASED_OUTPATIENT_CLINIC_OR_DEPARTMENT_OTHER): Payer: Self-pay | Admitting: Orthopaedic Surgery

## 2022-02-21 ENCOUNTER — Other Ambulatory Visit: Payer: Self-pay

## 2022-02-21 ENCOUNTER — Ambulatory Visit: Payer: 59

## 2022-02-21 DIAGNOSIS — M25561 Pain in right knee: Secondary | ICD-10-CM

## 2022-02-21 DIAGNOSIS — R262 Difficulty in walking, not elsewhere classified: Secondary | ICD-10-CM

## 2022-02-21 DIAGNOSIS — M25661 Stiffness of right knee, not elsewhere classified: Secondary | ICD-10-CM

## 2022-02-22 NOTE — Therapy (Signed)
OUTPATIENT PHYSICAL THERAPY TREATMENT NOTE/ Physical Therapy Progress Note   Dates of reporting period  01/12/22   to   02/23/22   Patient Name: Douglas Adams MRN: [id number] DOB:2001-01-28, 21 y.o., male Today's Date: 02/23/2022  PCP: Saddie Benders MD  REFERRING PROVIDER: Saddie Benders MD   PT End of Session - 02/23/22 0716     Visit Number 20    Number of Visits 29    Date for PT Re-Evaluation 03/28/22    Authorization Type [employer]    Authorization Time Period 01/31/22-03/28/22    Progress Note Due on Visit 20    PT Start Time 0715    PT Stop Time 0759    PT Time Calculation (min) 44 min    Activity Tolerance Patient tolerated treatment well    Behavior During Therapy [facility] for tasks assessed/performed                    Past Medical History:  Diagnosis Date   Allergies    PONV (postoperative nausea and vomiting) 12/01/2021   pt's with extended recovery- vomiting x 3 hrs.   Past Surgical History:  Procedure Laterality Date   ADENOIDECTOMY AND MYRINGOTOMY WITH TUBE PLACEMENT Bilateral 2007   ANTERIOR CRUCIATE LIGAMENT REPAIR Right 12/01/2021   Procedure: RECONSTRUCTION ANTERIOR CRUCIATE LIGAMENT (ACL);  Surgeon: Hiram Gash, MD;  Location: [place];  Service: Orthopedics;  Laterality: Right;   There are no problems to display for this patient.   REFERRING DIAG: ACL reconstruction and arthroscopy of R knee  THERAPY DIAG:  Acute pain of right knee  Stiffness of right knee, not elsewhere classified  Difficulty in walking, not elsewhere classified  Rationale for Evaluation and Treatment Rehabilitation  PERTINENT HISTORY: Patient is a 21 year old male s/p ACL reconstruction with meniscal repair 12/01/21. Patient was injured with a twisting injury while playing basketball where he felt a pop and was unable to bear weight. Protocol not provided however [name] has protocol from clinic: knee extension lock for one week, d/c brace by 4  weeks if quad control appropriate. Progress PROM AAROM and AROM as tolerated for 0-6 weeks; with limitation of 0-90 for first 4 weeks. Still having nausea s/p surgery.   PRECAUTIONS: ACL protocol  SUBJECTIVE: Patient reports some pain in non surgical knee. Aware today is goals day.   PAIN:  Are you having pain? Yes: NPRS scale: 4/10 Pain location: Rt Patella  Pain description: post surgical Aggravating factors: movement Relieving factors: elevating, ice, rest  INTERVENTION Goals:  Examined, see below for details  Manual:  Knee flexion/extension PROM 10x 10 second holds; performed in prone this session  Knee flexion PROM 10x 10 seconds supine ; 10x prone  Patella mobilizations 20x 5 second mobilizations anterior, posterior Ice cup massage x5 minutes     therEx:   Bicycle: forward 10x, backwards 10x; 2 sets     Standing:   TRX crane lunge 10x each LE TRX squat with UE support 10x 5 second holds        Posterior lunge with UE support 10x each LE, stabilization/cueing to press laterally into PT hand      Seated: contract relax 5x 5 for progressive knee flexion   Prone: hamstring curl 15x     PATIENT EDUCATION: Education details: exercise technique, body mechanics, protocol Person educated: Patient Education method: Explanation, Demonstration, Tactile cues, and Verbal cues Education comprehension: verbalized understanding, returned demonstration, verbal cues required, and tactile cues required  HOME EXERCISE PROGRAM: Access Code: [email or web address] URL: [email or web address] Date: 12/15/2021 Prepared by: [name]  Exercises - Long Sitting Quad Set  - 1 x daily - 7 x weekly - 2 sets - 10 reps - 5 hold - Active Straight Leg Raise with Quad Set  - 1 x daily - 7 x weekly - 2 sets - 10 reps - 5 hold - Mini Squat with Counter Support  - 1 x daily - 7 x weekly - 2 sets - 10 reps - 5 hold - Side Lunge with Counter Support  - 1 x daily - 7 x weekly - 2 sets - 10  reps - 5 hold - Seated Ankle Alphabet  - 1 x daily - 7 x weekly - 2 sets - 10 reps - 5 hold   PT Short Term Goals       PT SHORT TERM GOAL #1   Title Patient will be independent in home exercise program to improve strength/mobility for better functional independence with ADLs.    Baseline 6/12; HEP given 7/20 : HEP compliant    Time 4    Status Achieved   Target Date 01/02/22      PT SHORT TERM GOAL #2   Title Patient will report a worst pain of 5/10 on VAS in R knee to improve tolerance with ADLs and reduced symptoms with activities.    Baseline 6/12: 8/10 7/20: 6/10 8/8: 5/10    Time 4    Period Weeks    Status MET   Target Date 01/02/22      PT SHORT TERM GOAL #3   Title Patient will increase 10 meter walk test to >1.61ms with LRAD as to improve gait speed for better community ambulation and to reduce fall risk.    Baseline 6/12:0.77 with crutches 7/20: 1.6 m/s    Time 4    Period Weeks    Status Achieved   Target Date 01/02/22      PT SHORT TERM GOAL #4   Title --    Baseline --    Time --    Period --    Status --    Target Date --      PT SHORT TERM GOAL #5   Title --    Baseline --    Time --    Period --    Status --    Target Date --              PT Long Term Goals      PT LONG TERM GOAL #1   Title Patient will increase FOTO score to equal to or greater than  71   to demonstrate statistically significant improvement in mobility and quality of life.    Baseline 6/12: 30 7/20: 51 8/8: 65% 8/31: 64%   Time 8    Period Weeks    Status Partially Met   Target Date 03/28/2022       PT LONG TERM GOAL #2   Title Patient will report a worst pain of 3/10 on VAS in  R knee  to improve tolerance with ADLs and reduced symptoms with activities.    Baseline 6/12: 8/10 pain 7/20: 6/10 8/8: 5/10 8/31: 6/10    Time 8    Period Weeks    Status Partially Met   Target Date 03/28/2022       PT LONG TERM GOAL #3   Title Patient will increase lower extremity  functional scale to >60/80 to demonstrate  improved functional mobility and increased tolerance with ADLs.    Baseline 6/12: 19/80 7/20: 45/80 8/8: 49/80 8/31 55/80   Time 8    Period Weeks    Status Partially Met   Target Date 03/28/2022       PT LONG TERM GOAL #4   Title Patient will improve R knee ROM (neutral to 120 degrees) for return to PLOF and safe functional mobility.    Baseline 6/12: locked in extension 7/20: seated 85 supine AROM: 75    extension: AROM -5 PROM -2 8/8:  supine AROM 75 PROM 80;   seated: AROM 73 PROM 86 8/31: supine:AROM 75 PROM 84   sitting; AROM 79 PROM 92    Time 8    Period Weeks    Status Partially Met   Target Date 03/28/2022       PT LONG TERM GOAL #5   Title Patient will increase six minute walk test distance to >1000 for progression to community ambulator and improve gait ability    Baseline 6/12: unable to test 7/20: 1340 w/o brace 8/8: 1690 ft    Time 8    Period Weeks    Status MET   Target Date 03/28/2022     PT LONG TERM GOAL #6  Title Patient will return to sport and running 3 miles without pain increase for return to PLOF   Baseline 8/8: unable to play or run 8/31: unable to   Time 8   Period Weeks   Status On going  Target Date 03/28/2022                Plan       Clinical Impression Statement Patient continues to be limited with knee flexion however does have improvement in seated position after contract relax intervention. Pain and guarding continue to be present. Patient to have procedure next week due to limited progress with ROM and presence of scar tissue.  Patient's condition has the potential to improve in response to therapy. Maximum improvement is yet to be obtained. The anticipated improvement is attainable and reasonable in a generally predictable time.   Patient will benefit from skilled physical therapy to reduce pain, improve ROM, improve strength and mobility for return to PLOF.    Personal Factors and  Comorbidities Profession;Transportation    Examination-Activity Limitations Bathing;Bed Mobility;Bend;Caring for Others;Carry;Dressing;Hygiene/Grooming;Lift;Locomotion Level;Toileting;Stand;Stairs;Squat;Sit;Transfers    Examination-Participation Restrictions Cleaning;Community Activity;Driving;Meal Prep;Laundry;Occupation;Shop;Yard Work;Volunteer    Stability/Clinical Decision Making Stable/Uncomplicated    Rehab Potential Good    PT Frequency 2x / week    PT Duration 8 weeks    PT Treatment/Interventions ADLs/Self Care Home Management;Cryotherapy;Electrical Stimulation;Iontophoresis 20m/ml Dexamethasone;Moist Heat;Gait training;DME Instruction;Stair training;Functional mobility training;Therapeutic activities;Therapeutic exercise;Patient/family education;Neuromuscular re-education;Balance training;Orthotic Fit/Training;Manual techniques;Passive range of motion;Scar mobilization;Manual lymph drainage;Dry needling;Energy conservation;Splinting;Taping    PT Next Visit Plan    PT Home Exercise Plan see above    Consulted and Agree with Plan of Care Patient            MJanna ArchPT    02/23/2022, 8:04 AM

## 2022-02-23 ENCOUNTER — Ambulatory Visit: Payer: 59

## 2022-02-23 DIAGNOSIS — M25561 Pain in right knee: Secondary | ICD-10-CM

## 2022-02-23 DIAGNOSIS — M25661 Stiffness of right knee, not elsewhere classified: Secondary | ICD-10-CM

## 2022-02-23 DIAGNOSIS — R262 Difficulty in walking, not elsewhere classified: Secondary | ICD-10-CM

## 2022-02-28 ENCOUNTER — Ambulatory Visit: Payer: 59 | Attending: Orthopaedic Surgery

## 2022-02-28 DIAGNOSIS — M25561 Pain in right knee: Secondary | ICD-10-CM | POA: Insufficient documentation

## 2022-02-28 DIAGNOSIS — R262 Difficulty in walking, not elsewhere classified: Secondary | ICD-10-CM | POA: Insufficient documentation

## 2022-02-28 DIAGNOSIS — M25661 Stiffness of right knee, not elsewhere classified: Secondary | ICD-10-CM | POA: Diagnosis present

## 2022-02-28 NOTE — Therapy (Signed)
OUTPATIENT PHYSICAL THERAPY TREATMENT NOTE   Dates of reporting period  01/12/22   to   02/23/22   Patient Name: Douglas Adams MRN: [id number] DOB:September 28, 2000, 21 y.o., male Today's Date: 02/28/2022  PCP: Saddie Benders MD  REFERRING PROVIDER: Saddie Benders MD   PT End of Session - 02/28/22 0723     Visit Number 21    Number of Visits 29    Date for PT Re-Evaluation 03/28/22    Authorization Type [employer]    Authorization Time Period 01/31/22-03/28/22    Progress Note Due on Visit 30    PT Start Time 0715    PT Stop Time 0755    PT Time Calculation (min) 40 min    Equipment Utilized During Treatment Other (comment);Right knee immobilizer    Activity Tolerance Patient tolerated treatment well    Behavior During Therapy [facility] for tasks assessed/performed                    Past Medical History:  Diagnosis Date   Allergies    PONV (postoperative nausea and vomiting) 12/01/2021   pt's with extended recovery- vomiting x 3 hrs.   Past Surgical History:  Procedure Laterality Date   ADENOIDECTOMY AND MYRINGOTOMY WITH TUBE PLACEMENT Bilateral 2007   ANTERIOR CRUCIATE LIGAMENT REPAIR Right 12/01/2021   Procedure: RECONSTRUCTION ANTERIOR CRUCIATE LIGAMENT (ACL);  Surgeon: Hiram Gash, MD;  Location: [place];  Service: Orthopedics;  Laterality: Right;   There are no problems to display for this patient.   REFERRING DIAG: ACL reconstruction and arthroscopy of R knee  THERAPY DIAG:  Acute pain of right knee  Stiffness of right knee, not elsewhere classified  Difficulty in walking, not elsewhere classified  Rationale for Evaluation and Treatment Rehabilitation  PERTINENT HISTORY: Patient is a 21 year old male s/p ACL reconstruction with meniscal repair 12/01/21. Patient was injured with a twisting injury while playing basketball where he felt a pop and was unable to bear weight. Protocol not provided however [name] has protocol from clinic: knee  extension lock for one week, d/c brace by 4 weeks if quad control appropriate. Progress PROM AAROM and AROM as tolerated for 0-6 weeks; with limitation of 0-90 for first 4 weeks. Pt goes for manip under anesth on 03/02/22.   PRECAUTIONS: ACL protocol  SUBJECTIVE: Feeling good. Played some golf recently. Pain mild, but stiff now hes off NSAIDs. Manip this coming Thursday.    PAIN:  Are you having pain? Yes: NPRS scale: 2/10 Pain location: Rt Patella  Pain description: post surgical Aggravating factors: movement Relieving factors: elevating, ice, rest  INTERVENTION therEx:   Bicycle: forward 10x, backwards 10x; 2 sets  Short sitting knee traction 10lb AW x5 minutes Knee flexion lunge stretch 5x45sec Seated hamstrings flexion on cable 9.5lb 15x3secH     TRX crane lunge 10x each LE TRX squat with UE support 10x 5 second holds                                     Cable TKE 17.5# x 15    Rt single leg ant step downs x15      PATIENT EDUCATION: Education details: exercise technique, body mechanics, protocol Person educated: Patient Education method: Explanation, Demonstration, Tactile cues, and Verbal cues Education comprehension: verbalized understanding, returned demonstration, verbal cues required, and tactile cues required   HOME EXERCISE PROGRAM: Access Code: [email or web address]  URL: [email or web address] Date: 12/15/2021 Prepared by: [name]  Exercises - Long Sitting Quad Set  - 1 x daily - 7 x weekly - 2 sets - 10 reps - 5 hold - Active Straight Leg Raise with Quad Set  - 1 x daily - 7 x weekly - 2 sets - 10 reps - 5 hold - Mini Squat with Counter Support  - 1 x daily - 7 x weekly - 2 sets - 10 reps - 5 hold - Side Lunge with Counter Support  - 1 x daily - 7 x weekly - 2 sets - 10 reps - 5 hold - Seated Ankle Alphabet  - 1 x daily - 7 x weekly - 2 sets - 10 reps - 5 hold   PT Short Term Goals       PT SHORT TERM GOAL #1   Title Patient will be independent in  home exercise program to improve strength/mobility for better functional independence with ADLs.    Baseline 6/12; HEP given 7/20 : HEP compliant    Time 4    Status Achieved   Target Date 01/02/22      PT SHORT TERM GOAL #2   Title Patient will report a worst pain of 5/10 on VAS in R knee to improve tolerance with ADLs and reduced symptoms with activities.    Baseline 6/12: 8/10 7/20: 6/10 8/8: 5/10    Time 4    Period Weeks    Status MET   Target Date 01/02/22      PT SHORT TERM GOAL #3   Title Patient will increase 10 meter walk test to >1.8ms with LRAD as to improve gait speed for better community ambulation and to reduce fall risk.    Baseline 6/12:0.77 with crutches 7/20: 1.6 m/s    Time 4    Period Weeks    Status Achieved   Target Date 01/02/22      PT SHORT TERM GOAL #4   Title --    Baseline --    Time --    Period --    Status --    Target Date --      PT SHORT TERM GOAL #5   Title --    Baseline --    Time --    Period --    Status --    Target Date --              PT Long Term Goals      PT LONG TERM GOAL #1   Title Patient will increase FOTO score to equal to or greater than  71   to demonstrate statistically significant improvement in mobility and quality of life.    Baseline 6/12: 30 7/20: 51 8/8: 65% 8/31: 64%   Time 8    Period Weeks    Status Partially Met   Target Date 03/28/2022       PT LONG TERM GOAL #2   Title Patient will report a worst pain of 3/10 on VAS in  R knee  to improve tolerance with ADLs and reduced symptoms with activities.    Baseline 6/12: 8/10 pain 7/20: 6/10 8/8: 5/10 8/31: 6/10    Time 8    Period Weeks    Status Partially Met   Target Date 03/28/2022       PT LONG TERM GOAL #3   Title Patient will increase lower extremity functional scale to >60/80 to demonstrate improved functional mobility and increased tolerance with  ADLs.    Baseline 6/12: 19/80 7/20: 45/80 8/8: 49/80 8/31 55/80   Time 8    Period  Weeks    Status Partially Met   Target Date 03/28/2022       PT LONG TERM GOAL #4   Title Patient will improve R knee ROM (neutral to 120 degrees) for return to PLOF and safe functional mobility.    Baseline 6/12: locked in extension 7/20: seated 85 supine AROM: 75    extension: AROM -5 PROM -2 8/8:  supine AROM 75 PROM 80;   seated: AROM 73 PROM 86 8/31: supine:AROM 75 PROM 84   sitting; AROM 79 PROM 92    Time 8    Period Weeks    Status Partially Met   Target Date 03/28/2022       PT LONG TERM GOAL #5   Title Patient will increase six minute walk test distance to >1000 for progression to community ambulator and improve gait ability    Baseline 6/12: unable to test 7/20: 1340 w/o brace 8/8: 1690 ft    Time 8    Period Weeks    Status MET   Target Date 03/28/2022     PT LONG TERM GOAL #6  Title Patient will return to sport and running 3 miles without pain increase for return to PLOF   Baseline 8/8: unable to play or run 8/31: unable to   Time 8   Period Weeks   Status On going  Target Date 03/28/2022                Plan       Clinical Impression Statement ROM remains restricted. Pain and strength are improving. Session tolerated well without any exacerbation of pain or swelling. Patient will benefit from skilled physical therapy to reduce pain, improve ROM, improve strength and mobility for return to PLOF.    Personal Factors and Comorbidities Profession;Transportation    Examination-Activity Limitations Bathing;Bed Mobility;Bend;Caring for Others;Carry;Dressing;Hygiene/Grooming;Lift;Locomotion Level;Toileting;Stand;Stairs;Squat;Sit;Transfers    Examination-Participation Restrictions Cleaning;Community Activity;Driving;Meal Prep;Laundry;Occupation;Shop;Yard Work;Volunteer    Stability/Clinical Decision Making Stable/Uncomplicated    Rehab Potential Good    PT Frequency 2x / week    PT Duration 8 weeks    PT Treatment/Interventions ADLs/Self Care Home  Management;Cryotherapy;Electrical Stimulation;Iontophoresis 20m/ml Dexamethasone;Moist Heat;Gait training;DME Instruction;Stair training;Functional mobility training;Therapeutic activities;Therapeutic exercise;Patient/family education;Neuromuscular re-education;Balance training;Orthotic Fit/Training;Manual techniques;Passive range of motion;Scar mobilization;Manual lymph drainage;Dry needling;Energy conservation;Splinting;Taping    PT Next Visit Plan    PT Home Exercise Plan see above    Consulted and Agree with Plan of Care Patient            AEtta GrandchildPT    02/28/2022, 7:36 AM   7:37 AM, 02/28/22 AEtta Grandchild PT, DPT Physical Therapist [facility] [phone]([facility]

## 2022-03-02 ENCOUNTER — Other Ambulatory Visit: Payer: Self-pay

## 2022-03-02 ENCOUNTER — Ambulatory Visit (HOSPITAL_BASED_OUTPATIENT_CLINIC_OR_DEPARTMENT_OTHER)
Admission: RE | Admit: 2022-03-02 | Discharge: 2022-03-02 | Disposition: A | Discharge status: Home / Self Care | Payer: 59 | Source: Ambulatory Visit | Attending: Orthopaedic Surgery | Admitting: Orthopaedic Surgery

## 2022-03-02 ENCOUNTER — Encounter (HOSPITAL_BASED_OUTPATIENT_CLINIC_OR_DEPARTMENT_OTHER): Payer: Self-pay | Admitting: Orthopaedic Surgery

## 2022-03-02 ENCOUNTER — Encounter (HOSPITAL_BASED_OUTPATIENT_CLINIC_OR_DEPARTMENT_OTHER)
Admission: RE | Disposition: A | Discharge status: Home / Self Care | Payer: Self-pay | Source: Ambulatory Visit | Attending: Orthopaedic Surgery

## 2022-03-02 ENCOUNTER — Ambulatory Visit (HOSPITAL_BASED_OUTPATIENT_CLINIC_OR_DEPARTMENT_OTHER): Payer: 59 | Admitting: Certified Registered"

## 2022-03-02 DIAGNOSIS — M24661 Ankylosis, right knee: Secondary | ICD-10-CM | POA: Insufficient documentation

## 2022-03-02 HISTORY — PX: LYSIS OF ADHESION: SHX5961

## 2022-03-02 HISTORY — PX: KNEE ARTHROSCOPY: SHX127

## 2022-03-02 SURGERY — LAPAROTOMY, FOR LYSIS OF ADHESIONS
Anesthesia: General | Site: Knee | Laterality: Right

## 2022-03-02 MED ORDER — LIDOCAINE HCL (CARDIAC) PF 100 MG/5ML IV SOSY
PREFILLED_SYRINGE | INTRAVENOUS | Status: DC | PRN
  Administered 2022-03-02: 100 mg via INTRAVENOUS

## 2022-03-02 MED ORDER — METHOCARBAMOL 500 MG PO TABS: 500.0000 mg | ORAL_TABLET | Freq: Three times a day (TID) | ORAL | 0 refills | Status: AC | PRN

## 2022-03-02 MED ORDER — ASPIRIN 81 MG PO CHEW: 81.0000 mg | CHEWABLE_TABLET | Freq: Two times a day (BID) | ORAL | 0 refills | Status: AC

## 2022-03-02 MED ORDER — APREPITANT 40 MG PO CAPS
40.0000 mg | ORAL_CAPSULE | Freq: Once | ORAL | Status: AC
  Administered 2022-03-02: 40 mg via ORAL

## 2022-03-02 MED ORDER — FENTANYL CITRATE (PF) 100 MCG/2ML IJ SOLN: 25.0000 ug | INTRAMUSCULAR | Status: DC | PRN

## 2022-03-02 MED ORDER — ONDANSETRON HCL 4 MG/2ML IJ SOLN
INTRAMUSCULAR | Status: AC
  Filled 2022-03-02: qty 2

## 2022-03-02 MED ORDER — TRIAMCINOLONE ACETONIDE 40 MG/ML IJ SUSP
INTRAMUSCULAR | Status: AC
Start: 2022-03-02 — End: ?
  Filled 2022-03-02: qty 5

## 2022-03-02 MED ORDER — PROPOFOL 10 MG/ML IV BOLUS
INTRAVENOUS | Status: DC | PRN
  Administered 2022-03-02: 200 mg via INTRAVENOUS

## 2022-03-02 MED ORDER — DEXAMETHASONE SODIUM PHOSPHATE 10 MG/ML IJ SOLN: 8.0000 mg | Freq: Once | INTRAMUSCULAR | Status: DC

## 2022-03-02 MED ORDER — LACTATED RINGERS IV SOLN: INTRAVENOUS | Status: DC | PRN

## 2022-03-02 MED ORDER — LIDOCAINE 2% (20 MG/ML) 5 ML SYRINGE
INTRAMUSCULAR | Status: AC
Start: 2022-03-02 — End: ?
  Filled 2022-03-02: qty 5

## 2022-03-02 MED ORDER — PROPOFOL 10 MG/ML IV BOLUS
INTRAVENOUS | Status: AC
  Filled 2022-03-02: qty 20

## 2022-03-02 MED ORDER — DEXAMETHASONE SODIUM PHOSPHATE 10 MG/ML IJ SOLN
INTRAMUSCULAR | Status: DC | PRN
  Administered 2022-03-02: 10 mg

## 2022-03-02 MED ORDER — KETOROLAC TROMETHAMINE 30 MG/ML IJ SOLN
INTRAMUSCULAR | Status: DC | PRN
  Administered 2022-03-02: 30 mg via INTRAVENOUS

## 2022-03-02 MED ORDER — LACTATED RINGERS IV SOLN: INTRAVENOUS | Status: DC

## 2022-03-02 MED ORDER — SCOPOLAMINE 1 MG/3DAYS TD PT72
MEDICATED_PATCH | TRANSDERMAL | Status: AC
Start: 2022-03-02 — End: ?
  Filled 2022-03-02: qty 1

## 2022-03-02 MED ORDER — CEFAZOLIN SODIUM-DEXTROSE 2-3 GM-%(50ML) IV SOLR
INTRAVENOUS | Status: DC | PRN
  Administered 2022-03-02: 2 g via INTRAVENOUS

## 2022-03-02 MED ORDER — DEXAMETHASONE SODIUM PHOSPHATE 10 MG/ML IJ SOLN
INTRAMUSCULAR | Status: AC
  Filled 2022-03-02: qty 1

## 2022-03-02 MED ORDER — PROPOFOL 500 MG/50ML IV EMUL
INTRAVENOUS | Status: DC | PRN
  Administered 2022-03-02: 200 ug/kg/min via INTRAVENOUS

## 2022-03-02 MED ORDER — MIDAZOLAM HCL 2 MG/2ML IJ SOLN
INTRAMUSCULAR | Status: DC | PRN
  Administered 2022-03-02: 2 mg via INTRAVENOUS

## 2022-03-02 MED ORDER — APREPITANT 40 MG PO CAPS
ORAL_CAPSULE | ORAL | Status: AC
  Filled 2022-03-02: qty 1

## 2022-03-02 MED ORDER — OXYCODONE HCL 5 MG PO TABS
5.0000 mg | ORAL_TABLET | Freq: Once | ORAL | Status: AC | PRN
  Administered 2022-03-02: 5 mg via ORAL

## 2022-03-02 MED ORDER — OXYCODONE HCL 5 MG PO TABS
ORAL_TABLET | ORAL | Status: AC
  Filled 2022-03-02: qty 1

## 2022-03-02 MED ORDER — SODIUM CHLORIDE 0.9 % IR SOLN
Status: DC | PRN
  Administered 2022-03-02: 2000 mL

## 2022-03-02 MED ORDER — MELOXICAM 15 MG PO TABS: 15.0000 mg | ORAL_TABLET | Freq: Every day | ORAL | 0 refills | Status: AC

## 2022-03-02 MED ORDER — PROMETHAZINE HCL 25 MG PO TABS: 25.0000 mg | ORAL_TABLET | Freq: Four times a day (QID) | ORAL | 0 refills | Status: AC | PRN

## 2022-03-02 MED ORDER — ONDANSETRON HCL 4 MG/2ML IJ SOLN: 4.0000 mg | Freq: Once | INTRAMUSCULAR | Status: DC | PRN

## 2022-03-02 MED ORDER — BUPIVACAINE HCL (PF) 0.25 % IJ SOLN
INTRAMUSCULAR | Status: DC | PRN
  Administered 2022-03-02: 20 mL

## 2022-03-02 MED ORDER — FENTANYL CITRATE (PF) 100 MCG/2ML IJ SOLN
INTRAMUSCULAR | Status: AC
  Filled 2022-03-02: qty 2

## 2022-03-02 MED ORDER — OXYCODONE HCL 5 MG PO TABS: ORAL_TABLET | ORAL | 0 refills | Status: AC

## 2022-03-02 MED ORDER — ACETAMINOPHEN 500 MG PO TABS: 1000.0000 mg | ORAL_TABLET | Freq: Three times a day (TID) | ORAL | 0 refills | Status: AC

## 2022-03-02 MED ORDER — DEXMEDETOMIDINE HCL IN NACL 200 MCG/50ML IV SOLN
INTRAVENOUS | Status: DC | PRN
  Administered 2022-03-02: 12 ug via INTRAVENOUS

## 2022-03-02 MED ORDER — CEFAZOLIN SODIUM-DEXTROSE 2-4 GM/100ML-% IV SOLN
INTRAVENOUS | Status: AC
  Filled 2022-03-02: qty 100

## 2022-03-02 MED ORDER — ONDANSETRON HCL 4 MG/2ML IJ SOLN
INTRAMUSCULAR | Status: DC | PRN
  Administered 2022-03-02: 4 mg via INTRAVENOUS

## 2022-03-02 MED ORDER — OXYCODONE HCL 5 MG/5ML PO SOLN: 5.0000 mg | Freq: Once | ORAL | Status: AC | PRN

## 2022-03-02 MED ORDER — SCOPOLAMINE 1 MG/3DAYS TD PT72
1.0000 | MEDICATED_PATCH | TRANSDERMAL | Status: DC
  Administered 2022-03-02: 1.5 mg via TRANSDERMAL

## 2022-03-02 MED ORDER — MIDAZOLAM HCL 2 MG/2ML IJ SOLN
INTRAMUSCULAR | Status: AC
  Filled 2022-03-02: qty 2

## 2022-03-02 MED ORDER — FENTANYL CITRATE (PF) 100 MCG/2ML IJ SOLN
INTRAMUSCULAR | Status: DC | PRN
  Administered 2022-03-02: 100 ug via INTRAVENOUS
  Administered 2022-03-02 (×2): 25 ug via INTRAVENOUS

## 2022-03-02 MED ORDER — CEFAZOLIN SODIUM-DEXTROSE 2-4 GM/100ML-% IV SOLN
2.0000 g | INTRAVENOUS | Status: AC
  Administered 2022-03-02: 2 g via INTRAVENOUS

## 2022-03-02 SURGICAL SUPPLY — 33 items
APL PRP STRL LF DISP 70% ISPRP (MISCELLANEOUS) ×1
BANDAGE ESMARK 6X9 LF (GAUZE/BANDAGES/DRESSINGS) IMPLANT
BLADE EXCALIBUR 4.0X13 (MISCELLANEOUS) IMPLANT
BNDG CMPR 9X6 STRL LF SNTH (GAUZE/BANDAGES/DRESSINGS)
BNDG ELASTIC 6X5.8 VLCR STR LF (GAUZE/BANDAGES/DRESSINGS) ×1 IMPLANT
BNDG ESMARK 6X9 LF (GAUZE/BANDAGES/DRESSINGS)
CHLORAPREP W/TINT 26 (MISCELLANEOUS) ×1 IMPLANT
CLSR STERI-STRIP ANTIMIC 1/2X4 (GAUZE/BANDAGES/DRESSINGS) ×1 IMPLANT
CUFF TOURN SGL QUICK 34 (TOURNIQUET CUFF) ×1
CUFF TRNQT CYL 34X4.125X (TOURNIQUET CUFF) ×1 IMPLANT
DISSECTOR 4.0MMX13CM CVD (MISCELLANEOUS) ×1 IMPLANT
DRAPE ARTHROSCOPY W/POUCH 90 (DRAPES) ×1 IMPLANT
DRAPE IMP U-DRAPE 54X76 (DRAPES) ×1 IMPLANT
DRAPE U-SHAPE 47X51 STRL (DRAPES) ×1 IMPLANT
GAUZE SPONGE 4X4 12PLY STRL (GAUZE/BANDAGES/DRESSINGS) ×1 IMPLANT
GLOVE BIO SURGEON STRL SZ 6.5 (GLOVE) ×1 IMPLANT
GLOVE BIOGEL PI IND STRL 6.5 (GLOVE) ×1 IMPLANT
GLOVE BIOGEL PI IND STRL 8 (GLOVE) ×1 IMPLANT
GLOVE ECLIPSE 8.0 STRL XLNG CF (GLOVE) ×2 IMPLANT
GOWN STRL REUS W/ TWL LRG LVL3 (GOWN DISPOSABLE) ×1 IMPLANT
GOWN STRL REUS W/TWL LRG LVL3 (GOWN DISPOSABLE) ×1
GOWN STRL REUS W/TWL XL LVL3 (GOWN DISPOSABLE) ×1 IMPLANT
KIT TURNOVER KIT B (KITS) ×1 IMPLANT
MANIFOLD NEPTUNE II (INSTRUMENTS) IMPLANT
NS IRRIG 1000ML POUR BTL (IV SOLUTION) IMPLANT
PACK ARTHROSCOPY DSU (CUSTOM PROCEDURE TRAY) ×1 IMPLANT
PORT APPOLLO RF 90DEGREE MULTI (SURGICAL WAND) IMPLANT
SLEEVE SCD COMPRESS KNEE MED (STOCKING) ×1 IMPLANT
SUT MNCRL AB 4-0 PS2 18 (SUTURE) ×1 IMPLANT
TOWEL GREEN STERILE FF (TOWEL DISPOSABLE) ×1 IMPLANT
TUBE CONNECTING 20X1/4 (TUBING) ×1 IMPLANT
TUBING ARTHROSCOPY IRRIG 16FT (MISCELLANEOUS) ×1 IMPLANT
WATER STERILE IRR 1000ML POUR (IV SOLUTION) ×1 IMPLANT

## 2022-03-02 NOTE — H&P (Signed)
PREOPERATIVE H&P  Chief Complaint: RIGHT KNEE ANKYLOSIS  HPI: Douglas Adams is a 21 y.o. male who is scheduled for, Procedure(s): LYSIS OF ADHESION/MANIPULATION.   Patient has a past medical history significant for PONV.   Patient has a right knee ACL reconstruction with loose body excision on  12-01-21. He struggled with stiffness after surgery. He had an injection which helped some but he was not able to progress significantly in regards to his motion.   Symptoms are rated as moderate to severe, and have been worsening.  This is significantly impairing activities of daily living.    Please see clinic note for further details on this patient's care.    He has elected for surgical management.   Past Medical History:  Diagnosis Date   Allergies    PONV (postoperative nausea and vomiting) 12/01/2021   pt's with extended recovery- vomiting x 3 hrs.   Past Surgical History:  Procedure Laterality Date   ADENOIDECTOMY AND MYRINGOTOMY WITH TUBE PLACEMENT Bilateral 2007   ANTERIOR CRUCIATE LIGAMENT REPAIR Right 12/01/2021   Procedure: RECONSTRUCTION ANTERIOR CRUCIATE LIGAMENT (ACL);  Surgeon: Bjorn Pippin, MD;  Location: [facility];  Service: Orthopedics;  Laterality: Right;   Social History   Socioeconomic History   Marital status: Single    Spouse name: Not on file   Number of children: Not on file   Years of education: Not on file   Highest education level: Not on file  Occupational History   Not on file  Tobacco Use   Smoking status: Never   Smokeless tobacco: Never  Vaping Use   Vaping Use: Never used  Substance and Sexual Activity   Alcohol use: No   Drug use: Never   Sexual activity: Not on file  Other Topics Concern   Not on file  Social History Narrative   Not on file   Social Determinants of Health   Financial Resource Strain: Not on file  Food Insecurity: Not on file  Transportation Needs: Not on file  Physical Activity: Not on file   Stress: Not on file  Social Connections: Not on file   History reviewed. No pertinent family history. Allergies  Allergen Reactions   Augmentin [Amoxicillin-Pot Clavulanate] Nausea And Vomiting   Prior to Admission medications   Medication Sig Start Date End Date Taking? Authorizing Provider  cetirizine (ZYRTEC) 10 MG tablet Take 10 mg by mouth daily.   Yes [provider]  montelukast (SINGULAIR) 10 MG tablet Take 10 mg by mouth at bedtime.   Yes [provider]  fluticasone (FLONASE) 50 MCG/ACT nasal spray Place 1 spray into the nose daily. 03/30/11 03/29/12  Georgiann Hahn, MD  meloxicam (MOBIC) 15 MG tablet Take 1 tablet (15 mg total) by mouth daily. For 2 weeks for pain and inflammation. Then take as needed 12/01/21   Vishruth Seoane, Jerald Kief, PA-C    ROS: All other systems have been reviewed and were otherwise negative with the exception of those mentioned in the HPI and as above.  Physical Exam: General: Alert, no acute distress Cardiovascular: No pedal edema Respiratory: No cyanosis, no use of accessory musculature GI: No organomegaly, abdomen is soft and non-tender Skin: No lesions in the area of chief complaint Neurologic: Sensation intact distally Psychiatric: Patient is competent for consent with normal mood and affect Lymphatic: No axillary or cervical lymphadenopathy  MUSCULOSKELETAL:  On examination the right knee is about 2 degrees short of full extension. With stretching he is able to  get there. His range of motion with flexion is about 70-75 degrees with a firm end point. Good endpoint on Lachmans.   Imaging: No images performed today.   Assessment: RIGHT KNEE ANKYLOSIS  Plan: Plan for Procedure(s): LYSIS OF ADHESION/MANIPULATION  We talked to the patient about his options. At this point we think it is appropriate  to consider a lysis of adhesions and manipulation under anesthesia.  He understands the risks and benefits including but limited to  fracture, rupture of a tendon or ligament. He wants to proceed. We will work on surgical scheduling and get therapy set up right afterwards.   The risks benefits and alternatives were discussed with the patient including but not limited to the risks of nonoperative treatment, versus surgical intervention including infection, bleeding, nerve injury,  blood clots, cardiopulmonary complications, morbidity, mortality, among others, and they were willing to proceed.   The patient acknowledged the explanation, agreed to proceed with the plan and consent was signed.   Operative Plan: Right knee arthroscopy with lysis of adhesions and manipulation under anesthesia. Discharge Medications: Standard - phenergan DVT Prophylaxis: aspirin Physical Therapy: outpatient PT - scheduled for 9/8 Special Discharge needs: +/-   Vernetta Honey, PA-C  03/02/2022 5:22 AM

## 2022-03-02 NOTE — Therapy (Signed)
OUTPATIENT PHYSICAL THERAPY TREATMENT NOTE     Patient Name: Douglas Adams MRN: [id number] DOB:August 17, 2000, 21 y.o., male Today's Date: 03/03/2022  PCP: Saddie Benders MD  REFERRING PROVIDER: Saddie Benders MD   PT End of Session - 03/03/22 0929     Visit Number 22    Number of Visits 29    Date for PT Re-Evaluation 03/28/22    Authorization Type [employer]    Authorization Time Period 01/31/22-03/28/22    Progress Note Due on Visit 30    PT Start Time 0847    PT Stop Time 0931    PT Time Calculation (min) 44 min    Equipment Utilized During Treatment Other (comment);Right knee immobilizer    Activity Tolerance Patient tolerated treatment well    Behavior During Therapy [facility] for tasks assessed/performed                     Past Medical History:  Diagnosis Date   Allergies    PONV (postoperative nausea and vomiting) 12/01/2021   pt's with extended recovery- vomiting x 3 hrs.   Past Surgical History:  Procedure Laterality Date   ADENOIDECTOMY AND MYRINGOTOMY WITH TUBE PLACEMENT Bilateral 2007   ANTERIOR CRUCIATE LIGAMENT REPAIR Right 12/01/2021   Procedure: RECONSTRUCTION ANTERIOR CRUCIATE LIGAMENT (ACL);  Surgeon: Hiram Gash, MD;  Location: [place];  Service: Orthopedics;  Laterality: Right;   KNEE ARTHROSCOPY Right 03/02/2022   Procedure: ARTHROSCOPY KNEE;  Surgeon: Hiram Gash, MD;  Location: [place];  Service: Orthopedics;  Laterality: Right;   LYSIS OF ADHESION Right 03/02/2022   Procedure: LYSIS OF ADHESION/MANIPULATION;  Surgeon: Hiram Gash, MD;  Location: [place];  Service: Orthopedics;  Laterality: Right;   There are no problems to display for this patient.   REFERRING DIAG: ACL reconstruction and arthroscopy of R knee  THERAPY DIAG:  Acute pain of right knee  Stiffness of right knee, not elsewhere classified  Difficulty in walking, not elsewhere classified  Rationale for Evaluation  and Treatment Rehabilitation  PERTINENT HISTORY: Patient is a 21 year old male s/p ACL reconstruction with meniscal repair 12/01/21. Patient was injured with a twisting injury while playing basketball where he felt a pop and was unable to bear weight. Protocol not provided however [name] has protocol from clinic: knee extension lock for one week, d/c brace by 4 weeks if quad control appropriate. Progress PROM AAROM and AROM as tolerated for 0-6 weeks; with limitation of 0-90 for first 4 weeks. Pt goes for manip under anesth on 03/02/22.   PRECAUTIONS: ACL protocol  SUBJECTIVE: Patient reports some pain discomfort following right knee manipulation yesterday.  Patient reports no particular precautions or instructions from Dr. Following ambulation other than to keep right knee Ace wrapped for the next 3 days.  PAIN:  Are you having pain? Yes: NPRS scale: 6-7/10 Pain location: Rt Patella, quad   Pain description: post surgical Aggravating factors: movement Relieving factors: elevating, ice, rest  INTERVENTION therEx:   Bicycle: forward  and backward x 5 min   Quad sets x6 reps x5-second holds  Heel slides with quad mobilizations with movement 10 times with 5-second holds at endrange -92 degrees flexion at end range with PT assist for end range   Manual:   Quad stretch  supine edge of mat, 2 x 45 seconds  Trigger point mobilization ( ecentric and concentric quad contractions AAROM from knee slightly flexed to extnesion with targeted mobilizaiotn  of trigger points on distal lateral and medial aspects of quad.  Passive range of motion of right knee with 10-second holds at end range x5 minutes.         PATIENT EDUCATION: Education details: exercise technique, body mechanics, protocol Person educated: Patient Education method: Explanation, Demonstration, Tactile cues, and Verbal cues Education comprehension: verbalized understanding, returned demonstration, verbal cues required, and  tactile cues required   HOME EXERCISE PROGRAM: Access Code: [email or web address] URL: [email or web address] Date: 12/15/2021 Prepared by: [name]  Exercises - Long Sitting Quad Set  - 1 x daily - 7 x weekly - 2 sets - 10 reps - 5 hold - Active Straight Leg Raise with Quad Set  - 1 x daily - 7 x weekly - 2 sets - 10 reps - 5 hold - Mini Squat with Counter Support  - 1 x daily - 7 x weekly - 2 sets - 10 reps - 5 hold - Side Lunge with Counter Support  - 1 x daily - 7 x weekly - 2 sets - 10 reps - 5 hold - Seated Ankle Alphabet  - 1 x daily - 7 x weekly - 2 sets - 10 reps - 5 hold   PT Short Term Goals       PT SHORT TERM GOAL #1   Title Patient will be independent in home exercise program to improve strength/mobility for better functional independence with ADLs.    Baseline 6/12; HEP given 7/20 : HEP compliant    Time 4    Status Achieved   Target Date 01/02/22      PT SHORT TERM GOAL #2   Title Patient will report a worst pain of 5/10 on VAS in R knee to improve tolerance with ADLs and reduced symptoms with activities.    Baseline 6/12: 8/10 7/20: 6/10 8/8: 5/10    Time 4    Period Weeks    Status MET   Target Date 01/02/22      PT SHORT TERM GOAL #3   Title Patient will increase 10 meter walk test to >1.38ms with LRAD as to improve gait speed for better community ambulation and to reduce fall risk.    Baseline 6/12:0.77 with crutches 7/20: 1.6 m/s    Time 4    Period Weeks    Status Achieved   Target Date 01/02/22      PT SHORT TERM GOAL #4   Title --    Baseline --    Time --    Period --    Status --    Target Date --      PT SHORT TERM GOAL #5   Title --    Baseline --    Time --    Period --    Status --    Target Date --              PT Long Term Goals      PT LONG TERM GOAL #1   Title Patient will increase FOTO score to equal to or greater than  71   to demonstrate statistically significant improvement in mobility and quality of life.     Baseline 6/12: 30 7/20: 51 8/8: 65% 8/31: 64%   Time 8    Period Weeks    Status Partially Met   Target Date 03/28/2022       PT LONG TERM GOAL #2   Title Patient will report a worst pain of 3/10 on VAS in  R  knee  to improve tolerance with ADLs and reduced symptoms with activities.    Baseline 6/12: 8/10 pain 7/20: 6/10 8/8: 5/10 8/31: 6/10    Time 8    Period Weeks    Status Partially Met   Target Date 03/28/2022       PT LONG TERM GOAL #3   Title Patient will increase lower extremity functional scale to >60/80 to demonstrate improved functional mobility and increased tolerance with ADLs.    Baseline 6/12: 19/80 7/20: 45/80 8/8: 49/80 8/31 55/80   Time 8    Period Weeks    Status Partially Met   Target Date 03/28/2022       PT LONG TERM GOAL #4   Title Patient will improve R knee ROM (neutral to 120 degrees) for return to PLOF and safe functional mobility.    Baseline 6/12: locked in extension 7/20: seated 85 supine AROM: 75    extension: AROM -5 PROM -2 8/8:  supine AROM 75 PROM 80;   seated: AROM 73 PROM 86 8/31: supine:AROM 75 PROM 84   sitting; AROM 79 PROM 92    Time 8    Period Weeks    Status Partially Met   Target Date 03/28/2022       PT LONG TERM GOAL #5   Title Patient will increase six minute walk test distance to >1000 for progression to community ambulator and improve gait ability    Baseline 6/12: unable to test 7/20: 1340 w/o brace 8/8: 1690 ft    Time 8    Period Weeks    Status MET   Target Date 03/28/2022     PT LONG TERM GOAL #6  Title Patient will return to sport and running 3 miles without pain increase for return to PLOF   Baseline 8/8: unable to play or run 8/31: unable to   Time 8   Period Weeks   Status On going  Target Date 03/28/2022                Plan       Clinical Impression Statement Patient presents with excellent motivation for completion of physical therapy activities.  All of activities were focused on improvement  in right knee range of motion particularly with flexion.  Patient able to achieve 92 degrees of flexion patient reports decreased pain with motion at this point compared to prior to his recent right knee manipulation.  Patient structured to continue to ice and elevate over the weekend along with multiple times a day of quad sets, straight leg raises, and knee slides/heel slides with holds at end range from 5 to 30 seconds depending on level of pain and discomfort.  Patient will continue to benefit from skilled physical therapy interventions in order to improve right knee range of motion, strength, in order to return allow return to his previous level of function.   Personal Factors and Comorbidities Profession;Transportation    Examination-Activity Limitations Bathing;Bed Mobility;Bend;Caring for Others;Carry;Dressing;Hygiene/Grooming;Lift;Locomotion Level;Toileting;Stand;Stairs;Squat;Sit;Transfers    Examination-Participation Restrictions Cleaning;Community Activity;Driving;Meal Prep;Laundry;Occupation;Shop;Yard Work;Volunteer    Stability/Clinical Decision Making Stable/Uncomplicated    Rehab Potential Good    PT Frequency 2x / week    PT Duration 8 weeks    PT Treatment/Interventions ADLs/Self Care Home Management;Cryotherapy;Electrical Stimulation;Iontophoresis 44m/ml Dexamethasone;Moist Heat;Gait training;DME Instruction;Stair training;Functional mobility training;Therapeutic activities;Therapeutic exercise;Patient/family education;Neuromuscular re-education;Balance training;Orthotic Fit/Training;Manual techniques;Passive range of motion;Scar mobilization;Manual lymph drainage;Dry needling;Energy conservation;Splinting;Taping    PT Next Visit Plan    PT Home Exercise Plan see above  Consulted and Agree with Plan of Care Patient            Particia Lather PT    03/03/2022, 11:07 AM   11:07 AM, 03/03/22

## 2022-03-02 NOTE — Op Note (Signed)
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  ACL was completely intact and no signs of obvious impingement.  We did elevate the notch just to confirm that he would not impinge in full extension.  There was abundant anterior compartment scarring as well as suprapatellar scarring.  We performed a lysis and synovectomy of these areas.  The medial lateral compartments as well as the patellofemoral compartment all look normal.  Successful completion of the planned procedure.    Post-operative plan: The patient will be weightbearing to tolerance with early therapy.  The patient will be discharged home.  DVT prophylaxis Aspirin 81 mg twice daily for 6 weeks.  Pain control with PRN pain medication preferring oral medicines.  Follow up plan will be scheduled in approximately 7 days for incision check.  Post-Op Diagnosis: Same Surgeons:Primary: Bjorn Pippin, MD Assistants:Caroline McBane PA-C Location: [facility] OR ROOM 1 Anesthesia: General with local Antibiotics: Ancef 2 g Tourniquet time:  Total Tourniquet Time Documented: Thigh (Right) - 25 minutes Total: Thigh (Right) - 25 minutes  Estimated Blood Loss: Minimal Complications: None Specimens: None Implants: * No implants in log *  Indications for Surgery:   Douglas Adams is a 21 y.o. male with arthrofibrosis after previous BTB ACL reconstruction.  Patient failed nonoperative measures.  Benefits and risks of operative and nonoperative management were discussed prior to surgery with patient/guardian(s) and informed consent form was completed.  Specific risks including infection, need for  additional surgery, rupture of surrounding structures, recurrent stiffness amongst others.   Procedure:   The patient was identified properly. Informed consent was obtained and the surgical site was marked. The patient was taken up to suite where general anesthesia was induced. The patient was placed in the supine position with a post against the surgical leg and a nonsterile tourniquet applied. The surgical leg was then prepped and draped usual sterile fashion.  A standard surgical timeout was performed.  2 standard anterior portals were made and diagnostic arthroscopy performed. Please note the findings as noted above.  We noted the preoperative motion.  The patient had such stiffness that we went into partial flexion and made our portals.  We used a shaver as well as an RF ablator to carefully and safely resect the anterior interval scarring taking care not to go near the patellar tendon itself.  We released and preserve the meniscus attachments anteriorly.  The ACL had some mild scarring around it but otherwise appeared to be quite normal.  We resected synovium from the anterior medial anterior lateral suprapatellar and posterior compartments.  The suprapatellar pouch was quite scarred down and we released the tissue with an RF ablator and ensured that hemostasis was appropriate.  We were careful not to get near the lateral geniculate.  At that point we cleared the joint and performed a manipulation achieving near full extension and flexion to 135 140 degrees.  We placed the instruments back and the need to confirm that her ACL was intact and no other structures were damaged.  Everything appeared to be normal.  We did use our bur to perform a mild notchplasty superiorly to ensure that he would not impinge in full extension though there was no  obvious sign of impingement.  Local anesthetic was infiltrated.  Incisions closed with absorbable suture. The patient was awoken from general anesthesia and taken  to the PACU in stable condition without complication.   Alfonse Alpers, PA-C, present and scrubbed throughout the case, critical for completion in a timely fashion, and for retraction, instrumentation, closure.

## 2022-03-02 NOTE — Anesthesia Preprocedure Evaluation (Signed)
Anesthesia Evaluation  Patient identified by MRN, date of birth, ID band Patient awake    Reviewed: Allergy & Precautions, NPO status , Patient's Chart, lab work & pertinent test results  History of Anesthesia Complications (+) PONV and history of anesthetic complications  Airway Mallampati: I  TM Distance: >3 FB Neck ROM: Full    Dental no notable dental hx. 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   Abdominal   Peds  Hematology negative hematology ROS (+)   Anesthesia Other Findings   Reproductive/Obstetrics                             Anesthesia Physical Anesthesia Plan  ASA: 2  Anesthesia Plan: General   Post-op Pain Management: Minimal or no pain anticipated   Induction: Intravenous  PONV Risk Score and Plan: Treatment may vary due to age or medical condition, Propofol infusion and TIVA  Airway Management Planned: Mask, LMA and Natural Airway  Additional Equipment: None  Intra-op Plan:   Post-operative Plan: Extubation in OR  Informed Consent: I have reviewed the patients History and Physical, chart, labs and discussed the procedure including the risks, benefits and alternatives for the proposed anesthesia with the patient or authorized representative who has indicated his/her understanding and acceptance.     Dental advisory given  Plan Discussed with: CRNA and Anesthesiologist  Anesthesia Plan Comments:         Anesthesia Quick  Evaluation

## 2022-03-02 NOTE — Anesthesia Postprocedure Evaluation (Signed)
Anesthesia Post Note  Patient: Naval architect  Procedure(s) Performed: LYSIS OF ADHESION/MANIPULATION (Right: Knee) ARTHROSCOPY KNEE (Right: Knee)     Patient location during evaluation: PACU Anesthesia Type: General Level of consciousness: awake and alert and oriented Pain management: pain level controlled Vital Signs Assessment: post-procedure vital signs reviewed and stable Respiratory status: spontaneous breathing, nonlabored ventilation and respiratory function stable Cardiovascular status: blood pressure returned to baseline and stable Postop Assessment: no apparent nausea or vomiting Anesthetic complications: no   No notable events documented.  Last Vitals:  Vitals:   03/02/22 1000 03/02/22 1015  BP: (!) 96/56 (!) 110/94  Pulse: 61 77  Resp: 11 20  Temp:    SpO2: 100% 100%    Last Pain:  Vitals:   03/02/22 1000  TempSrc:   PainSc: Asleep                 Kailene Steinhart A.

## 2022-03-02 NOTE — Transfer of Care (Signed)
Immediate Anesthesia Transfer of Care Note  Patient: Douglas Adams  Procedure(s) Performed: LYSIS OF ADHESION/MANIPULATION (Right: Knee) ARTHROSCOPY KNEE (Right: Knee)  Patient Location: PACU  Anesthesia Type:General  Level of Consciousness: drowsy  Airway & Oxygen Therapy: Patient Spontanous Breathing and Patient connected to face mask oxygen  Post-op Assessment: Report given to RN and Post -op Vital signs reviewed and stable  Post vital signs: Reviewed and stable  Last Vitals:  Vitals Value Taken Time  BP 86/50 03/02/22 0930  Temp    Pulse 66 03/02/22 0938  Resp 13 03/02/22 0938  SpO2 98 % 03/02/22 0938  Vitals shown include unvalidated device data.  Last Pain:  Vitals:   03/02/22 0713  TempSrc: Oral  PainSc: 2          Complications: No notable events documented.

## 2022-03-02 NOTE — Interval H&P Note (Signed)
All questions answered, patient wants to proceed with procedure. ? ?

## 2022-03-02 NOTE — Discharge Instructions (Addendum)
Ramond Marrow MD, MPH Alfonse Alpers, PA-C [facility] [address], [address] [phone] (tel)   [phone] (fax)   POST-OPERATIVE INSTRUCTIONS - Knee Arthroscopy  WOUND CARE - You may remove the Operative Dressing on Post-Op Day #3 (72hrs after surgery).   -  Alternatively if you would like you can leave dressing on until follow-up if within 7-8 days but keep it dry. - Leave steri-strips in place until they fall off on their own, usually 2 weeks postop. - An ACE wrap may be used to control swelling, do not wrap this too tight.  If the initial ACE wrap feels too tight you may loosen it. - There may be a small amount of fluid/bleeding leaking at the surgical site.  - This is normal; the knee is filled with fluid during the procedure and can leak for 24-48hrs after surgery. You may change/reinforce the bandage as needed.  - Use the Cryocuff or Ice as often as possible for the first 7 days, then as needed for pain relief. Always keep a towel, ACE wrap or other barrier between the cooling unit and your skin.  - You may shower on Post-Op Day #3. Gently pat the area dry.  - Do not soak the knee in water or submerge it.  - Do not go swimming in the pool or ocean until 4 weeks after surgery or when otherwise instructed.  Keep dry incisions as dry as possible.   BRACE/AMBULATION -            You will not need a brace after this procedure.   - You may use crutches initially to help you weight bear, but this is not required - You can put full weight on your operative leg as you feel comfortable  PHYSICAL THERAPY - You will begin physical therapy soon after surgery (unless otherwise specified) - Please call to set up an appointment, if you do not already have one  - Let our office if there are any issues with scheduling your therapy   - A PT referral was sent to [facility] - Please schedule an appointment ASAP if you have not already done so    REGIONAL ANESTHESIA (NERVE BLOCKS) The anesthesia team may have performed a nerve block for you this is a great tool used to minimize pain.   The block may start wearing off overnight (between 8-24 hours postop) When the block wears off, your pain may go from nearly zero to the pain you would have had postop without the block. This is an abrupt transition but nothing dangerous is happening.   This can be a challenging period but utilize your as needed pain medications to try and manage this period. We suggest you use the pain medication the first night prior to going to bed, to ease this transition.  You may take an extra dose of narcotic when this happens if needed   POST-OP MEDICATIONS- Multimodal approach to pain control In general your pain will be controlled with a combination of substances.  Prescriptions unless otherwise discussed are electronically sent to your pharmacy.  This is a carefully made plan we use to minimize narcotic use.     Meloxicam - Anti-inflammatory medication taken on a scheduled basis Acetaminophen - Non-narcotic pain medicine taken on a scheduled basis  Oxycodone - This is a strong narcotic, to be used only on an "as needed" basis for SEVERE pain. Robaxin - this is a muscle relaxer, take as needed  for muscle spasms Aspirin 81mg  - This medicine is used to minimize the risk of blood clots after surgery. Phenergan - take as needed for nausea   FOLLOW-UP   Please call the office to schedule a follow-up appointment for your incision check, 7-10 days post-operatively.   IF YOU HAVE ANY QUESTIONS, PLEASE FEEL FREE TO CALL OUR OFFICE.   HELPFUL INFORMATION   Keep your leg elevated to decrease swelling, which will then in turn decrease your pain. I would elevate the foot of your bed by putting a couple of couch pillows between your mattress and box spring. I would not keep pillow directly under your ankle.  - Do not sleep with a pillow behind your knee even  if it is more comfortable as this may make it harder to get your knee fully straight long term.   There will be MORE swelling on days 1-3 than there is on the day of surgery.  This also is normal. The swelling will decrease with the anti-inflammatory medication, ice and keeping it elevated. The swelling will make it more difficult to bend your knee. As the swelling goes down your motion will become easier   You may develop swelling and bruising that extends from your knee down to your calf and perhaps even to your foot over the next week. Do not be alarmed. This too is normal, and it is due to gravity   There may be some numbness adjacent to the incision site. This may last for 6-12 months or longer in some patients and is expected.   You may return to sedentary work/school in the next couple of days when you feel up to it. You will need to keep your leg elevated as much as possible    You should wean off your narcotic medicines as soon as you are able.  Most patients will be off or using minimal narcotics before their first postop appointment.    We suggest you use the pain medication the first night prior to going to bed, in order to ease any pain when the anesthesia wears off. You should avoid taking pain medications on an empty stomach as it will make you nauseous.   Do not drink alcoholic beverages or take illicit drugs when taking pain medications.   It is against the law to drive while taking narcotics. You cannot drive if your Right leg is in brace locked in extension.   Pain medication may make you constipated.  Below are a few solutions to try in this order:  o Decrease the amount of pain medication if you aren't having pain.  o Drink lots of decaffeinated fluids.  o Drink prune juice and/or eat dried prunes   o If the first 3 don't work start with additional solutions  o Take Colace - an over-the-counter stool softener  o Take Senokot - an over-the-counter  laxative  o Take Miralax - a stronger over-the-counter laxative    For more information including helpful videos and documents visit our website:   [email or web address]    Post Anesthesia Home Care Instructions  Activity: Get plenty of rest for the remainder of the day. A responsible individual must stay with you for 24 hours following the procedure.  For the next 24 hours, DO NOT: -Drive a car -01-15-1992 -Drink alcoholic beverages -Take any medication unless instructed by your physician -Make any legal decisions or sign important papers.  Meals: Start with liquid foods such as gelatin or soup. Progress to regular  foods as tolerated. Avoid greasy, spicy, heavy foods. If nausea and/or vomiting occur, drink only clear liquids until the nausea and/or vomiting subsides. Call your physician if vomiting continues.  Special Instructions/Symptoms: Your throat may feel dry or sore from the anesthesia or the breathing tube placed in your throat during surgery. If this causes discomfort, gargle with warm salt water. The discomfort should disappear within 24 hours.  If you had a scopolamine patch placed behind your ear for the management of post- operative nausea and/or vomiting:  1. The medication in the patch is effective for 72 hours, after which it should be removed.  Wrap patch in a tissue and discard in the trash. Wash hands thoroughly with soap and water. 2. You may remove the patch earlier than 72 hours if you experience unpleasant side effects which may include dry mouth, dizziness or visual disturbances. 3. Avoid touching the patch. Wash your hands with soap and water after contact with the patch.

## 2022-03-03 ENCOUNTER — Encounter (HOSPITAL_BASED_OUTPATIENT_CLINIC_OR_DEPARTMENT_OTHER): Payer: Self-pay | Admitting: Orthopaedic Surgery

## 2022-03-03 ENCOUNTER — Ambulatory Visit: Payer: 59 | Admitting: Physical Therapy

## 2022-03-03 DIAGNOSIS — M25661 Stiffness of right knee, not elsewhere classified: Secondary | ICD-10-CM

## 2022-03-03 DIAGNOSIS — M25561 Pain in right knee: Secondary | ICD-10-CM

## 2022-03-03 DIAGNOSIS — R262 Difficulty in walking, not elsewhere classified: Secondary | ICD-10-CM

## 2022-03-06 ENCOUNTER — Ambulatory Visit: Payer: 59 | Admitting: Physical Therapy

## 2022-03-07 ENCOUNTER — Ambulatory Visit: Payer: 59

## 2022-03-07 DIAGNOSIS — M25661 Stiffness of right knee, not elsewhere classified: Secondary | ICD-10-CM

## 2022-03-07 DIAGNOSIS — M25561 Pain in right knee: Secondary | ICD-10-CM | POA: Diagnosis not present

## 2022-03-07 DIAGNOSIS — R262 Difficulty in walking, not elsewhere classified: Secondary | ICD-10-CM

## 2022-03-07 NOTE — Therapy (Signed)
OUTPATIENT PHYSICAL THERAPY TREATMENT NOTE     Patient Name: Douglas Adams MRN: [id number] DOB:2001/04/05, 21 y.o., male Today's Date: 03/07/2022  PCP: Saddie Benders MD  REFERRING PROVIDER: Saddie Benders MD   PT End of Session - 03/07/22 0718     Visit Number 23    Number of Visits 29    Date for PT Re-Evaluation 03/28/22    Authorization Type [employer]    Authorization Time Period 01/31/22-03/28/22    Progress Note Due on Visit 30    PT Start Time 0717    PT Stop Time 0759    PT Time Calculation (min) 42 min    Equipment Utilized During Treatment Other (comment);Right knee immobilizer    Activity Tolerance Patient tolerated treatment well    Behavior During Therapy [facility] for tasks assessed/performed                      Past Medical History:  Diagnosis Date   Allergies    PONV (postoperative nausea and vomiting) 12/01/2021   pt's with extended recovery- vomiting x 3 hrs.   Past Surgical History:  Procedure Laterality Date   ADENOIDECTOMY AND MYRINGOTOMY WITH TUBE PLACEMENT Bilateral 2007   ANTERIOR CRUCIATE LIGAMENT REPAIR Right 12/01/2021   Procedure: RECONSTRUCTION ANTERIOR CRUCIATE LIGAMENT (ACL);  Surgeon: Hiram Gash, MD;  Location: [place];  Service: Orthopedics;  Laterality: Right;   KNEE ARTHROSCOPY Right 03/02/2022   Procedure: ARTHROSCOPY KNEE;  Surgeon: Hiram Gash, MD;  Location: [place];  Service: Orthopedics;  Laterality: Right;   LYSIS OF ADHESION Right 03/02/2022   Procedure: LYSIS OF ADHESION/MANIPULATION;  Surgeon: Hiram Gash, MD;  Location: [place];  Service: Orthopedics;  Laterality: Right;   There are no problems to display for this patient.   REFERRING DIAG: ACL reconstruction and arthroscopy of R knee  THERAPY DIAG:  Acute pain of right knee  Stiffness of right knee, not elsewhere classified  Difficulty in walking, not elsewhere classified  Rationale for  Evaluation and Treatment Rehabilitation  PERTINENT HISTORY: Patient is a 21 year old male s/p ACL reconstruction with meniscal repair 12/01/21. Patient was injured with a twisting injury while playing basketball where he felt a pop and was unable to bear weight. Protocol not provided however [name] has protocol from clinic: knee extension lock for one week, d/c brace by 4 weeks if quad control appropriate. Progress PROM AAROM and AROM as tolerated for 0-6 weeks; with limitation of 0-90 for first 4 weeks. Pt goes for manip under anesth on 03/02/22.   PRECAUTIONS: ACL protocol  SUBJECTIVE:Patient reports having increased pain over the weekend, with pain increasing to 9/10. Improved after doing exercises Sunday.   PAIN:  Are you having pain? Yes: NPRS scale: 6/10 Pain location: Rt Patella, quad   Pain description: post surgical Aggravating factors: movement Relieving factors: elevating, ice, rest  INTERVENTION therEx:   Bicycle: forward  and backward x10 each direction x 2 sets; one repetition of full circle   Quad sets x6 reps x5-second holds  Heel slides with quad mobilizations with movement 10 times with 5-second holds at endrange -92 degrees flexion at end range AROM, 98 degrees end range PROM   Squat 10x with UE support   Ambulation sequencing for decreased limp.   Manual:   Passive range of motion of right knee with 10-second holds at end range x5 minutes prone; x 3 minutes supine  Trigger Point Dry Needling (  TDN), unbilled Education performed with patient regarding potential benefit of TDN. Reviewed precautions and risks with patient. Reviewed special precautions/risks over lung fields which include pneumothorax. Reviewed signs and symptoms of pneumothorax and advised pt to go to ER immediately if these symptoms develop advise them of dry needling treatment. Extensive time spent with pt to ensure full understanding of TDN risks. Pt provided verbal consent to treatment. TDN performed  to  with 0.25 x 40 single needle placements with local twitch response (LTR). Pistoning technique utilized. Improved pain-free motion following intervention. R medial quad and lateral hamstring x 2 minutes           PATIENT EDUCATION: Education details: exercise technique, body mechanics, protocol Person educated: Patient Education method: Explanation, Demonstration, Tactile cues, and Verbal cues Education comprehension: verbalized understanding, returned demonstration, verbal cues required, and tactile cues required   HOME EXERCISE PROGRAM: Access Code: [email or web address] URL: [email or web address] Date: 12/15/2021 Prepared by: [name]  Exercises - Long Sitting Quad Set  - 1 x daily - 7 x weekly - 2 sets - 10 reps - 5 hold - Active Straight Leg Raise with Quad Set  - 1 x daily - 7 x weekly - 2 sets - 10 reps - 5 hold - Mini Squat with Counter Support  - 1 x daily - 7 x weekly - 2 sets - 10 reps - 5 hold - Side Lunge with Counter Support  - 1 x daily - 7 x weekly - 2 sets - 10 reps - 5 hold - Seated Ankle Alphabet  - 1 x daily - 7 x weekly - 2 sets - 10 reps - 5 hold   PT Short Term Goals       PT SHORT TERM GOAL #1   Title Patient will be independent in home exercise program to improve strength/mobility for better functional independence with ADLs.    Baseline 6/12; HEP given 7/20 : HEP compliant    Time 4    Status Achieved   Target Date 01/02/22      PT SHORT TERM GOAL #2   Title Patient will report a worst pain of 5/10 on VAS in R knee to improve tolerance with ADLs and reduced symptoms with activities.    Baseline 6/12: 8/10 7/20: 6/10 8/8: 5/10    Time 4    Period Weeks    Status MET   Target Date 01/02/22      PT SHORT TERM GOAL #3   Title Patient will increase 10 meter walk test to >1.59ms with LRAD as to improve gait speed for better community ambulation and to reduce fall risk.    Baseline 6/12:0.77 with crutches 7/20: 1.6 m/s    Time 4    Period  Weeks    Status Achieved   Target Date 01/02/22      PT SHORT TERM GOAL #4   Title --    Baseline --    Time --    Period --    Status --    Target Date --      PT SHORT TERM GOAL #5   Title --    Baseline --    Time --    Period --    Status --    Target Date --              PT Long Term Goals      PT LONG TERM GOAL #1   Title Patient will increase FOTO score to equal to or  greater than  71   to demonstrate statistically significant improvement in mobility and quality of life.    Baseline 6/12: 30 7/20: 51 8/8: 65% 8/31: 64%   Time 8    Period Weeks    Status Partially Met   Target Date 03/28/2022       PT LONG TERM GOAL #2   Title Patient will report a worst pain of 3/10 on VAS in  R knee  to improve tolerance with ADLs and reduced symptoms with activities.    Baseline 6/12: 8/10 pain 7/20: 6/10 8/8: 5/10 8/31: 6/10    Time 8    Period Weeks    Status Partially Met   Target Date 03/28/2022       PT LONG TERM GOAL #3   Title Patient will increase lower extremity functional scale to >60/80 to demonstrate improved functional mobility and increased tolerance with ADLs.    Baseline 6/12: 19/80 7/20: 45/80 8/8: 49/80 8/31 55/80   Time 8    Period Weeks    Status Partially Met   Target Date 03/28/2022       PT LONG TERM GOAL #4   Title Patient will improve R knee ROM (neutral to 120 degrees) for return to PLOF and safe functional mobility.    Baseline 6/12: locked in extension 7/20: seated 85 supine AROM: 75    extension: AROM -5 PROM -2 8/8:  supine AROM 75 PROM 80;   seated: AROM 73 PROM 86 8/31: supine:AROM 75 PROM 84   sitting; AROM 79 PROM 92    Time 8    Period Weeks    Status Partially Met   Target Date 03/28/2022       PT LONG TERM GOAL #5   Title Patient will increase six minute walk test distance to >1000 for progression to community ambulator and improve gait ability    Baseline 6/12: unable to test 7/20: 1340 w/o brace 8/8: 1690 ft    Time 8     Period Weeks    Status MET   Target Date 03/28/2022     PT LONG TERM GOAL #6  Title Patient will return to sport and running 3 miles without pain increase for return to PLOF   Baseline 8/8: unable to play or run 8/31: unable to   Time 8   Period Weeks   Status On going  Target Date 03/28/2022                Plan       Clinical Impression Statement Patient is able to perform a full circle on bike this session. Active 92 flexion PROM 98 supine position. Patient educated on need for completing ROM exercises 3x/day minimum to maintain and increase ROM.  Patient will continue to benefit from skilled physical therapy interventions in order to improve right knee range of motion, strength, in order to return allow return to his previous level of function.   Personal Factors and Comorbidities Profession;Transportation    Examination-Activity Limitations Bathing;Bed Mobility;Bend;Caring for Others;Carry;Dressing;Hygiene/Grooming;Lift;Locomotion Level;Toileting;Stand;Stairs;Squat;Sit;Transfers    Examination-Participation Restrictions Cleaning;Community Activity;Driving;Meal Prep;Laundry;Occupation;Shop;Yard Work;Volunteer    Stability/Clinical Decision Making Stable/Uncomplicated    Rehab Potential Good    PT Frequency 2x / week    PT Duration 8 weeks    PT Treatment/Interventions ADLs/Self Care Home Management;Cryotherapy;Electrical Stimulation;Iontophoresis 51m/ml Dexamethasone;Moist Heat;Gait training;DME Instruction;Stair training;Functional mobility training;Therapeutic activities;Therapeutic exercise;Patient/family education;Neuromuscular re-education;Balance training;Orthotic Fit/Training;Manual techniques;Passive range of motion;Scar mobilization;Manual lymph drainage;Dry needling;Energy conservation;Splinting;Taping    PT Next Visit Plan  PT Home Exercise Plan see above    Consulted and Agree with Plan of Care Patient            [name] PT    03/07/2022, 8:01  AM

## 2022-03-08 NOTE — Therapy (Addendum)
OUTPATIENT PHYSICAL THERAPY TREATMENT NOTE     Patient Name: Douglas Adams MRN: [id number] DOB:2000/08/25, 21 y.o., male Today's Date: 03/09/2022  PCP: Saddie Benders MD  REFERRING PROVIDER: Saddie Benders MD   PT End of Session - 03/09/22 0713     Visit Number 24    Number of Visits 29    Date for PT Re-Evaluation 03/28/22    Authorization Type [employer]    Authorization Time Period 01/31/22-03/28/22    Progress Note Due on Visit 30    PT Start Time 0715    PT Stop Time 0759    PT Time Calculation (min) 44 min    Equipment Utilized During Treatment Other (comment);Right knee immobilizer    Activity Tolerance Patient tolerated treatment well    Behavior During Therapy [facility] for tasks assessed/performed                       Past Medical History:  Diagnosis Date   Allergies    PONV (postoperative nausea and vomiting) 12/01/2021   pt's with extended recovery- vomiting x 3 hrs.   Past Surgical History:  Procedure Laterality Date   ADENOIDECTOMY AND MYRINGOTOMY WITH TUBE PLACEMENT Bilateral 2007   ANTERIOR CRUCIATE LIGAMENT REPAIR Right 12/01/2021   Procedure: RECONSTRUCTION ANTERIOR CRUCIATE LIGAMENT (ACL);  Surgeon: Hiram Gash, MD;  Location: [place];  Service: Orthopedics;  Laterality: Right;   KNEE ARTHROSCOPY Right 03/02/2022   Procedure: ARTHROSCOPY KNEE;  Surgeon: Hiram Gash, MD;  Location: [place];  Service: Orthopedics;  Laterality: Right;   LYSIS OF ADHESION Right 03/02/2022   Procedure: LYSIS OF ADHESION/MANIPULATION;  Surgeon: Hiram Gash, MD;  Location: [place];  Service: Orthopedics;  Laterality: Right;   There are no problems to display for this patient.   REFERRING DIAG: ACL reconstruction and arthroscopy of R knee  THERAPY DIAG:  Acute pain of right knee  Stiffness of right knee, not elsewhere classified  Difficulty in walking, not elsewhere classified  Rationale for  Evaluation and Treatment Rehabilitation  PERTINENT HISTORY: Patient is a 21 year old male s/p ACL reconstruction with meniscal repair 12/01/21. Patient was injured with a twisting injury while playing basketball where he felt a pop and was unable to bear weight. Protocol not provided however [name] has protocol from clinic: knee extension lock for one week, d/c brace by 4 weeks if quad control appropriate. Progress PROM AAROM and AROM as tolerated for 0-6 weeks; with limitation of 0-90 for first 4 weeks. Pt goes for manip under anesth on 03/02/22.   PRECAUTIONS: ACL protocol  SUBJECTIVE Patient reports he is feeling stiff this morning but was doing better. Sees physician tomorrow.   PAIN:  Are you having pain? Yes: NPRS scale: 6/10 Pain location: Rt Patella, quad   Pain description: post surgical Aggravating factors: movement Relieving factors: elevating, ice, rest  INTERVENTION therEx:   Bicycle: forward  and backward x10 each direction x 2 sets; three repetition of full circle   Quad sets x6 reps x5-second holds  Heel slides with quad mobilizations with movement 10 times with 5-second holds at endrange -98 degrees flexion at end range AROM, 101 degrees end range PROM   Squat 10x with UE support   Ambulation sequencing for decreased limp.  Quad set 10x; 3 second holds; x2 sets  Prone hamstring curl 10x  6" stair ; second step knee flexion 10x   Manual:   Passive range of  motion of right knee with 10-second holds at end range x5 minutes prone; x 3 minutes supine Patella mobilization x 3 minutes   Trigger Point Dry Needling (TDN), unbilled Education performed with patient regarding potential benefit of TDN. Reviewed precautions and risks with patient. Reviewed special precautions/risks over lung fields which include pneumothorax. Reviewed signs and symptoms of pneumothorax and advised pt to go to ER immediately if these symptoms develop advise them of dry needling treatment.  Extensive time spent with pt to ensure full understanding of TDN risks. Pt provided verbal consent to treatment. TDN performed to  with 0.25 x 40 single needle placements with local twitch response (LTR). Pistoning technique utilized. Improved pain-free motion following intervention. R medial quad and lateral hamstring x 2 minutes           PATIENT EDUCATION: Education details: exercise technique, body mechanics, protocol Person educated: Patient Education method: Explanation, Demonstration, Tactile cues, and Verbal cues Education comprehension: verbalized understanding, returned demonstration, verbal cues required, and tactile cues required   HOME EXERCISE PROGRAM: Access Code: [email or web address] URL: [email or web address] Date: 12/15/2021 Prepared by: [name]  Exercises - Long Sitting Quad Set  - 1 x daily - 7 x weekly - 2 sets - 10 reps - 5 hold - Active Straight Leg Raise with Quad Set  - 1 x daily - 7 x weekly - 2 sets - 10 reps - 5 hold - Mini Squat with Counter Support  - 1 x daily - 7 x weekly - 2 sets - 10 reps - 5 hold - Side Lunge with Counter Support  - 1 x daily - 7 x weekly - 2 sets - 10 reps - 5 hold - Seated Ankle Alphabet  - 1 x daily - 7 x weekly - 2 sets - 10 reps - 5 hold   PT Short Term Goals       PT SHORT TERM GOAL #1   Title Patient will be independent in home exercise program to improve strength/mobility for better functional independence with ADLs.    Baseline 6/12; HEP given 7/20 : HEP compliant    Time 4    Status Achieved   Target Date 01/02/22      PT SHORT TERM GOAL #2   Title Patient will report a worst pain of 5/10 on VAS in R knee to improve tolerance with ADLs and reduced symptoms with activities.    Baseline 6/12: 8/10 7/20: 6/10 8/8: 5/10    Time 4    Period Weeks    Status MET   Target Date 01/02/22      PT SHORT TERM GOAL #3   Title Patient will increase 10 meter walk test to >1.66ms with LRAD as to improve gait speed  for better community ambulation and to reduce fall risk.    Baseline 6/12:0.77 with crutches 7/20: 1.6 m/s    Time 4    Period Weeks    Status Achieved   Target Date 01/02/22      PT SHORT TERM GOAL #4   Title --    Baseline --    Time --    Period --    Status --    Target Date --      PT SHORT TERM GOAL #5   Title --    Baseline --    Time --    Period --    Status --    Target Date --  PT Long Term Goals      PT LONG TERM GOAL #1   Title Patient will increase FOTO score to equal to or greater than  71   to demonstrate statistically significant improvement in mobility and quality of life.    Baseline 6/12: 30 7/20: 51 8/8: 65% 8/31: 64%   Time 8    Period Weeks    Status Partially Met   Target Date 03/28/2022       PT LONG TERM GOAL #2   Title Patient will report a worst pain of 3/10 on VAS in  R knee  to improve tolerance with ADLs and reduced symptoms with activities.    Baseline 6/12: 8/10 pain 7/20: 6/10 8/8: 5/10 8/31: 6/10    Time 8    Period Weeks    Status Partially Met   Target Date 03/28/2022       PT LONG TERM GOAL #3   Title Patient will increase lower extremity functional scale to >60/80 to demonstrate improved functional mobility and increased tolerance with ADLs.    Baseline 6/12: 19/80 7/20: 45/80 8/8: 49/80 8/31 55/80   Time 8    Period Weeks    Status Partially Met   Target Date 03/28/2022       PT LONG TERM GOAL #4   Title Patient will improve R knee ROM (neutral to 120 degrees) for return to PLOF and safe functional mobility.    Baseline 6/12: locked in extension 7/20: seated 85 supine AROM: 75    extension: AROM -5 PROM -2 8/8:  supine AROM 75 PROM 80;   seated: AROM 73 PROM 86 8/31: supine:AROM 75 PROM 84   sitting; AROM 79 PROM 92    Time 8    Period Weeks    Status Partially Met   Target Date 03/28/2022       PT LONG TERM GOAL #5   Title Patient will increase six minute walk test distance to >1000 for  progression to community ambulator and improve gait ability    Baseline 6/12: unable to test 7/20: 1340 w/o brace 8/8: 1690 ft    Time 8    Period Weeks    Status MET   Target Date 03/28/2022     PT LONG TERM GOAL #6  Title Patient will return to sport and running 3 miles without pain increase for return to PLOF   Baseline 8/8: unable to play or run 8/31: unable to   Time 8   Period Weeks   Status On going  Target Date 03/28/2022                Plan       Clinical Impression Statement Patient is able to reach >100 degrees PROM this session of knee flexion indicating improved ROM s/p surgery. Additionally he is able to actively flex knee to 93 degrees and perform three full rotations on bike this session. Patient to see physician tomorrow, continued education on need for constant compliance with flexion HEP.   Patient will continue to benefit from skilled physical therapy interventions in order to improve right knee range of motion, strength, in order to return allow return to his previous level of function.   Personal Factors and Comorbidities Profession;Transportation    Examination-Activity Limitations Bathing;Bed Mobility;Bend;Caring for Others;Carry;Dressing;Hygiene/Grooming;Lift;Locomotion Level;Toileting;Stand;Stairs;Squat;Sit;Transfers    Examination-Participation Restrictions Cleaning;Community Activity;Driving;Meal Prep;Laundry;Occupation;Shop;Yard Work;Volunteer    Stability/Clinical Decision Making Stable/Uncomplicated    Rehab Potential Good    PT Frequency 2x / week  PT Duration 8 weeks    PT Treatment/Interventions ADLs/Self Care Home Management;Cryotherapy;Electrical Stimulation;Iontophoresis 48m/ml Dexamethasone;Moist Heat;Gait training;DME Instruction;Stair training;Functional mobility training;Therapeutic activities;Therapeutic exercise;Patient/family education;Neuromuscular re-education;Balance training;Orthotic Fit/Training;Manual techniques;Passive range of  motion;Scar mobilization;Manual lymph drainage;Dry needling;Energy conservation;Splinting;Taping    PT Next Visit Plan    PT Home Exercise Plan see above    Consulted and Agree with Plan of Care Patient            MJanna ArchPT    03/09/2022, 9:06 AM

## 2022-03-09 ENCOUNTER — Ambulatory Visit: Payer: 59

## 2022-03-09 DIAGNOSIS — R262 Difficulty in walking, not elsewhere classified: Secondary | ICD-10-CM

## 2022-03-09 DIAGNOSIS — M25561 Pain in right knee: Secondary | ICD-10-CM | POA: Diagnosis not present

## 2022-03-09 DIAGNOSIS — M25661 Stiffness of right knee, not elsewhere classified: Secondary | ICD-10-CM

## 2022-03-14 ENCOUNTER — Ambulatory Visit: Payer: 59

## 2022-03-14 DIAGNOSIS — M25561 Pain in right knee: Secondary | ICD-10-CM | POA: Diagnosis not present

## 2022-03-14 DIAGNOSIS — M25661 Stiffness of right knee, not elsewhere classified: Secondary | ICD-10-CM

## 2022-03-14 DIAGNOSIS — R262 Difficulty in walking, not elsewhere classified: Secondary | ICD-10-CM

## 2022-03-14 NOTE — Therapy (Signed)
OUTPATIENT PHYSICAL THERAPY TREATMENT NOTE     Patient Name: Douglas Adams MRN: [id number] DOB:October 16, 2000, 21 y.o., male Today's Date: 03/14/2022  PCP: Saddie Benders MD  REFERRING PROVIDER: Saddie Benders MD   PT End of Session - 03/14/22 0713     Visit Number 25    Number of Visits 29    Date for PT Re-Evaluation 03/28/22    Authorization Type [employer]    Authorization Time Period 01/31/22-03/28/22    Progress Note Due on Visit 30    PT Start Time 0715    PT Stop Time 0759    PT Time Calculation (min) 44 min    Equipment Utilized During Treatment Other (comment);Right knee immobilizer    Activity Tolerance Patient tolerated treatment well    Behavior During Therapy [facility] for tasks assessed/performed                        Past Medical History:  Diagnosis Date   Allergies    PONV (postoperative nausea and vomiting) 12/01/2021   pt's with extended recovery- vomiting x 3 hrs.   Past Surgical History:  Procedure Laterality Date   ADENOIDECTOMY AND MYRINGOTOMY WITH TUBE PLACEMENT Bilateral 2007   ANTERIOR CRUCIATE LIGAMENT REPAIR Right 12/01/2021   Procedure: RECONSTRUCTION ANTERIOR CRUCIATE LIGAMENT (ACL);  Surgeon: Hiram Gash, MD;  Location: [place];  Service: Orthopedics;  Laterality: Right;   KNEE ARTHROSCOPY Right 03/02/2022   Procedure: ARTHROSCOPY KNEE;  Surgeon: Hiram Gash, MD;  Location: [place];  Service: Orthopedics;  Laterality: Right;   LYSIS OF ADHESION Right 03/02/2022   Procedure: LYSIS OF ADHESION/MANIPULATION;  Surgeon: Hiram Gash, MD;  Location: [place];  Service: Orthopedics;  Laterality: Right;   There are no problems to display for this patient.   REFERRING DIAG: ACL reconstruction and arthroscopy of R knee  THERAPY DIAG:  Acute pain of right knee  Stiffness of right knee, not elsewhere classified  Difficulty in walking, not elsewhere classified  Rationale for  Evaluation and Treatment Rehabilitation  PERTINENT HISTORY: Patient is a 21 year old male s/p ACL reconstruction with meniscal repair 12/01/21. Patient was injured with a twisting injury while playing basketball where he felt a pop and was unable to bear weight. Protocol not provided however [name] has protocol from clinic: knee extension lock for one week, d/c brace by 4 weeks if quad control appropriate. Progress PROM AAROM and AROM as tolerated for 0-6 weeks; with limitation of 0-90 for first 4 weeks. Pt goes for manip under anesth on 03/02/22.   PRECAUTIONS: ACL protocol  SUBJECTIVE: Patient reports having some discomfort in back of knee but no pain yesterday at work.   PAIN:  Are you having pain? Yes: NPRS scale: 4/10 Pain location: Rt Patella, quad   Pain description: post surgical Aggravating factors: movement Relieving factors: elevating, ice, rest  INTERVENTION therEx:   Bicycle: forward  and backward x10 each direction x 2 sets; six repetition of full circle backwards, three forwards   Quad sets x6 reps x5-second holds  Heel slides with quad mobilizations with movement 10 times with 5-second holds at endrange -103 degrees flexion at end range AROM, 110 degrees end range PROM   Superset TRX squat 12x swith walking lunges 4x length of // bars Stool forward/backwards scoot 60 ft x 2 each dirction  Ambulation sequencing for decreased limp.    Manual:   Passive range of motion of right  knee with 10-second holds at end range x5 minutes prone; x 3 minutes supine Patella mobilization x 3 minutes   Trigger Point Dry Needling (TDN), unbilled Education performed with patient regarding potential benefit of TDN. Reviewed precautions and risks with patient. Reviewed special precautions/risks over lung fields which include pneumothorax. Reviewed signs and symptoms of pneumothorax and advised pt to go to ER immediately if these symptoms develop advise them of dry needling treatment.  Extensive time spent with pt to ensure full understanding of TDN risks. Pt provided verbal consent to treatment. TDN performed to  with 0.25 x 40 single needle placements with local twitch response (LTR). Pistoning technique utilized. Improved pain-free motion following intervention. R medial quad and lateral hamstring x 2 minutes           PATIENT EDUCATION: Education details: exercise technique, body mechanics, protocol Person educated: Patient Education method: Explanation, Demonstration, Tactile cues, and Verbal cues Education comprehension: verbalized understanding, returned demonstration, verbal cues required, and tactile cues required   HOME EXERCISE PROGRAM: Access Code: [email or web address] URL: [email or web address] Date: 12/15/2021 Prepared by: [name]  Exercises - Long Sitting Quad Set  - 1 x daily - 7 x weekly - 2 sets - 10 reps - 5 hold - Active Straight Leg Raise with Quad Set  - 1 x daily - 7 x weekly - 2 sets - 10 reps - 5 hold - Mini Squat with Counter Support  - 1 x daily - 7 x weekly - 2 sets - 10 reps - 5 hold - Side Lunge with Counter Support  - 1 x daily - 7 x weekly - 2 sets - 10 reps - 5 hold - Seated Ankle Alphabet  - 1 x daily - 7 x weekly - 2 sets - 10 reps - 5 hold   PT Short Term Goals       PT SHORT TERM GOAL #1   Title Patient will be independent in home exercise program to improve strength/mobility for better functional independence with ADLs.    Baseline 6/12; HEP given 7/20 : HEP compliant    Time 4    Status Achieved   Target Date 01/02/22      PT SHORT TERM GOAL #2   Title Patient will report a worst pain of 5/10 on VAS in R knee to improve tolerance with ADLs and reduced symptoms with activities.    Baseline 6/12: 8/10 7/20: 6/10 8/8: 5/10    Time 4    Period Weeks    Status MET   Target Date 01/02/22      PT SHORT TERM GOAL #3   Title Patient will increase 10 meter walk test to >1.14ms with LRAD as to improve gait speed  for better community ambulation and to reduce fall risk.    Baseline 6/12:0.77 with crutches 7/20: 1.6 m/s    Time 4    Period Weeks    Status Achieved   Target Date 01/02/22      PT SHORT TERM GOAL #4   Title --    Baseline --    Time --    Period --    Status --    Target Date --      PT SHORT TERM GOAL #5   Title --    Baseline --    Time --    Period --    Status --    Target Date --              PT  Long Term Goals      PT LONG TERM GOAL #1   Title Patient will increase FOTO score to equal to or greater than  71   to demonstrate statistically significant improvement in mobility and quality of life.    Baseline 6/12: 30 7/20: 51 8/8: 65% 8/31: 64%   Time 8    Period Weeks    Status Partially Met   Target Date 03/28/2022       PT LONG TERM GOAL #2   Title Patient will report a worst pain of 3/10 on VAS in  R knee  to improve tolerance with ADLs and reduced symptoms with activities.    Baseline 6/12: 8/10 pain 7/20: 6/10 8/8: 5/10 8/31: 6/10    Time 8    Period Weeks    Status Partially Met   Target Date 03/28/2022       PT LONG TERM GOAL #3   Title Patient will increase lower extremity functional scale to >60/80 to demonstrate improved functional mobility and increased tolerance with ADLs.    Baseline 6/12: 19/80 7/20: 45/80 8/8: 49/80 8/31 55/80   Time 8    Period Weeks    Status Partially Met   Target Date 03/28/2022       PT LONG TERM GOAL #4   Title Patient will improve R knee ROM (neutral to 120 degrees) for return to PLOF and safe functional mobility.    Baseline 6/12: locked in extension 7/20: seated 85 supine AROM: 75    extension: AROM -5 PROM -2 8/8:  supine AROM 75 PROM 80;   seated: AROM 73 PROM 86 8/31: supine:AROM 75 PROM 84   sitting; AROM 79 PROM 92    Time 8    Period Weeks    Status Partially Met   Target Date 03/28/2022       PT LONG TERM GOAL #5   Title Patient will increase six minute walk test distance to >1000 for  progression to community ambulator and improve gait ability    Baseline 6/12: unable to test 7/20: 1340 w/o brace 8/8: 1690 ft    Time 8    Period Weeks    Status MET   Target Date 03/28/2022     PT LONG TERM GOAL #6  Title Patient will return to sport and running 3 miles without pain increase for return to PLOF   Baseline 8/8: unable to play or run 8/31: unable to   Time 8   Period Weeks   Status On going  Target Date 03/28/2022                Plan       Clinical Impression Statement Patient has significant ROM improvements with active and passive ROM. Patient is highly motivated for progression of strengthening and ROM. He is challenged with maintaining quad activation which will continue to be an area of improvement.   Patient will continue to benefit from skilled physical therapy interventions in order to improve right knee range of motion, strength, in order to return allow return to his previous level of function.   Personal Factors and Comorbidities Profession;Transportation    Examination-Activity Limitations Bathing;Bed Mobility;Bend;Caring for Others;Carry;Dressing;Hygiene/Grooming;Lift;Locomotion Level;Toileting;Stand;Stairs;Squat;Sit;Transfers    Examination-Participation Restrictions Cleaning;Community Activity;Driving;Meal Prep;Laundry;Occupation;Shop;Yard Work;Volunteer    Stability/Clinical Decision Making Stable/Uncomplicated    Rehab Potential Good    PT Frequency 2x / week    PT Duration 8 weeks    PT Treatment/Interventions ADLs/Self Care Home Management;Cryotherapy;Electrical Stimulation;Iontophoresis 1m/ml Dexamethasone;Moist Heat;Gait  training;DME Instruction;Stair training;Functional mobility training;Therapeutic activities;Therapeutic exercise;Patient/family education;Neuromuscular re-education;Balance training;Orthotic Fit/Training;Manual techniques;Passive range of motion;Scar mobilization;Manual lymph drainage;Dry needling;Energy  conservation;Splinting;Taping    PT Next Visit Plan    PT Home Exercise Plan see above    Consulted and Agree with Plan of Care Patient            [name] PT    03/14/2022, 8:02 AM

## 2022-03-15 NOTE — Therapy (Signed)
OUTPATIENT PHYSICAL THERAPY TREATMENT NOTE     Patient Name: Douglas Adams MRN: [id number] DOB:2000/11/03, 21 y.o., male Today's Date: 03/16/2022  PCP: Saddie Benders MD  REFERRING PROVIDER: Saddie Benders MD   PT End of Session - 03/16/22 0715     Visit Number 26    Number of Visits 29    Date for PT Re-Evaluation 03/28/22    Authorization Type [employer]    Authorization Time Period 01/31/22-03/28/22    Progress Note Due on Visit 30    PT Start Time 0715    PT Stop Time 0759    PT Time Calculation (min) 44 min    Equipment Utilized During Treatment Other (comment);Right knee immobilizer    Activity Tolerance Patient tolerated treatment well    Behavior During Therapy [facility] for tasks assessed/performed                         Past Medical History:  Diagnosis Date   Allergies    PONV (postoperative nausea and vomiting) 12/01/2021   pt's with extended recovery- vomiting x 3 hrs.   Past Surgical History:  Procedure Laterality Date   ADENOIDECTOMY AND MYRINGOTOMY WITH TUBE PLACEMENT Bilateral 2007   ANTERIOR CRUCIATE LIGAMENT REPAIR Right 12/01/2021   Procedure: RECONSTRUCTION ANTERIOR CRUCIATE LIGAMENT (ACL);  Surgeon: Hiram Gash, MD;  Location: [place];  Service: Orthopedics;  Laterality: Right;   KNEE ARTHROSCOPY Right 03/02/2022   Procedure: ARTHROSCOPY KNEE;  Surgeon: Hiram Gash, MD;  Location: [place];  Service: Orthopedics;  Laterality: Right;   LYSIS OF ADHESION Right 03/02/2022   Procedure: LYSIS OF ADHESION/MANIPULATION;  Surgeon: Hiram Gash, MD;  Location: [place];  Service: Orthopedics;  Laterality: Right;   There are no problems to display for this patient.   REFERRING DIAG: ACL reconstruction and arthroscopy of R knee  THERAPY DIAG:  Acute pain of right knee  Stiffness of right knee, not elsewhere classified  Difficulty in walking, not elsewhere classified  Rationale for  Evaluation and Treatment Rehabilitation  PERTINENT HISTORY: Patient is a 21 year old male s/p ACL reconstruction with meniscal repair 12/01/21. Patient was injured with a twisting injury while playing basketball where he felt a pop and was unable to bear weight. Protocol not provided however [name] has protocol from clinic: knee extension lock for one week, d/c brace by 4 weeks if quad control appropriate. Progress PROM AAROM and AROM as tolerated for 0-6 weeks; with limitation of 0-90 for first 4 weeks. Pt goes for manip under anesth on 03/02/22.   PRECAUTIONS: ACL protocol  SUBJECTIVE: Patient reports he gets occasional clicking/pulling at top of patella   PAIN:  Are you having pain? Yes: NPRS scale: 2/10 Pain location: Rt Patella, quad   Pain description: post surgical Aggravating factors: movement Relieving factors: elevating, ice, rest  INTERVENTION therEx:   Bicycle: forward  and backward x10 each direction x 2 sets; full rotations backwards and forwards second set  Quad sets x10 reps x5-second holds  Heel slides with quad mobilizations with movement 10 times with 5-second holds at endrange -108 degrees flexion at end range AROM, 116 degrees end range PROM   Stool forward/backwards scoot 60 ft x 2 each dirction  KB squat 10x; 2 sets   Manual:   Passive range of motion of right knee with 10-second holds at end range x5 minutes prone; x 5 minutes supine  Patella mobilization x 4  minutes   Trigger Point Dry Needling (TDN), unbilled Education performed with patient regarding potential benefit of TDN. Reviewed precautions and risks with patient. Reviewed special precautions/risks over lung fields which include pneumothorax. Reviewed signs and symptoms of pneumothorax and advised pt to go to ER immediately if these symptoms develop advise them of dry needling treatment. Extensive time spent with pt to ensure full understanding of TDN risks. Pt provided verbal consent to treatment. TDN  performed to  with 0.25 x 40 single needle placements with local twitch response (LTR). Pistoning technique utilized. Improved pain-free motion following intervention. R medial quad and lT band x 2 minutes           PATIENT EDUCATION: Education details: exercise technique, body mechanics, protocol Person educated: Patient Education method: Explanation, Demonstration, Tactile cues, and Verbal cues Education comprehension: verbalized understanding, returned demonstration, verbal cues required, and tactile cues required   HOME EXERCISE PROGRAM: Access Code: [email or web address] URL: [email or web address] Date: 12/15/2021 Prepared by: [name]  Exercises - Long Sitting Quad Set  - 1 x daily - 7 x weekly - 2 sets - 10 reps - 5 hold - Active Straight Leg Raise with Quad Set  - 1 x daily - 7 x weekly - 2 sets - 10 reps - 5 hold - Mini Squat with Counter Support  - 1 x daily - 7 x weekly - 2 sets - 10 reps - 5 hold - Side Lunge with Counter Support  - 1 x daily - 7 x weekly - 2 sets - 10 reps - 5 hold - Seated Ankle Alphabet  - 1 x daily - 7 x weekly - 2 sets - 10 reps - 5 hold   PT Short Term Goals       PT SHORT TERM GOAL #1   Title Patient will be independent in home exercise program to improve strength/mobility for better functional independence with ADLs.    Baseline 6/12; HEP given 7/20 : HEP compliant    Time 4    Status Achieved   Target Date 01/02/22      PT SHORT TERM GOAL #2   Title Patient will report a worst pain of 5/10 on VAS in R knee to improve tolerance with ADLs and reduced symptoms with activities.    Baseline 6/12: 8/10 7/20: 6/10 8/8: 5/10    Time 4    Period Weeks    Status MET   Target Date 01/02/22      PT SHORT TERM GOAL #3   Title Patient will increase 10 meter walk test to >1.29ms with LRAD as to improve gait speed for better community ambulation and to reduce fall risk.    Baseline 6/12:0.77 with crutches 7/20: 1.6 m/s    Time 4    Period  Weeks    Status Achieved   Target Date 01/02/22      PT SHORT TERM GOAL #4   Title --    Baseline --    Time --    Period --    Status --    Target Date --      PT SHORT TERM GOAL #5   Title --    Baseline --    Time --    Period --    Status --    Target Date --              PT Long Term Goals      PT LONG TERM GOAL #1   Title Patient will  increase FOTO score to equal to or greater than  71   to demonstrate statistically significant improvement in mobility and quality of life.    Baseline 6/12: 30 7/20: 51 8/8: 65% 8/31: 64%   Time 8    Period Weeks    Status Partially Met   Target Date 03/28/2022       PT LONG TERM GOAL #2   Title Patient will report a worst pain of 3/10 on VAS in  R knee  to improve tolerance with ADLs and reduced symptoms with activities.    Baseline 6/12: 8/10 pain 7/20: 6/10 8/8: 5/10 8/31: 6/10    Time 8    Period Weeks    Status Partially Met   Target Date 03/28/2022       PT LONG TERM GOAL #3   Title Patient will increase lower extremity functional scale to >60/80 to demonstrate improved functional mobility and increased tolerance with ADLs.    Baseline 6/12: 19/80 7/20: 45/80 8/8: 49/80 8/31 55/80   Time 8    Period Weeks    Status Partially Met   Target Date 03/28/2022       PT LONG TERM GOAL #4   Title Patient will improve R knee ROM (neutral to 120 degrees) for return to PLOF and safe functional mobility.    Baseline 6/12: locked in extension 7/20: seated 85 supine AROM: 75    extension: AROM -5 PROM -2 8/8:  supine AROM 75 PROM 80;   seated: AROM 73 PROM 86 8/31: supine:AROM 75 PROM 84   sitting; AROM 79 PROM 92    Time 8    Period Weeks    Status Partially Met   Target Date 03/28/2022       PT LONG TERM GOAL #5   Title Patient will increase six minute walk test distance to >1000 for progression to community ambulator and improve gait ability    Baseline 6/12: unable to test 7/20: 1340 w/o brace 8/8: 1690 ft    Time 8     Period Weeks    Status MET   Target Date 03/28/2022     PT LONG TERM GOAL #6  Title Patient will return to sport and running 3 miles without pain increase for return to PLOF   Baseline 8/8: unable to play or run 8/31: unable to   Time 8   Period Weeks   Status On going  Target Date 03/28/2022                Plan       Clinical Impression Statement Patient is nearing full ROM with passive ROM and active ROM. Progressive mobility in combination with strengthening tolerated well. Education on knee anatomy performed with patient verbalizing understanding. Patient is highly motivated to return to full function. Patient will continue to benefit from skilled physical therapy interventions in order to improve right knee range of motion, strength, in order to return allow return to his previous level of function.   Personal Factors and Comorbidities Profession;Transportation    Examination-Activity Limitations Bathing;Bed Mobility;Bend;Caring for Others;Carry;Dressing;Hygiene/Grooming;Lift;Locomotion Level;Toileting;Stand;Stairs;Squat;Sit;Transfers    Examination-Participation Restrictions Cleaning;Community Activity;Driving;Meal Prep;Laundry;Occupation;Shop;Yard Work;Volunteer    Stability/Clinical Decision Making Stable/Uncomplicated    Rehab Potential Good    PT Frequency 2x / week    PT Duration 8 weeks    PT Treatment/Interventions ADLs/Self Care Home Management;Cryotherapy;Electrical Stimulation;Iontophoresis 41m/ml Dexamethasone;Moist Heat;Gait training;DME Instruction;Stair training;Functional mobility training;Therapeutic activities;Therapeutic exercise;Patient/family education;Neuromuscular re-education;Balance training;Orthotic Fit/Training;Manual techniques;Passive range of motion;Scar mobilization;Manual lymph drainage;Dry needling;Energy  conservation;Splinting;Taping    PT Next Visit Plan    PT Home Exercise Plan see above    Consulted and Agree with Plan of Care Patient             [name] PT    03/16/2022, 8:02 AM

## 2022-03-16 ENCOUNTER — Ambulatory Visit: Payer: 59

## 2022-03-16 DIAGNOSIS — R262 Difficulty in walking, not elsewhere classified: Secondary | ICD-10-CM

## 2022-03-16 DIAGNOSIS — M25561 Pain in right knee: Secondary | ICD-10-CM | POA: Diagnosis not present

## 2022-03-16 DIAGNOSIS — M25661 Stiffness of right knee, not elsewhere classified: Secondary | ICD-10-CM

## 2022-03-20 NOTE — Therapy (Incomplete)
OUTPATIENT PHYSICAL THERAPY TREATMENT NOTE     Patient Name: Douglas Adams MRN: [id number] DOB:10/28/2000, 21 y.o., male Today's Date: 03/21/2022  PCP: Saddie Benders MD  REFERRING PROVIDER: Saddie Benders MD   PT End of Session - 03/21/22 0716     Visit Number 27    Number of Visits 29    Date for PT Re-Evaluation 03/28/22    Authorization Type [employer]    Authorization Time Period 01/31/22-03/28/22    Progress Note Due on Visit 30    PT Start Time 0715    PT Stop Time 0759    PT Time Calculation (min) 44 min    Equipment Utilized During Treatment Other (comment);Right knee immobilizer    Activity Tolerance Patient tolerated treatment well    Behavior During Therapy [facility] for tasks assessed/performed                          Past Medical History:  Diagnosis Date   Allergies    PONV (postoperative nausea and vomiting) 12/01/2021   pt's with extended recovery- vomiting x 3 hrs.   Past Surgical History:  Procedure Laterality Date   ADENOIDECTOMY AND MYRINGOTOMY WITH TUBE PLACEMENT Bilateral 2007   ANTERIOR CRUCIATE LIGAMENT REPAIR Right 12/01/2021   Procedure: RECONSTRUCTION ANTERIOR CRUCIATE LIGAMENT (ACL);  Surgeon: Hiram Gash, MD;  Location: [place];  Service: Orthopedics;  Laterality: Right;   KNEE ARTHROSCOPY Right 03/02/2022   Procedure: ARTHROSCOPY KNEE;  Surgeon: Hiram Gash, MD;  Location: [place];  Service: Orthopedics;  Laterality: Right;   LYSIS OF ADHESION Right 03/02/2022   Procedure: LYSIS OF ADHESION/MANIPULATION;  Surgeon: Hiram Gash, MD;  Location: [place];  Service: Orthopedics;  Laterality: Right;   There are no problems to display for this patient.   REFERRING DIAG: ACL reconstruction and arthroscopy of R knee  THERAPY DIAG:  Acute pain of right knee  Stiffness of right knee, not elsewhere classified  Difficulty in walking, not elsewhere classified  Rationale for  Evaluation and Treatment Rehabilitation  PERTINENT HISTORY: Patient is a 21 year old male s/p ACL reconstruction with meniscal repair 12/01/21. Patient was injured with a twisting injury while playing basketball where he felt a pop and was unable to bear weight. Protocol not provided however [name] has protocol from clinic: knee extension lock for one week, d/c brace by 4 weeks if quad control appropriate. Progress PROM AAROM and AROM as tolerated for 0-6 weeks; with limitation of 0-90 for first 4 weeks. Pt goes for manip under anesth on 03/02/22.   PRECAUTIONS: ACL protocol  SUBJECTIVE: Patient reports being stiff over the weekend but is moving better now.   PAIN:  Are you having pain? Yes: NPRS scale: 2/10 Pain location: Rt Patella, quad   Pain description: post surgical Aggravating factors: movement Relieving factors: elevating, ice, rest  INTERVENTION therEx:   Bicycle: forward  and backward x10 each direction x 2 sets; full rotations backwards and forwards both sets  Heel slides with quad mobilizations with movement 10 times with 5-second holds at endrange -120 degrees flexion at end range AROM, 123 degrees end range PROM   Lateral squat step 30 ft x 2 trials each direction  KB squat 10x; 2 sets 25lb Posterior lunge back leg elevated on 6" step 10x each LE Single limb bridge 10x Bridge with yoga block 10x   Manual:   Passive range of motion of right knee  with 10-second holds at end range x5 minutes prone; x 5 minutes supine  Patella mobilization x 4 minutes   Trigger Point Dry Needling (TDN), unbilled Education performed with patient regarding potential benefit of TDN. Reviewed precautions and risks with patient. Reviewed special precautions/risks over lung fields which include pneumothorax. Reviewed signs and symptoms of pneumothorax and advised pt to go to ER immediately if these symptoms develop advise them of dry needling treatment. Extensive time spent with pt to ensure full  understanding of TDN risks. Pt provided verbal consent to treatment. TDN performed to  with 0.25 x 40 single needle placements with local twitch response (LTR). Pistoning technique utilized. Improved pain-free motion following intervention. R medial quad and hamstring x 2 minutes           PATIENT EDUCATION: Education details: exercise technique, body mechanics, protocol Person educated: Patient Education method: Explanation, Demonstration, Tactile cues, and Verbal cues Education comprehension: verbalized understanding, returned demonstration, verbal cues required, and tactile cues required   HOME EXERCISE PROGRAM: Access Code: [email or web address] URL: [email or web address] Date: 12/15/2021 Prepared by: [name]  Exercises - Long Sitting Quad Set  - 1 x daily - 7 x weekly - 2 sets - 10 reps - 5 hold - Active Straight Leg Raise with Quad Set  - 1 x daily - 7 x weekly - 2 sets - 10 reps - 5 hold - Mini Squat with Counter Support  - 1 x daily - 7 x weekly - 2 sets - 10 reps - 5 hold - Side Lunge with Counter Support  - 1 x daily - 7 x weekly - 2 sets - 10 reps - 5 hold - Seated Ankle Alphabet  - 1 x daily - 7 x weekly - 2 sets - 10 reps - 5 hold   PT Short Term Goals       PT SHORT TERM GOAL #1   Title Patient will be independent in home exercise program to improve strength/mobility for better functional independence with ADLs.    Baseline 6/12; HEP given 7/20 : HEP compliant    Time 4    Status Achieved   Target Date 01/02/22      PT SHORT TERM GOAL #2   Title Patient will report a worst pain of 5/10 on VAS in R knee to improve tolerance with ADLs and reduced symptoms with activities.    Baseline 6/12: 8/10 7/20: 6/10 8/8: 5/10    Time 4    Period Weeks    Status MET   Target Date 01/02/22      PT SHORT TERM GOAL #3   Title Patient will increase 10 meter walk test to >1.86ms with LRAD as to improve gait speed for better community ambulation and to reduce fall  risk.    Baseline 6/12:0.77 with crutches 7/20: 1.6 m/s    Time 4    Period Weeks    Status Achieved   Target Date 01/02/22      PT SHORT TERM GOAL #4   Title --    Baseline --    Time --    Period --    Status --    Target Date --      PT SHORT TERM GOAL #5   Title --    Baseline --    Time --    Period --    Status --    Target Date --              PT Long  Term Goals      PT LONG TERM GOAL #1   Title Patient will increase FOTO score to equal to or greater than  71   to demonstrate statistically significant improvement in mobility and quality of life.    Baseline 6/12: 30 7/20: 51 8/8: 65% 8/31: 64%   Time 8    Period Weeks    Status Partially Met   Target Date 03/28/2022       PT LONG TERM GOAL #2   Title Patient will report a worst pain of 3/10 on VAS in  R knee  to improve tolerance with ADLs and reduced symptoms with activities.    Baseline 6/12: 8/10 pain 7/20: 6/10 8/8: 5/10 8/31: 6/10    Time 8    Period Weeks    Status Partially Met   Target Date 03/28/2022       PT LONG TERM GOAL #3   Title Patient will increase lower extremity functional scale to >60/80 to demonstrate improved functional mobility and increased tolerance with ADLs.    Baseline 6/12: 19/80 7/20: 45/80 8/8: 49/80 8/31 55/80   Time 8    Period Weeks    Status Partially Met   Target Date 03/28/2022       PT LONG TERM GOAL #4   Title Patient will improve R knee ROM (neutral to 120 degrees) for return to PLOF and safe functional mobility.    Baseline 6/12: locked in extension 7/20: seated 85 supine AROM: 75    extension: AROM -5 PROM -2 8/8:  supine AROM 75 PROM 80;   seated: AROM 73 PROM 86 8/31: supine:AROM 75 PROM 84   sitting; AROM 79 PROM 92    Time 8    Period Weeks    Status Partially Met   Target Date 03/28/2022       PT LONG TERM GOAL #5   Title Patient will increase six minute walk test distance to >1000 for progression to community ambulator and improve gait ability     Baseline 6/12: unable to test 7/20: 1340 w/o brace 8/8: 1690 ft    Time 8    Period Weeks    Status MET   Target Date 03/28/2022     PT LONG TERM GOAL #6  Title Patient will return to sport and running 3 miles without pain increase for return to PLOF   Baseline 8/8: unable to play or run 8/31: unable to   Time 8   Period Weeks   Status On going  Target Date 03/28/2022                Plan       Clinical Impression Statement Patient is able to obtain full 120 degrees of ROM actively in supine position this session for first time. Will begin strengthening protocol in combination with ROM now that functional range has been reached. Patient agreeable with POC.  Patient will continue to benefit from skilled physical therapy interventions in order to improve right knee range of motion, strength, in order to return allow return to his previous level of function.   Personal Factors and Comorbidities Profession;Transportation    Examination-Activity Limitations Bathing;Bed Mobility;Bend;Caring for Others;Carry;Dressing;Hygiene/Grooming;Lift;Locomotion Level;Toileting;Stand;Stairs;Squat;Sit;Transfers    Examination-Participation Restrictions Cleaning;Community Activity;Driving;Meal Prep;Laundry;Occupation;Shop;Yard Work;Volunteer    Stability/Clinical Decision Making Stable/Uncomplicated    Rehab Potential Good    PT Frequency 2x / week    PT Duration 8 weeks    PT Treatment/Interventions ADLs/Self Care Home Management;Cryotherapy;Electrical Stimulation;Iontophoresis 77m/ml Dexamethasone;Moist Heat;Gait  training;DME Instruction;Stair training;Functional mobility training;Therapeutic activities;Therapeutic exercise;Patient/family education;Neuromuscular re-education;Balance training;Orthotic Fit/Training;Manual techniques;Passive range of motion;Scar mobilization;Manual lymph drainage;Dry needling;Energy conservation;Splinting;Taping    PT Next Visit Plan    PT Home Exercise Plan see above     Consulted and Agree with Plan of Care Patient            [name] PT    03/21/2022, 8:00 AM

## 2022-03-21 ENCOUNTER — Ambulatory Visit: Payer: 59

## 2022-03-21 DIAGNOSIS — M25661 Stiffness of right knee, not elsewhere classified: Secondary | ICD-10-CM

## 2022-03-21 DIAGNOSIS — R262 Difficulty in walking, not elsewhere classified: Secondary | ICD-10-CM

## 2022-03-21 DIAGNOSIS — M25561 Pain in right knee: Secondary | ICD-10-CM | POA: Diagnosis not present

## 2022-03-23 ENCOUNTER — Ambulatory Visit: Payer: 59

## 2022-03-27 NOTE — Therapy (Signed)
OUTPATIENT PHYSICAL THERAPY TREATMENT NOTE     Patient Name: Douglas Adams MRN: [id number] DOB:April 29, 2001, 21 y.o., male Today's Date: 03/28/2022  PCP: Saddie Benders MD  REFERRING PROVIDER: Saddie Benders MD   PT End of Session - 03/28/22 0714     Visit Number 28    Number of Visits 29    Date for PT Re-Evaluation 03/28/22    Authorization Type [employer]    Authorization Time Period 01/31/22-03/28/22    Progress Note Due on Visit 30    PT Start Time 0715    PT Stop Time 0759    PT Time Calculation (min) 44 min    Equipment Utilized During Treatment Other (comment);Right knee immobilizer    Activity Tolerance Patient tolerated treatment well    Behavior During Therapy [facility] for tasks assessed/performed                           Past Medical History:  Diagnosis Date   Allergies    PONV (postoperative nausea and vomiting) 12/01/2021   pt's with extended recovery- vomiting x 3 hrs.   Past Surgical History:  Procedure Laterality Date   ADENOIDECTOMY AND MYRINGOTOMY WITH TUBE PLACEMENT Bilateral 2007   ANTERIOR CRUCIATE LIGAMENT REPAIR Right 12/01/2021   Procedure: RECONSTRUCTION ANTERIOR CRUCIATE LIGAMENT (ACL);  Surgeon: Hiram Gash, MD;  Location: [place];  Service: Orthopedics;  Laterality: Right;   KNEE ARTHROSCOPY Right 03/02/2022   Procedure: ARTHROSCOPY KNEE;  Surgeon: Hiram Gash, MD;  Location: [place];  Service: Orthopedics;  Laterality: Right;   LYSIS OF ADHESION Right 03/02/2022   Procedure: LYSIS OF ADHESION/MANIPULATION;  Surgeon: Hiram Gash, MD;  Location: [place];  Service: Orthopedics;  Laterality: Right;   There are no problems to display for this patient.   REFERRING DIAG: ACL reconstruction and arthroscopy of R knee  THERAPY DIAG:  Acute pain of right knee  Stiffness of right knee, not elsewhere classified  Difficulty in walking, not elsewhere classified  Rationale  for Evaluation and Treatment Rehabilitation  PERTINENT HISTORY: Patient is a 21 year old male s/p ACL reconstruction with meniscal repair 12/01/21. Patient was injured with a twisting injury while playing basketball where he felt a pop and was unable to bear weight. Protocol not provided however [name] has protocol from clinic: knee extension lock for one week, d/c brace by 4 weeks if quad control appropriate. Progress PROM AAROM and AROM as tolerated for 0-6 weeks; with limitation of 0-90 for first 4 weeks. Pt goes for manip under anesth on 03/02/22.   PRECAUTIONS: ACL protocol  SUBJECTIVE: Patient returned from [place] for [employer], was compliant with HEP. Sees surgeon Friday.   PAIN:  Are you having pain? Yes: NPRS scale: 2/10 Pain location: Rt Patella, quad   Pain description: post surgical Aggravating factors: movement Relieving factors: elevating, ice, rest  INTERVENTION therEx:   Bicycle: forward  and backward x10 each direction x 2 sets; full rotations backwards and forwards both sets  Four square squat step: lateral, forward, backwards x 4 trials  Four square: lateral squat walk, forward lunge, backwards lunge 4 trials  Heel slides with quad mobilizations with movement 10 times with 5-second holds at endrange -125 degrees flexion AROM  Single leg sit to stand 10x   Stair knee flexion/extension 10x   Manual:   Passive range of motion of right knee with 10-second holds at end range x5 minutes  prone; x 5 minutes supine  Patella mobilization x 4 minutes   Trigger Point Dry Needling (TDN), unbilled Education performed with patient regarding potential benefit of TDN. Reviewed precautions and risks with patient. Reviewed special precautions/risks over lung fields which include pneumothorax. Reviewed signs and symptoms of pneumothorax and advised pt to go to ER immediately if these symptoms develop advise them of dry needling treatment. Extensive time spent with pt to ensure full  understanding of TDN risks. Pt provided verbal consent to treatment. TDN performed to  with 0.25 x 40 single needle placements with local twitch response (LTR). Pistoning technique utilized. Improved pain-free motion following intervention. R hamstring x 2 minutes           PATIENT EDUCATION: Education details: exercise technique, body mechanics, protocol Person educated: Patient Education method: Explanation, Demonstration, Tactile cues, and Verbal cues Education comprehension: verbalized understanding, returned demonstration, verbal cues required, and tactile cues required   HOME EXERCISE PROGRAM: Access Code: [email or web address] URL: [email or web address] Date: 12/15/2021 Prepared by: [name]  Exercises - Long Sitting Quad Set  - 1 x daily - 7 x weekly - 2 sets - 10 reps - 5 hold - Active Straight Leg Raise with Quad Set  - 1 x daily - 7 x weekly - 2 sets - 10 reps - 5 hold - Mini Squat with Counter Support  - 1 x daily - 7 x weekly - 2 sets - 10 reps - 5 hold - Side Lunge with Counter Support  - 1 x daily - 7 x weekly - 2 sets - 10 reps - 5 hold - Seated Ankle Alphabet  - 1 x daily - 7 x weekly - 2 sets - 10 reps - 5 hold   PT Short Term Goals       PT SHORT TERM GOAL #1   Title Patient will be independent in home exercise program to improve strength/mobility for better functional independence with ADLs.    Baseline 6/12; HEP given 7/20 : HEP compliant    Time 4    Status Achieved   Target Date 01/02/22      PT SHORT TERM GOAL #2   Title Patient will report a worst pain of 5/10 on VAS in R knee to improve tolerance with ADLs and reduced symptoms with activities.    Baseline 6/12: 8/10 7/20: 6/10 8/8: 5/10    Time 4    Period Weeks    Status MET   Target Date 01/02/22      PT SHORT TERM GOAL #3   Title Patient will increase 10 meter walk test to >1.51ms with LRAD as to improve gait speed for better community ambulation and to reduce fall risk.    Baseline  6/12:0.77 with crutches 7/20: 1.6 m/s    Time 4    Period Weeks    Status Achieved   Target Date 01/02/22      PT SHORT TERM GOAL #4   Title --    Baseline --    Time --    Period --    Status --    Target Date --      PT SHORT TERM GOAL #5   Title --    Baseline --    Time --    Period --    Status --    Target Date --              PT Long Term Goals      PT LONG TERM GOAL #  1   Title Patient will increase FOTO score to equal to or greater than  71   to demonstrate statistically significant improvement in mobility and quality of life.    Baseline 6/12: 30 7/20: 51 8/8: 65% 8/31: 64%   Time 8    Period Weeks    Status Partially Met   Target Date 03/28/2022       PT LONG TERM GOAL #2   Title Patient will report a worst pain of 3/10 on VAS in  R knee  to improve tolerance with ADLs and reduced symptoms with activities.    Baseline 6/12: 8/10 pain 7/20: 6/10 8/8: 5/10 8/31: 6/10    Time 8    Period Weeks    Status Partially Met   Target Date 03/28/2022       PT LONG TERM GOAL #3   Title Patient will increase lower extremity functional scale to >60/80 to demonstrate improved functional mobility and increased tolerance with ADLs.    Baseline 6/12: 19/80 7/20: 45/80 8/8: 49/80 8/31 55/80   Time 8    Period Weeks    Status Partially Met   Target Date 03/28/2022       PT LONG TERM GOAL #4   Title Patient will improve R knee ROM (neutral to 120 degrees) for return to PLOF and safe functional mobility.    Baseline 6/12: locked in extension 7/20: seated 85 supine AROM: 75    extension: AROM -5 PROM -2 8/8:  supine AROM 75 PROM 80;   seated: AROM 73 PROM 86 8/31: supine:AROM 75 PROM 84   sitting; AROM 79 PROM 92    Time 8    Period Weeks    Status Partially Met   Target Date 03/28/2022       PT LONG TERM GOAL #5   Title Patient will increase six minute walk test distance to >1000 for progression to community ambulator and improve gait ability    Baseline 6/12:  unable to test 7/20: 1340 w/o brace 8/8: 1690 ft    Time 8    Period Weeks    Status MET   Target Date 03/28/2022     PT LONG TERM GOAL #6  Title Patient will return to sport and running 3 miles without pain increase for return to PLOF   Baseline 8/8: unable to play or run 8/31: unable to   Time 8   Period Weeks   Status On going  Target Date 03/28/2022                Plan       Clinical Impression Statement Patient has reached>120 degrees of active ROM of surgical knee. He does have 150 degrees active ROM of non surgical limb indicating continued area of flexion. Slow progression of strengthening integrated with look at return to activity progression tolerance.  Patient will continue to benefit from skilled physical therapy interventions in order to improve right knee range of motion, strength, in order to return allow return to his previous level of function.   Personal Factors and Comorbidities Profession;Transportation    Examination-Activity Limitations Bathing;Bed Mobility;Bend;Caring for Others;Carry;Dressing;Hygiene/Grooming;Lift;Locomotion Level;Toileting;Stand;Stairs;Squat;Sit;Transfers    Examination-Participation Restrictions Cleaning;Community Activity;Driving;Meal Prep;Laundry;Occupation;Shop;Yard Work;Volunteer    Stability/Clinical Decision Making Stable/Uncomplicated    Rehab Potential Good    PT Frequency 2x / week    PT Duration 8 weeks    PT Treatment/Interventions ADLs/Self Care Home Management;Cryotherapy;Electrical Stimulation;Iontophoresis 37m/ml Dexamethasone;Moist Heat;Gait training;DME Instruction;Stair training;Functional mobility training;Therapeutic activities;Therapeutic exercise;Patient/family education;Neuromuscular re-education;Balance training;Orthotic  Fit/Training;Manual techniques;Passive range of motion;Scar mobilization;Manual lymph drainage;Dry needling;Energy conservation;Splinting;Taping    PT Next Visit Plan    PT Home Exercise Plan see  above    Consulted and Agree with Plan of Care Patient            [name] PT    03/28/2022, 8:01 AM

## 2022-03-28 ENCOUNTER — Ambulatory Visit: Payer: 59 | Attending: Orthopaedic Surgery

## 2022-03-28 DIAGNOSIS — M25661 Stiffness of right knee, not elsewhere classified: Secondary | ICD-10-CM | POA: Diagnosis present

## 2022-03-28 DIAGNOSIS — M25561 Pain in right knee: Secondary | ICD-10-CM | POA: Insufficient documentation

## 2022-03-28 DIAGNOSIS — R262 Difficulty in walking, not elsewhere classified: Secondary | ICD-10-CM | POA: Diagnosis present

## 2022-03-29 NOTE — Therapy (Signed)
OUTPATIENT PHYSICAL THERAPY TREATMENT NOTE/RECERT     Patient Name: Douglas Adams MRN: [id number] DOB:[birth year], 21 y.o., male Today's Date: 03/30/2022  PCP: Saddie Benders MD  REFERRING PROVIDER: Saddie Benders MD   PT End of Session - 03/30/22 0716     Visit Number 29    Number of Visits 42    Date for PT Re-Evaluation 05/25/22    Authorization Type [employer]    Authorization Time Period 01/31/22-03/28/22    Progress Note Due on Visit 30    PT Start Time 0715    PT Stop Time 0759    PT Time Calculation (min) 44 min    Equipment Utilized During Treatment Other (comment);Right knee immobilizer    Activity Tolerance Patient tolerated treatment well    Behavior During Therapy [facility] for tasks assessed/performed                            Past Medical History:  Diagnosis Date   Allergies    PONV (postoperative nausea and vomiting) 12/01/2021   pt's with extended recovery- vomiting x 3 hrs.   Past Surgical History:  Procedure Laterality Date   ADENOIDECTOMY AND MYRINGOTOMY WITH TUBE PLACEMENT Bilateral 2007   ANTERIOR CRUCIATE LIGAMENT REPAIR Right 12/01/2021   Procedure: RECONSTRUCTION ANTERIOR CRUCIATE LIGAMENT (ACL);  Surgeon: Hiram Gash, MD;  Location: [place];  Service: Orthopedics;  Laterality: Right;   KNEE ARTHROSCOPY Right 03/02/2022   Procedure: ARTHROSCOPY KNEE;  Surgeon: Hiram Gash, MD;  Location: [place];  Service: Orthopedics;  Laterality: Right;   LYSIS OF ADHESION Right 03/02/2022   Procedure: LYSIS OF ADHESION/MANIPULATION;  Surgeon: Hiram Gash, MD;  Location: [place];  Service: Orthopedics;  Laterality: Right;   There are no problems to display for this patient.   REFERRING DIAG: ACL reconstruction and arthroscopy of R knee  THERAPY DIAG:  Acute pain of right knee  Stiffness of right knee, not elsewhere classified  Difficulty in walking, not elsewhere  classified  Rationale for Evaluation and Treatment Rehabilitation  PERTINENT HISTORY: Patient is a 21 year old male s/p ACL reconstruction with meniscal repair 12/01/21. Patient was injured with a twisting injury while playing basketball where he felt a pop and was unable to bear weight. Protocol not provided however [name] has protocol from clinic: knee extension lock for one week, d/c brace by 4 weeks if quad control appropriate. Progress PROM AAROM and AROM as tolerated for 0-6 weeks; with limitation of 0-90 for first 4 weeks. Pt goes for manip under anesth on 03/02/22.   PRECAUTIONS: ACL protocol  SUBJECTIVE: Patient will see surgeon tomorrow. Has been compliant with HEP. Felt sore after last session.   PAIN:  Are you having pain? Yes: NPRS scale: 2/10 Pain location: Rt Patella, quad   Pain description: post surgical Aggravating factors: movement Relieving factors: elevating, ice, rest  INTERVENTION   Right Left  Hip flexion 4- 4+  Hip Abduction 4- 4+  Hip Adduction 4- 4+  Knee Extension  4 4+  Knee Flexion 3+ 4  DF 5 5  PF 4+ 5    therEx:   Bicycle: forward  and backward x 4 minutes; cues for foot placement   Heel slides with quad mobilizations with movement 10 times with 5-second holds at endrange -125 degrees flexion AROM   Stair knee flexion/extension 15x  Single leg sit to stand 10x each LE   Manual:  Passive range of motion of right knee with 10-second holds at end range x5 minutes prone; x 5 minutes supine  Patella mobilization x 4 minutes   Trigger Point Dry Needling (TDN), unbilled Education performed with patient regarding potential benefit of TDN. Reviewed precautions and risks with patient. Reviewed special precautions/risks over lung fields which include pneumothorax. Reviewed signs and symptoms of pneumothorax and advised pt to go to ER immediately if these symptoms develop advise them of dry needling treatment. Extensive time spent with pt to ensure  full understanding of TDN risks. Pt provided verbal consent to treatment. TDN performed to  with 0.25 x 40 single needle placements with local twitch response (LTR). Pistoning technique utilized. Improved pain-free motion following intervention. R hamstring x 2 minutes           PATIENT EDUCATION: Education details: exercise technique, body mechanics, protocol Person educated: Patient Education method: Explanation, Demonstration, Tactile cues, and Verbal cues Education comprehension: verbalized understanding, returned demonstration, verbal cues required, and tactile cues required   HOME EXERCISE PROGRAM: Access Code: [email or web address] URL: [email or web address] Date: 12/15/2021 Prepared by: [name]  Exercises - Long Sitting Quad Set  - 1 x daily - 7 x weekly - 2 sets - 10 reps - 5 hold - Active Straight Leg Raise with Quad Set  - 1 x daily - 7 x weekly - 2 sets - 10 reps - 5 hold - Mini Squat with Counter Support  - 1 x daily - 7 x weekly - 2 sets - 10 reps - 5 hold - Side Lunge with Counter Support  - 1 x daily - 7 x weekly - 2 sets - 10 reps - 5 hold - Seated Ankle Alphabet  - 1 x daily - 7 x weekly - 2 sets - 10 reps - 5 hold   PT Short Term Goals       PT SHORT TERM GOAL #1   Title Patient will be independent in home exercise program to improve strength/mobility for better functional independence with ADLs.    Baseline 6/12; HEP given 7/20 : HEP compliant    Time 4    Status Achieved   Target Date 01/02/22      PT SHORT TERM GOAL #2   Title Patient will report a worst pain of 5/10 on VAS in R knee to improve tolerance with ADLs and reduced symptoms with activities.    Baseline 6/12: 8/10 7/20: 6/10 8/8: 5/10    Time 4    Period Weeks    Status MET   Target Date 01/02/22      PT SHORT TERM GOAL #3   Title Patient will increase 10 meter walk test to >1.66ms with LRAD as to improve gait speed for better community ambulation and to reduce fall risk.     Baseline 6/12:0.77 with crutches 7/20: 1.6 m/s    Time 4    Period Weeks    Status Achieved   Target Date 01/02/22      PT SHORT TERM GOAL #4   Title --    Baseline --    Time --    Period --    Status --    Target Date --      PT SHORT TERM GOAL #5   Title --    Baseline --    Time --    Period --    Status --    Target Date --  PT Long Term Goals      PT LONG TERM GOAL #1   Title Patient will increase FOTO score to equal to or greater than  71   to demonstrate statistically significant improvement in mobility and quality of life.    Baseline 6/12: 30 7/20: 51 8/8: 65% 8/31: 64% 10/5: 67%   Time 8    Period Weeks    Status Partially Met   Target Date 05/25/2022         PT LONG TERM GOAL #2   Title Patient will report a worst pain of 3/10 on VAS in  R knee  to improve tolerance with ADLs and reduced symptoms with activities.    Baseline 6/12: 8/10 pain 7/20: 6/10 8/8: 5/10 8/31: 6/10 10/5: 5/10    Time 8    Period Weeks    Status Partially Met   Target Date 05/25/2022         PT LONG TERM GOAL #3   Title Patient will increase lower extremity functional scale to >60/80 to demonstrate improved functional mobility and increased tolerance with ADLs.    Baseline 6/12: 19/80 7/20: 45/80 8/8: 49/80 8/31 55/80 10/5: 58/80   Time 8    Period Weeks    Status Partially Met   Target Date 05/25/2022         PT LONG TERM GOAL #4   Title Patient will improve R knee ROM (neutral to 120 degrees) for return to PLOF and safe functional mobility.    Baseline 6/12: locked in extension 7/20: seated 85 supine AROM: 75    extension: AROM -5 PROM -2 8/8:  supine AROM 75 PROM 80;   seated: AROM 73 PROM 86 8/31: supine:AROM 75 PROM 84   sitting; AROM 79 PROM 92 10/5: 128 degrees   Time 8    Period Weeks    Status MET   Target Date 03/28/2022       PT LONG TERM GOAL #5   Title Patient will increase six minute walk test distance to >1000 for progression to  community ambulator and improve gait ability    Baseline 6/12: unable to test 7/20: 1340 w/o brace 8/8: 1690 ft    Time 8    Period Weeks    Status MET   Target Date 03/28/2022     PT LONG TERM GOAL #6  Title Patient will return to sport and running 5 miles without pain increase for return to PLOF   Baseline 8/8: unable to play or run 8/31: unable to 10/5: not cleared for return to sport yet  Time 8   Period Weeks   Status On going  Target Date 05/25/2022           PT LONG TERM GOAL #7  Title Patient will increase BLE gross strength to 5/5 as to improve functional strength for independent gait, increased standing tolerance and increased ADL ability.  Baseline 10/5: see above  Time 8   Period Weeks   Status New  Target Date 05/25/2022             Plan       Clinical Impression Statement Patient has met his ROM goal and is progressing with functional mobility. New focus on strengthening and return to PLOF. Patient to see surgeon tomorrow for clearance to return to sport protocol. Patient is progressing well and has a good prognosis at this time. New goal addressing strength added to POC.  Patient will continue to benefit from skilled  physical therapy interventions in order to improve right knee range of motion, strength, in order to return allow return to his previous level of function.   Personal Factors and Comorbidities Profession;Transportation    Examination-Activity Limitations Bathing;Bed Mobility;Bend;Caring for Others;Carry;Dressing;Hygiene/Grooming;Lift;Locomotion Level;Toileting;Stand;Stairs;Squat;Sit;Transfers    Examination-Participation Restrictions Cleaning;Community Activity;Driving;Meal Prep;Laundry;Occupation;Shop;Yard Work;Volunteer    Stability/Clinical Decision Making Stable/Uncomplicated    Rehab Potential Good    PT Frequency 2x / week    PT Duration 8 weeks    PT Treatment/Interventions ADLs/Self Care Home Management;Cryotherapy;Electrical  Stimulation;Iontophoresis 76m/ml Dexamethasone;Moist Heat;Gait training;DME Instruction;Stair training;Functional mobility training;Therapeutic activities;Therapeutic exercise;Patient/family education;Neuromuscular re-education;Balance training;Orthotic Fit/Training;Manual techniques;Passive range of motion;Scar mobilization;Manual lymph drainage;Dry needling;Energy conservation;Splinting;Taping    PT Next Visit Plan    PT Home Exercise Plan see above    Consulted and Agree with Plan of Care Patient            MJanna ArchPT    03/30/2022, 8:03 AM

## 2022-03-30 ENCOUNTER — Ambulatory Visit: Payer: 59

## 2022-03-30 DIAGNOSIS — M25661 Stiffness of right knee, not elsewhere classified: Secondary | ICD-10-CM

## 2022-03-30 DIAGNOSIS — M25561 Pain in right knee: Secondary | ICD-10-CM | POA: Diagnosis not present

## 2022-03-30 DIAGNOSIS — R262 Difficulty in walking, not elsewhere classified: Secondary | ICD-10-CM

## 2022-04-03 NOTE — Therapy (Signed)
OUTPATIENT PHYSICAL THERAPY TREATMENT NOTE/Physical Therapy Progress Note   Dates of reporting period  02/23/22   to   04/04/22      Patient Name: Douglas Adams MRN: [id number] DOB:26-Nov-2000, 21 y.o., male Today's Date: 04/03/2022  PCP: Saddie Benders MD  REFERRING PROVIDER: Saddie Benders MD                    Past Medical History:  Diagnosis Date   Allergies    PONV (postoperative nausea and vomiting) 12/01/2021   pt's with extended recovery- vomiting x 3 hrs.   Past Surgical History:  Procedure Laterality Date   ADENOIDECTOMY AND MYRINGOTOMY WITH TUBE PLACEMENT Bilateral 2007   ANTERIOR CRUCIATE LIGAMENT REPAIR Right 12/01/2021   Procedure: RECONSTRUCTION ANTERIOR CRUCIATE LIGAMENT (ACL);  Surgeon: Hiram Gash, MD;  Location: [place];  Service: Orthopedics;  Laterality: Right;   KNEE ARTHROSCOPY Right 03/02/2022   Procedure: ARTHROSCOPY KNEE;  Surgeon: Hiram Gash, MD;  Location: [place];  Service: Orthopedics;  Laterality: Right;   LYSIS OF ADHESION Right 03/02/2022   Procedure: LYSIS OF ADHESION/MANIPULATION;  Surgeon: Hiram Gash, MD;  Location: [place];  Service: Orthopedics;  Laterality: Right;   There are no problems to display for this patient.   REFERRING DIAG: ACL reconstruction and arthroscopy of R knee  THERAPY DIAG:  No diagnosis found.  Rationale for Evaluation and Treatment Rehabilitation  PERTINENT HISTORY: Patient is a 21 year old male s/p ACL reconstruction with meniscal repair 12/01/21. Patient was injured with a twisting injury while playing basketball where he felt a pop and was unable to bear weight. Protocol not provided however [name] has protocol from clinic: knee extension lock for one week, d/c brace by 4 weeks if quad control appropriate. Progress PROM AAROM and AROM as tolerated for 0-6 weeks; with limitation of 0-90 for first 4 weeks. Pt goes for manip under anesth on  03/02/22.   PRECAUTIONS: ACL protocol  SUBJECTIVE: Patient will see surgeon tomorrow. Has been compliant with HEP. Felt sore after last session.   PAIN:  Are you having pain? Yes: NPRS scale: 2/10 Pain location: Rt Patella, quad   Pain description: post surgical Aggravating factors: movement Relieving factors: elevating, ice, rest  INTERVENTION   Right Left  Hip flexion 4- 4+  Hip Abduction 4- 4+  Hip Adduction 4- 4+  Knee Extension  4 4+  Knee Flexion 3+ 4  DF 5 5  PF 4+ 5    therEx:   Bicycle: forward  and backward x 4 minutes; cues for foot placement   Heel slides with quad mobilizations with movement 10 times with 5-second holds at endrange -125 degrees flexion AROM   Stair knee flexion/extension 15x  Single leg sit to stand 10x each LE   Manual:   Passive range of motion of right knee with 10-second holds at end range x5 minutes prone; x 5 minutes supine  Patella mobilization x 4 minutes   Trigger Point Dry Needling (TDN), unbilled Education performed with patient regarding potential benefit of TDN. Reviewed precautions and risks with patient. Reviewed special precautions/risks over lung fields which include pneumothorax. Reviewed signs and symptoms of pneumothorax and advised pt to go to ER immediately if these symptoms develop advise them of dry needling treatment. Extensive time spent with pt to ensure full understanding of TDN risks. Pt provided verbal consent to treatment. TDN performed to  with 0.25 x 40 single needle placements with local  twitch response (LTR). Pistoning technique utilized. Improved pain-free motion following intervention. R hamstring x 2 minutes           PATIENT EDUCATION: Education details: exercise technique, body mechanics, protocol Person educated: Patient Education method: Explanation, Demonstration, Tactile cues, and Verbal cues Education comprehension: verbalized understanding, returned demonstration, verbal cues required,  and tactile cues required   HOME EXERCISE PROGRAM: Access Code: [email or web address] URL: [email or web address] Date: 12/15/2021 Prepared by: [name]  Exercises - Long Sitting Quad Set  - 1 x daily - 7 x weekly - 2 sets - 10 reps - 5 hold - Active Straight Leg Raise with Quad Set  - 1 x daily - 7 x weekly - 2 sets - 10 reps - 5 hold - Mini Squat with Counter Support  - 1 x daily - 7 x weekly - 2 sets - 10 reps - 5 hold - Side Lunge with Counter Support  - 1 x daily - 7 x weekly - 2 sets - 10 reps - 5 hold - Seated Ankle Alphabet  - 1 x daily - 7 x weekly - 2 sets - 10 reps - 5 hold   PT Short Term Goals       PT SHORT TERM GOAL #1   Title Patient will be independent in home exercise program to improve strength/mobility for better functional independence with ADLs.    Baseline 6/12; HEP given 7/20 : HEP compliant    Time 4    Status Achieved   Target Date 01/02/22      PT SHORT TERM GOAL #2   Title Patient will report a worst pain of 5/10 on VAS in R knee to improve tolerance with ADLs and reduced symptoms with activities.    Baseline 6/12: 8/10 7/20: 6/10 8/8: 5/10    Time 4    Period Weeks    Status MET   Target Date 01/02/22      PT SHORT TERM GOAL #3   Title Patient will increase 10 meter walk test to >1.66m/s with LRAD as to improve gait speed for better community ambulation and to reduce fall risk.    Baseline 6/12:0.77 with crutches 7/20: 1.6 m/s    Time 4    Period Weeks    Status Achieved   Target Date 01/02/22      PT SHORT TERM GOAL #4   Title --    Baseline --    Time --    Period --    Status --    Target Date --      PT SHORT TERM GOAL #5   Title --    Baseline --    Time --    Period --    Status --    Target Date --              PT Long Term Goals      PT LONG TERM GOAL #1   Title Patient will increase FOTO score to equal to or greater than  71   to demonstrate statistically significant improvement in mobility and quality of  life.    Baseline 6/12: 30 7/20: 51 8/8: 65% 8/31: 64% 10/5: 67%   Time 8    Period Weeks    Status Partially Met   Target Date 05/25/2022         PT LONG TERM GOAL #2   Title Patient will report a worst pain of 3/10 on VAS in  R knee  to improve tolerance  with ADLs and reduced symptoms with activities.    Baseline 6/12: 8/10 pain 7/20: 6/10 8/8: 5/10 8/31: 6/10 10/5: 5/10    Time 8    Period Weeks    Status Partially Met   Target Date 05/25/2022         PT LONG TERM GOAL #3   Title Patient will increase lower extremity functional scale to >60/80 to demonstrate improved functional mobility and increased tolerance with ADLs.    Baseline 6/12: 19/80 7/20: 45/80 8/8: 49/80 8/31 55/80 10/5: 58/80   Time 8    Period Weeks    Status Partially Met   Target Date 05/25/2022         PT LONG TERM GOAL #4   Title Patient will improve R knee ROM (neutral to 120 degrees) for return to PLOF and safe functional mobility.    Baseline 6/12: locked in extension 7/20: seated 85 supine AROM: 75    extension: AROM -5 PROM -2 8/8:  supine AROM 75 PROM 80;   seated: AROM 73 PROM 86 8/31: supine:AROM 75 PROM 84   sitting; AROM 79 PROM 92 10/5: 128 degrees   Time 8    Period Weeks    Status MET   Target Date 03/28/2022       PT LONG TERM GOAL #5   Title Patient will increase six minute walk test distance to >1000 for progression to community ambulator and improve gait ability    Baseline 6/12: unable to test 7/20: 1340 w/o brace 8/8: 1690 ft    Time 8    Period Weeks    Status MET   Target Date 03/28/2022     PT LONG TERM GOAL #6  Title Patient will return to sport and running 5 miles without pain increase for return to PLOF   Baseline 8/8: unable to play or run 8/31: unable to 10/5: not cleared for return to sport yet  Time 8   Period Weeks   Status On going  Target Date 05/25/2022           PT LONG TERM GOAL #7  Title Patient will increase BLE gross strength to 5/5 as to  improve functional strength for independent gait, increased standing tolerance and increased ADL ability.  Baseline 10/5: see above  Time 8   Period Weeks   Status New  Target Date 05/25/2022             Plan       Clinical Impression Statement Patient has met his ROM goal and is progressing with functional mobility. New focus on strengthening and return to PLOF. Patient to see surgeon tomorrow for clearance to return to sport protocol. Patient is progressing well and has a good prognosis at this time. New goal addressing strength added to POC.  Patient will continue to benefit from skilled physical therapy interventions in order to improve right knee range of motion, strength, in order to return allow return to his previous level of function.   Personal Factors and Comorbidities Profession;Transportation    Examination-Activity Limitations Bathing;Bed Mobility;Bend;Caring for Others;Carry;Dressing;Hygiene/Grooming;Lift;Locomotion Level;Toileting;Stand;Stairs;Squat;Sit;Transfers    Examination-Participation Restrictions Cleaning;Community Activity;Driving;Meal Prep;Laundry;Occupation;Shop;Yard Work;Volunteer    Stability/Clinical Decision Making Stable/Uncomplicated    Rehab Potential Good    PT Frequency 2x / week    PT Duration 8 weeks    PT Treatment/Interventions ADLs/Self Care Home Management;Cryotherapy;Electrical Stimulation;Iontophoresis 79m/ml Dexamethasone;Moist Heat;Gait training;DME Instruction;Stair training;Functional mobility training;Therapeutic activities;Therapeutic exercise;Patient/family education;Neuromuscular re-education;Balance training;Orthotic Fit/Training;Manual techniques;Passive range of motion;Scar mobilization;Manual lymph drainage;Dry needling;Energy  conservation;Splinting;Taping    PT Next Visit Plan    PT Home Exercise Plan see above    Consulted and Agree with Plan of Care Patient            [name] PT    04/03/2022, 1:54 PM

## 2022-04-04 ENCOUNTER — Ambulatory Visit: Payer: 59

## 2022-04-04 DIAGNOSIS — R262 Difficulty in walking, not elsewhere classified: Secondary | ICD-10-CM

## 2022-04-04 DIAGNOSIS — M25561 Pain in right knee: Secondary | ICD-10-CM

## 2022-04-04 DIAGNOSIS — M25661 Stiffness of right knee, not elsewhere classified: Secondary | ICD-10-CM

## 2022-04-05 NOTE — Therapy (Signed)
OUTPATIENT PHYSICAL THERAPY TREATMENT NOTE     Patient Name: Douglas Adams MRN: [id number] DOB:01-30-2001, 21 y.o., male Today's Date: 04/06/2022  PCP: Saddie Benders MD  REFERRING PROVIDER: Saddie Benders MD   PT End of Session - 04/06/22 0743     Visit Number 31    Number of Visits 42    Date for PT Re-Evaluation 05/25/22    Authorization Type [employer] Employee; PN 1/10 10/10    Authorization Time Period 01/31/22-03/28/22    Progress Note Due on Visit 30    PT Start Time 0714    PT Stop Time 0759    PT Time Calculation (min) 45 min    Equipment Utilized During Treatment Other (comment);Right knee immobilizer    Activity Tolerance Patient tolerated treatment well    Behavior During Therapy [facility] for tasks assessed/performed                              Past Medical History:  Diagnosis Date   Allergies    PONV (postoperative nausea and vomiting) 12/01/2021   pt's with extended recovery- vomiting x 3 hrs.   Past Surgical History:  Procedure Laterality Date   ADENOIDECTOMY AND MYRINGOTOMY WITH TUBE PLACEMENT Bilateral 2007   ANTERIOR CRUCIATE LIGAMENT REPAIR Right 12/01/2021   Procedure: RECONSTRUCTION ANTERIOR CRUCIATE LIGAMENT (ACL);  Surgeon: Hiram Gash, MD;  Location: [place];  Service: Orthopedics;  Laterality: Right;   KNEE ARTHROSCOPY Right 03/02/2022   Procedure: ARTHROSCOPY KNEE;  Surgeon: Hiram Gash, MD;  Location: [place];  Service: Orthopedics;  Laterality: Right;   LYSIS OF ADHESION Right 03/02/2022   Procedure: LYSIS OF ADHESION/MANIPULATION;  Surgeon: Hiram Gash, MD;  Location: [place];  Service: Orthopedics;  Laterality: Right;   There are no problems to display for this patient.   REFERRING DIAG: ACL reconstruction and arthroscopy of R knee  THERAPY DIAG:  Acute pain of right knee  Stiffness of right knee, not elsewhere classified  Difficulty in walking, not elsewhere  classified  Rationale for Evaluation and Treatment Rehabilitation  PERTINENT HISTORY: Patient is a 21 year old male s/p ACL reconstruction with meniscal repair 12/01/21. Patient was injured with a twisting injury while playing basketball where he felt a pop and was unable to bear weight. Protocol not provided however [name] has protocol from clinic: knee extension lock for one week, d/c brace by 4 weeks if quad control appropriate. Progress PROM AAROM and AROM as tolerated for 0-6 weeks; with limitation of 0-90 for first 4 weeks. Pt goes for manip under anesth on 03/02/22.   PRECAUTIONS: ACL protocol  SUBJECTIVE: Patient presents with increased soreness/stiffness from change in weather.   PAIN:  Are you having pain? Yes: NPRS scale: 2/10 Pain location: Rt Patella, quad   Pain description: post surgical Aggravating factors: movement Relieving factors: elevating, ice, rest  INTERVENTION   Right Left  Hip flexion 4- 4+  Hip Abduction 4- 4+  Hip Adduction 4- 4+  Knee Extension  4 4+  Knee Flexion 3+ 4  DF 5 5  PF 4+ 5    therEx:  in wellzone:  Return to run protocol: Walk 5 minutes warm up -walk 25 yards, jog 50 yards ; repeat x6 trials  In PT gym:  Heel slides with quad mobilizations with movement 10 times with 5-second holds at endrange -127 degrees flexion AROM; 132 PROM   TRX:  Squat  10x Modified single limb squat 10x Lateral lunge 10x each side   Manual:   Patella mobilization x 4 minutes  Knee flexion 5x30 seconds   Ice cup massage x 4 minutes         PATIENT EDUCATION: Education details: exercise technique, body mechanics, protocol Person educated: Patient Education method: Explanation, Demonstration, Tactile cues, and Verbal cues Education comprehension: verbalized understanding, returned demonstration, verbal cues required, and tactile cues required   HOME EXERCISE PROGRAM: Access Code: [email or web address] URL: [email or web address] Date:  12/15/2021 Prepared by: [name]  Exercises - Long Sitting Quad Set  - 1 x daily - 7 x weekly - 2 sets - 10 reps - 5 hold - Active Straight Leg Raise with Quad Set  - 1 x daily - 7 x weekly - 2 sets - 10 reps - 5 hold - Mini Squat with Counter Support  - 1 x daily - 7 x weekly - 2 sets - 10 reps - 5 hold - Side Lunge with Counter Support  - 1 x daily - 7 x weekly - 2 sets - 10 reps - 5 hold - Seated Ankle Alphabet  - 1 x daily - 7 x weekly - 2 sets - 10 reps - 5 hold   PT Short Term Goals       PT SHORT TERM GOAL #1   Title Patient will be independent in home exercise program to improve strength/mobility for better functional independence with ADLs.    Baseline 6/12; HEP given 7/20 : HEP compliant    Time 4    Status Achieved   Target Date 01/02/22      PT SHORT TERM GOAL #2   Title Patient will report a worst pain of 5/10 on VAS in R knee to improve tolerance with ADLs and reduced symptoms with activities.    Baseline 6/12: 8/10 7/20: 6/10 8/8: 5/10    Time 4    Period Weeks    Status MET   Target Date 01/02/22      PT SHORT TERM GOAL #3   Title Patient will increase 10 meter walk test to >1.50ms with LRAD as to improve gait speed for better community ambulation and to reduce fall risk.    Baseline 6/12:0.77 with crutches 7/20: 1.6 m/s    Time 4    Period Weeks    Status Achieved   Target Date 01/02/22      PT SHORT TERM GOAL #4   Title --    Baseline --    Time --    Period --    Status --    Target Date --      PT SHORT TERM GOAL #5   Title --    Baseline --    Time --    Period --    Status --    Target Date --              PT Long Term Goals      PT LONG TERM GOAL #1   Title Patient will increase FOTO score to equal to or greater than  71   to demonstrate statistically significant improvement in mobility and quality of life.    Baseline 6/12: 30 7/20: 51 8/8: 65% 8/31: 64% 10/5: 67%   Time 8    Period Weeks    Status Partially Met   Target  Date 05/25/2022         PT LONG TERM GOAL #2   Title Patient will  report a worst pain of 3/10 on VAS in  R knee  to improve tolerance with ADLs and reduced symptoms with activities.    Baseline 6/12: 8/10 pain 7/20: 6/10 8/8: 5/10 8/31: 6/10 10/5: 5/10    Time 8    Period Weeks    Status Partially Met   Target Date 05/25/2022         PT LONG TERM GOAL #3   Title Patient will increase lower extremity functional scale to >60/80 to demonstrate improved functional mobility and increased tolerance with ADLs.    Baseline 6/12: 19/80 7/20: 45/80 8/8: 49/80 8/31 55/80 10/5: 58/80   Time 8    Period Weeks    Status Partially Met   Target Date 05/25/2022         PT LONG TERM GOAL #4   Title Patient will improve R knee ROM (neutral to 120 degrees) for return to PLOF and safe functional mobility.    Baseline 6/12: locked in extension 7/20: seated 85 supine AROM: 75    extension: AROM -5 PROM -2 8/8:  supine AROM 75 PROM 80;   seated: AROM 73 PROM 86 8/31: supine:AROM 75 PROM 84   sitting; AROM 79 PROM 92 10/5: 128 degrees   Time 8    Period Weeks    Status MET   Target Date 03/28/2022       PT LONG TERM GOAL #5   Title Patient will increase six minute walk test distance to >1000 for progression to community ambulator and improve gait ability    Baseline 6/12: unable to test 7/20: 1340 w/o brace 8/8: 1690 ft    Time 8    Period Weeks    Status MET   Target Date 03/28/2022     PT LONG TERM GOAL #6  Title Patient will return to sport and running 5 miles without pain increase for return to PLOF   Baseline 8/8: unable to play or run 8/31: unable to 10/5: not cleared for return to sport yet  Time 8   Period Weeks   Status On going  Target Date 05/25/2022           PT LONG TERM GOAL #7  Title Patient will increase BLE gross strength to 5/5 as to improve functional strength for independent gait, increased standing tolerance and increased ADL ability.  Baseline 10/5: see  above  Time 8   Period Weeks   Status New  Target Date 05/25/2022             Plan       Clinical Impression Statement Patient introduced to return to running protocol. He tolerates well with cueing for increased control with stance phase to improve gait fluidity. Patient does have increased stiffness at beginning of session that improves by end of session. Patient continues to have excellent prognosis at this time.  Patient will continue to benefit from skilled physical therapy interventions in order to improve right knee range of motion, strength, in order to return allow return to his previous level of function.   Personal Factors and Comorbidities Profession;Transportation    Examination-Activity Limitations Bathing;Bed Mobility;Bend;Caring for Others;Carry;Dressing;Hygiene/Grooming;Lift;Locomotion Level;Toileting;Stand;Stairs;Squat;Sit;Transfers    Examination-Participation Restrictions Cleaning;Community Activity;Driving;Meal Prep;Laundry;Occupation;Shop;Yard Work;Volunteer    Stability/Clinical Decision Making Stable/Uncomplicated    Rehab Potential Good    PT Frequency 2x / week    PT Duration 8 weeks    PT Treatment/Interventions ADLs/Self Care Home Management;Cryotherapy;Electrical Stimulation;Iontophoresis 56m/ml Dexamethasone;Moist Heat;Gait training;DME Instruction;Stair training;Functional mobility training;Therapeutic activities;Therapeutic exercise;Patient/family education;Neuromuscular  re-education;Balance training;Orthotic Fit/Training;Manual techniques;Passive range of motion;Scar mobilization;Manual lymph drainage;Dry needling;Energy conservation;Splinting;Taping    PT Next Visit Plan    PT Home Exercise Plan see above    Consulted and Agree with Plan of Care Patient            [name] PT    04/06/2022, 8:01 AM

## 2022-04-06 ENCOUNTER — Ambulatory Visit: Payer: 59

## 2022-04-06 DIAGNOSIS — M25561 Pain in right knee: Secondary | ICD-10-CM | POA: Diagnosis not present

## 2022-04-06 DIAGNOSIS — M25661 Stiffness of right knee, not elsewhere classified: Secondary | ICD-10-CM

## 2022-04-06 DIAGNOSIS — R262 Difficulty in walking, not elsewhere classified: Secondary | ICD-10-CM

## 2022-04-11 ENCOUNTER — Ambulatory Visit: Payer: 59

## 2022-04-12 ENCOUNTER — Ambulatory Visit: Payer: 59

## 2022-04-12 DIAGNOSIS — M25561 Pain in right knee: Secondary | ICD-10-CM

## 2022-04-12 DIAGNOSIS — R262 Difficulty in walking, not elsewhere classified: Secondary | ICD-10-CM

## 2022-04-12 DIAGNOSIS — M25661 Stiffness of right knee, not elsewhere classified: Secondary | ICD-10-CM

## 2022-04-12 NOTE — Therapy (Signed)
OUTPATIENT PHYSICAL THERAPY TREATMENT NOTE     Patient Name: Douglas Adams MRN: [id number] DOB:[birth year], 21 y.o., male Today's Date: 04/12/2022  PCP: Saddie Benders MD  REFERRING PROVIDER: Saddie Benders MD   PT End of Session - 04/12/22 0715     Visit Number 32    Number of Visits 42    Date for PT Re-Evaluation 05/25/22    Authorization Type [employer] Employee; PN 2/10 10/10    Authorization Time Period 01/31/22-03/28/22    Progress Note Due on Visit 30    PT Start Time 0715    PT Stop Time 0759    PT Time Calculation (min) 44 min    Equipment Utilized During Treatment Other (comment);Right knee immobilizer    Activity Tolerance Patient tolerated treatment well    Behavior During Therapy [facility] for tasks assessed/performed                               Past Medical History:  Diagnosis Date   Allergies    PONV (postoperative nausea and vomiting) 12/01/2021   pt's with extended recovery- vomiting x 3 hrs.   Past Surgical History:  Procedure Laterality Date   ADENOIDECTOMY AND MYRINGOTOMY WITH TUBE PLACEMENT Bilateral 2007   ANTERIOR CRUCIATE LIGAMENT REPAIR Right 12/01/2021   Procedure: RECONSTRUCTION ANTERIOR CRUCIATE LIGAMENT (ACL);  Surgeon: Hiram Gash, MD;  Location: [place];  Service: Orthopedics;  Laterality: Right;   KNEE ARTHROSCOPY Right 03/02/2022   Procedure: ARTHROSCOPY KNEE;  Surgeon: Hiram Gash, MD;  Location: [place];  Service: Orthopedics;  Laterality: Right;   LYSIS OF ADHESION Right 03/02/2022   Procedure: LYSIS OF ADHESION/MANIPULATION;  Surgeon: Hiram Gash, MD;  Location: [place];  Service: Orthopedics;  Laterality: Right;   There are no problems to display for this patient.   REFERRING DIAG: ACL reconstruction and arthroscopy of R knee  THERAPY DIAG:  Acute pain of right knee  Stiffness of right knee, not elsewhere classified  Difficulty in walking, not elsewhere  classified  Rationale for Evaluation and Treatment Rehabilitation  PERTINENT HISTORY: Patient is a 21 year old male s/p ACL reconstruction with meniscal repair 12/01/21. Patient was injured with a twisting injury while playing basketball where he felt a pop and was unable to bear weight. Protocol not provided however [name] has protocol from clinic: knee extension lock for one week, d/c brace by 4 weeks if quad control appropriate. Progress PROM AAROM and AROM as tolerated for 0-6 weeks; with limitation of 0-90 for first 4 weeks. Pt goes for manip under anesth on 03/02/22.   PRECAUTIONS: ACL protocol  SUBJECTIVE: Patient reports feeling a little stiff this morning due to the colder weather.   PAIN:  Are you having pain? Yes: NPRS scale: 2/10 Pain location: Rt Patella, quad   Pain description: post surgical Aggravating factors: movement Relieving factors: elevating, ice, rest  INTERVENTION   Right Left  Hip flexion 4- 4+  Hip Abduction 4- 4+  Hip Adduction 4- 4+  Knee Extension  4 4+  Knee Flexion 3+ 4  DF 5 5  PF 4+ 5    therEx:  in wellzone:  Return to run protocol: Walk 3 minutes warm up -walk 2.8 mps 1 minute; jog 5.0 mps 30 seconds x 5 trials  In PT gym:  Heel slides with quad mobilizations with movement 10 times with 5-second holds at endrange -135 degrees flexion  AROM;   Resisted ambulation #22.5 lateral stepping 2x each direction each LE  TRX:  Squat 10x Modified single limb squat 10x  6" step: knee flexion/extension. X2 minutes  Supine: Bridge 10x; with yoga black 10x   Manual:   Patella mobilization x 4 minutes   Ice cup massage x 4 minutes         PATIENT EDUCATION: Education details: exercise technique, body mechanics, protocol Person educated: Patient Education method: Explanation, Demonstration, Tactile cues, and Verbal cues Education comprehension: verbalized understanding, returned demonstration, verbal cues required, and tactile cues  required   HOME EXERCISE PROGRAM: Access Code: [email or web address] URL: [email or web address] Date: 12/15/2021 Prepared by: [name]  Exercises - Long Sitting Quad Set  - 1 x daily - 7 x weekly - 2 sets - 10 reps - 5 hold - Active Straight Leg Raise with Quad Set  - 1 x daily - 7 x weekly - 2 sets - 10 reps - 5 hold - Mini Squat with Counter Support  - 1 x daily - 7 x weekly - 2 sets - 10 reps - 5 hold - Side Lunge with Counter Support  - 1 x daily - 7 x weekly - 2 sets - 10 reps - 5 hold - Seated Ankle Alphabet  - 1 x daily - 7 x weekly - 2 sets - 10 reps - 5 hold   PT Short Term Goals       PT SHORT TERM GOAL #1   Title Patient will be independent in home exercise program to improve strength/mobility for better functional independence with ADLs.    Baseline 6/12; HEP given 7/20 : HEP compliant    Time 4    Status Achieved   Target Date 01/02/22      PT SHORT TERM GOAL #2   Title Patient will report a worst pain of 5/10 on VAS in R knee to improve tolerance with ADLs and reduced symptoms with activities.    Baseline 6/12: 8/10 7/20: 6/10 8/8: 5/10    Time 4    Period Weeks    Status MET   Target Date 01/02/22      PT SHORT TERM GOAL #3   Title Patient will increase 10 meter walk test to >1.39ms with LRAD as to improve gait speed for better community ambulation and to reduce fall risk.    Baseline 6/12:0.77 with crutches 7/20: 1.6 m/s    Time 4    Period Weeks    Status Achieved   Target Date 01/02/22      PT SHORT TERM GOAL #4   Title --    Baseline --    Time --    Period --    Status --    Target Date --      PT SHORT TERM GOAL #5   Title --    Baseline --    Time --    Period --    Status --    Target Date --              PT Long Term Goals      PT LONG TERM GOAL #1   Title Patient will increase FOTO score to equal to or greater than  71   to demonstrate statistically significant improvement in mobility and quality of life.    Baseline  6/12: 30 7/20: 51 8/8: 65% 8/31: 64% 10/5: 67%   Time 8    Period Weeks    Status Partially Met  Target Date 05/25/2022         PT LONG TERM GOAL #2   Title Patient will report a worst pain of 3/10 on VAS in  R knee  to improve tolerance with ADLs and reduced symptoms with activities.    Baseline 6/12: 8/10 pain 7/20: 6/10 8/8: 5/10 8/31: 6/10 10/5: 5/10    Time 8    Period Weeks    Status Partially Met   Target Date 05/25/2022         PT LONG TERM GOAL #3   Title Patient will increase lower extremity functional scale to >60/80 to demonstrate improved functional mobility and increased tolerance with ADLs.    Baseline 6/12: 19/80 7/20: 45/80 8/8: 49/80 8/31 55/80 10/5: 58/80   Time 8    Period Weeks    Status Partially Met   Target Date 05/25/2022         PT LONG TERM GOAL #4   Title Patient will improve R knee ROM (neutral to 120 degrees) for return to PLOF and safe functional mobility.    Baseline 6/12: locked in extension 7/20: seated 85 supine AROM: 75    extension: AROM -5 PROM -2 8/8:  supine AROM 75 PROM 80;   seated: AROM 73 PROM 86 8/31: supine:AROM 75 PROM 84   sitting; AROM 79 PROM 92 10/5: 128 degrees   Time 8    Period Weeks    Status MET   Target Date 03/28/2022       PT LONG TERM GOAL #5   Title Patient will increase six minute walk test distance to >1000 for progression to community ambulator and improve gait ability    Baseline 6/12: unable to test 7/20: 1340 w/o brace 8/8: 1690 ft    Time 8    Period Weeks    Status MET   Target Date 03/28/2022     PT LONG TERM GOAL #6  Title Patient will return to sport and running 5 miles without pain increase for return to PLOF   Baseline 8/8: unable to play or run 8/31: unable to 10/5: not cleared for return to sport yet  Time 8   Period Weeks   Status On going  Target Date 05/25/2022           PT LONG TERM GOAL #7  Title Patient will increase BLE gross strength to 5/5 as to improve functional  strength for independent gait, increased standing tolerance and increased ADL ability.  Baseline 10/5: see above  Time 8   Period Weeks   Status New  Target Date 05/25/2022             Plan       Clinical Impression Statement Patient tolerates progressive running protocol with fatigue with prolonged muscle recruitment. Gentle strengthening tolerated well with no pain increase. Increased swelling in knee. Patient educated on need for continued icing for optimal swelling reduction.  Patient will continue to benefit from skilled physical therapy interventions in order to improve right knee range of motion, strength, in order to return allow return to his previous level of function.   Personal Factors and Comorbidities Profession;Transportation    Examination-Activity Limitations Bathing;Bed Mobility;Bend;Caring for Others;Carry;Dressing;Hygiene/Grooming;Lift;Locomotion Level;Toileting;Stand;Stairs;Squat;Sit;Transfers    Examination-Participation Restrictions Cleaning;Community Activity;Driving;Meal Prep;Laundry;Occupation;Shop;Yard Work;Volunteer    Stability/Clinical Decision Making Stable/Uncomplicated    Rehab Potential Good    PT Frequency 2x / week    PT Duration 8 weeks    PT Treatment/Interventions ADLs/Self Care Home Management;Cryotherapy;Electrical Stimulation;Iontophoresis 69m/ml Dexamethasone;Moist  Heat;Gait training;DME Instruction;Stair training;Functional mobility training;Therapeutic activities;Therapeutic exercise;Patient/family education;Neuromuscular re-education;Balance training;Orthotic Fit/Training;Manual techniques;Passive range of motion;Scar mobilization;Manual lymph drainage;Dry needling;Energy conservation;Splinting;Taping    PT Next Visit Plan    PT Home Exercise Plan see above    Consulted and Agree with Plan of Care Patient            [name] PT    04/12/2022, 7:59 AM

## 2022-04-13 ENCOUNTER — Ambulatory Visit: Payer: 59

## 2022-04-17 NOTE — Therapy (Signed)
OUTPATIENT PHYSICAL THERAPY TREATMENT NOTE     Patient Name: Douglas Adams MRN: [id number] DOB:2001/06/01, 21 y.o., male Today's Date: 04/18/2022  PCP: Saddie Benders MD  REFERRING PROVIDER: Saddie Benders MD   PT End of Session - 04/18/22 0712     Visit Number 33    Number of Visits 42    Date for PT Re-Evaluation 05/25/22    Authorization Type [employer] Employee; PN 3/10 10/10    Authorization Time Period 01/31/22-03/28/22    Progress Note Due on Visit 30    PT Start Time 0713    PT Stop Time 0756    PT Time Calculation (min) 43 min    Equipment Utilized During Treatment Other (comment);Right knee immobilizer    Activity Tolerance Patient tolerated treatment well    Behavior During Therapy [facility] for tasks assessed/performed                                Past Medical History:  Diagnosis Date   Allergies    PONV (postoperative nausea and vomiting) 12/01/2021   pt's with extended recovery- vomiting x 3 hrs.   Past Surgical History:  Procedure Laterality Date   ADENOIDECTOMY AND MYRINGOTOMY WITH TUBE PLACEMENT Bilateral 2007   ANTERIOR CRUCIATE LIGAMENT REPAIR Right 12/01/2021   Procedure: RECONSTRUCTION ANTERIOR CRUCIATE LIGAMENT (ACL);  Surgeon: Hiram Gash, MD;  Location: [place];  Service: Orthopedics;  Laterality: Right;   KNEE ARTHROSCOPY Right 03/02/2022   Procedure: ARTHROSCOPY KNEE;  Surgeon: Hiram Gash, MD;  Location: [place];  Service: Orthopedics;  Laterality: Right;   LYSIS OF ADHESION Right 03/02/2022   Procedure: LYSIS OF ADHESION/MANIPULATION;  Surgeon: Hiram Gash, MD;  Location: [place];  Service: Orthopedics;  Laterality: Right;   There are no problems to display for this patient.   REFERRING DIAG: ACL reconstruction and arthroscopy of R knee  THERAPY DIAG:  Acute pain of right knee  Stiffness of right knee, not elsewhere classified  Difficulty in walking, not elsewhere  classified  Rationale for Evaluation and Treatment Rehabilitation  PERTINENT HISTORY: Patient is a 21 year old male s/p ACL reconstruction with meniscal repair 12/01/21. Patient was injured with a twisting injury while playing basketball where he felt a pop and was unable to bear weight. Protocol not provided however [name] has protocol from clinic: knee extension lock for one week, d/c brace by 4 weeks if quad control appropriate. Progress PROM AAROM and AROM as tolerated for 0-6 weeks; with limitation of 0-90 for first 4 weeks. Pt goes for manip under anesth on 03/02/22.   PRECAUTIONS: ACL protocol  SUBJECTIVE: Patient presents with increased stiffness from colder weather. Has been compliant with HEP.   PAIN:  Are you having pain? Yes: NPRS scale: 6/10 Pain location: Rt Patella, quad   Pain description: post surgical Aggravating factors: movement Relieving factors: elevating, ice, rest  INTERVENTION   Right Left  Hip flexion 4- 4+  Hip Abduction 4- 4+  Hip Adduction 4- 4+  Knee Extension  4 4+  Knee Flexion 3+ 4  DF 5 5  PF 4+ 5    therEx:  in wellzone: Bike: Forward/backwards cycle x 4 inutes  Return to run protocol: Walk 3 minutes warm up -walk 3.0 mps 1 minute; jog 6.0 mps 30 seconds x 5 trials  In PT gym:  Heel slides with quad mobilizations with movement 10 times with  5-second holds at endrange -135 degrees flexion AROM;   TRX:  Squat 10x Modified single limb squat 10x each Le Lateral lunge 10x   Stool forward/backwards scooting 40 ft x 2 trials each direction   Manual:   Ice cup massage x 5 minutes    Trigger Point Dry Needling (TDN), unbilled Education performed with patient regarding potential benefit of TDN. Reviewed precautions and risks with patient. Reviewed special precautions/risks over lung fields which include pneumothorax. Reviewed signs and symptoms of pneumothorax and advised pt to go to ER immediately if these symptoms develop advise them of  dry needling treatment. Extensive time spent with pt to ensure full understanding of TDN risks. Pt provided verbal consent to treatment. TDN performed to  with 0.3 x 60 single needle placements with local twitch response (LTR). Pistoning technique utilized. Improved pain-free motion following intervention. R hamstring and gastroc x 3 minutes       PATIENT EDUCATION: Education details: exercise technique, body mechanics, protocol Person educated: Patient Education method: Explanation, Demonstration, Tactile cues, and Verbal cues Education comprehension: verbalized understanding, returned demonstration, verbal cues required, and tactile cues required   HOME EXERCISE PROGRAM: Access Code: [email or web address] URL: [email or web address] Date: 12/15/2021 Prepared by: [name]  Exercises - Long Sitting Quad Set  - 1 x daily - 7 x weekly - 2 sets - 10 reps - 5 hold - Active Straight Leg Raise with Quad Set  - 1 x daily - 7 x weekly - 2 sets - 10 reps - 5 hold - Mini Squat with Counter Support  - 1 x daily - 7 x weekly - 2 sets - 10 reps - 5 hold - Side Lunge with Counter Support  - 1 x daily - 7 x weekly - 2 sets - 10 reps - 5 hold - Seated Ankle Alphabet  - 1 x daily - 7 x weekly - 2 sets - 10 reps - 5 hold   PT Short Term Goals       PT SHORT TERM GOAL #1   Title Patient will be independent in home exercise program to improve strength/mobility for better functional independence with ADLs.    Baseline 6/12; HEP given 7/20 : HEP compliant    Time 4    Status Achieved   Target Date 01/02/22      PT SHORT TERM GOAL #2   Title Patient will report a worst pain of 5/10 on VAS in R knee to improve tolerance with ADLs and reduced symptoms with activities.    Baseline 6/12: 8/10 7/20: 6/10 8/8: 5/10    Time 4    Period Weeks    Status MET   Target Date 01/02/22      PT SHORT TERM GOAL #3   Title Patient will increase 10 meter walk test to >1.79ms with LRAD as to improve gait speed  for better community ambulation and to reduce fall risk.    Baseline 6/12:0.77 with crutches 7/20: 1.6 m/s    Time 4    Period Weeks    Status Achieved   Target Date 01/02/22      PT SHORT TERM GOAL #4   Title --    Baseline --    Time --    Period --    Status --    Target Date --      PT SHORT TERM GOAL #5   Title --    Baseline --    Time --    Period --  Status --    Target Date --              PT Long Term Goals      PT LONG TERM GOAL #1   Title Patient will increase FOTO score to equal to or greater than  71   to demonstrate statistically significant improvement in mobility and quality of life.    Baseline 6/12: 30 7/20: 51 8/8: 65% 8/31: 64% 10/5: 67%   Time 8    Period Weeks    Status Partially Met   Target Date 05/25/2022         PT LONG TERM GOAL #2   Title Patient will report a worst pain of 3/10 on VAS in  R knee  to improve tolerance with ADLs and reduced symptoms with activities.    Baseline 6/12: 8/10 pain 7/20: 6/10 8/8: 5/10 8/31: 6/10 10/5: 5/10    Time 8    Period Weeks    Status Partially Met   Target Date 05/25/2022         PT LONG TERM GOAL #3   Title Patient will increase lower extremity functional scale to >60/80 to demonstrate improved functional mobility and increased tolerance with ADLs.    Baseline 6/12: 19/80 7/20: 45/80 8/8: 49/80 8/31 55/80 10/5: 58/80   Time 8    Period Weeks    Status Partially Met   Target Date 05/25/2022         PT LONG TERM GOAL #4   Title Patient will improve R knee ROM (neutral to 120 degrees) for return to PLOF and safe functional mobility.    Baseline 6/12: locked in extension 7/20: seated 85 supine AROM: 75    extension: AROM -5 PROM -2 8/8:  supine AROM 75 PROM 80;   seated: AROM 73 PROM 86 8/31: supine:AROM 75 PROM 84   sitting; AROM 79 PROM 92 10/5: 128 degrees   Time 8    Period Weeks    Status MET   Target Date 03/28/2022       PT LONG TERM GOAL #5   Title Patient will  increase six minute walk test distance to >1000 for progression to community ambulator and improve gait ability    Baseline 6/12: unable to test 7/20: 1340 w/o brace 8/8: 1690 ft    Time 8    Period Weeks    Status MET   Target Date 03/28/2022     PT LONG TERM GOAL #6  Title Patient will return to sport and running 5 miles without pain increase for return to PLOF   Baseline 8/8: unable to play or run 8/31: unable to 10/5: not cleared for return to sport yet  Time 8   Period Weeks   Status On going  Target Date 05/25/2022           PT LONG TERM GOAL #7  Title Patient will increase BLE gross strength to 5/5 as to improve functional strength for independent gait, increased standing tolerance and increased ADL ability.  Baseline 10/5: see above  Time 8   Period Weeks   Status New  Target Date 05/25/2022             Plan       Clinical Impression Statement Patient presents with excellent motivation throughout session. He has improved range of motion and muscle recruitment as session progressed with decreased ache from 5-6 to 4. Hamstring and gastroc needled today with significant trigger points released.  Patient will continue to benefit from skilled physical therapy interventions in order to improve right knee range of motion, strength, in order to return allow return to his previous level of function.   Personal Factors and Comorbidities Profession;Transportation    Examination-Activity Limitations Bathing;Bed Mobility;Bend;Caring for Others;Carry;Dressing;Hygiene/Grooming;Lift;Locomotion Level;Toileting;Stand;Stairs;Squat;Sit;Transfers    Examination-Participation Restrictions Cleaning;Community Activity;Driving;Meal Prep;Laundry;Occupation;Shop;Yard Work;Volunteer    Stability/Clinical Decision Making Stable/Uncomplicated    Rehab Potential Good    PT Frequency 2x / week    PT Duration 8 weeks    PT Treatment/Interventions ADLs/Self Care Home  Management;Cryotherapy;Electrical Stimulation;Iontophoresis 33m/ml Dexamethasone;Moist Heat;Gait training;DME Instruction;Stair training;Functional mobility training;Therapeutic activities;Therapeutic exercise;Patient/family education;Neuromuscular re-education;Balance training;Orthotic Fit/Training;Manual techniques;Passive range of motion;Scar mobilization;Manual lymph drainage;Dry needling;Energy conservation;Splinting;Taping    PT Next Visit Plan    PT Home Exercise Plan see above    Consulted and Agree with Plan of Care Patient            MJanna ArchPT    04/18/2022, 8:00 AM

## 2022-04-18 ENCOUNTER — Ambulatory Visit: Payer: 59

## 2022-04-18 DIAGNOSIS — M25661 Stiffness of right knee, not elsewhere classified: Secondary | ICD-10-CM

## 2022-04-18 DIAGNOSIS — M25561 Pain in right knee: Secondary | ICD-10-CM | POA: Diagnosis not present

## 2022-04-18 DIAGNOSIS — R262 Difficulty in walking, not elsewhere classified: Secondary | ICD-10-CM

## 2022-04-19 NOTE — Therapy (Signed)
OUTPATIENT PHYSICAL THERAPY TREATMENT NOTE     Patient Name: Douglas Adams MRN: [id number] DOB:2000/09/30, 21 y.o., male Today's Date: 04/20/2022  PCP: Saddie Benders MD  REFERRING PROVIDER: Saddie Benders MD   PT End of Session - 04/20/22 0715     Visit Number 34    Number of Visits 42    Date for PT Re-Evaluation 05/25/22    Authorization Type [employer] Employee; PN 4/10 10/10    Authorization Time Period 01/31/22-03/28/22    Progress Note Due on Visit 30    PT Start Time 0715    PT Stop Time 0758    PT Time Calculation (min) 43 min    Equipment Utilized During Treatment Other (comment);Right knee immobilizer    Activity Tolerance Patient tolerated treatment well    Behavior During Therapy [facility] for tasks assessed/performed                                 Past Medical History:  Diagnosis Date   Allergies    PONV (postoperative nausea and vomiting) 12/01/2021   pt's with extended recovery- vomiting x 3 hrs.   Past Surgical History:  Procedure Laterality Date   ADENOIDECTOMY AND MYRINGOTOMY WITH TUBE PLACEMENT Bilateral 2007   ANTERIOR CRUCIATE LIGAMENT REPAIR Right 12/01/2021   Procedure: RECONSTRUCTION ANTERIOR CRUCIATE LIGAMENT (ACL);  Surgeon: Hiram Gash, MD;  Location: [place];  Service: Orthopedics;  Laterality: Right;   KNEE ARTHROSCOPY Right 03/02/2022   Procedure: ARTHROSCOPY KNEE;  Surgeon: Hiram Gash, MD;  Location: [place];  Service: Orthopedics;  Laterality: Right;   LYSIS OF ADHESION Right 03/02/2022   Procedure: LYSIS OF ADHESION/MANIPULATION;  Surgeon: Hiram Gash, MD;  Location: [place];  Service: Orthopedics;  Laterality: Right;   There are no problems to display for this patient.   REFERRING DIAG: ACL reconstruction and arthroscopy of R knee  THERAPY DIAG:  Acute pain of right knee  Stiffness of right knee, not elsewhere classified  Difficulty in walking, not  elsewhere classified  Rationale for Evaluation and Treatment Rehabilitation  PERTINENT HISTORY: Patient is a 21 year old male s/p ACL reconstruction with meniscal repair 12/01/21. Patient was injured with a twisting injury while playing basketball where he felt a pop and was unable to bear weight. Protocol not provided however [name] has protocol from clinic: knee extension lock for one week, d/c brace by 4 weeks if quad control appropriate. Progress PROM AAROM and AROM as tolerated for 0-6 weeks; with limitation of 0-90 for first 4 weeks. Pt goes for manip under anesth on 03/02/22.   PRECAUTIONS: ACL protocol  SUBJECTIVE: Patient reports he was feeling better after last session. Has been compliant with HEP   PAIN:  Are you having pain? Yes: NPRS scale: 2/10 Pain location: Rt Patella, quad   Pain description: post surgical Aggravating factors: movement Relieving factors: elevating, ice, rest  INTERVENTION   Right Left  Hip flexion 4- 4+  Hip Abduction 4- 4+  Hip Adduction 4- 4+  Knee Extension  4 4+  Knee Flexion 3+ 4  DF 5 5  PF 4+ 5    therEx:  in wellzone: Bike: Forward/backwards cycle x 4 inutes  Return to run protocol: Walk 2 minutes warm up at 3.2  -walk 3.2 mps 1 minute; jog 6.0 mps 30 seconds x 5 trials  In PT gym:  Lateral squat walk 60 ft  x 2 trials Walking lunges 60 ft x 2 trials Single leg squat with SUE support 10x    Manual:   Ice cup massage x 5 minutes    Trigger Point Dry Needling (TDN), unbilled Education performed with patient regarding potential benefit of TDN. Reviewed precautions and risks with patient. Reviewed special precautions/risks over lung fields which include pneumothorax. Reviewed signs and symptoms of pneumothorax and advised pt to go to ER immediately if these symptoms develop advise them of dry needling treatment. Extensive time spent with pt to ensure full understanding of TDN risks. Pt provided verbal consent to treatment. TDN  performed to  with 0.3 x 60 single needle placements with local twitch response (LTR). Pistoning technique utilized. Improved pain-free motion following intervention. bilateral hamstring and gastroc x 3 minutes       PATIENT EDUCATION: Education details: exercise technique, body mechanics, protocol Person educated: Patient Education method: Explanation, Demonstration, Tactile cues, and Verbal cues Education comprehension: verbalized understanding, returned demonstration, verbal cues required, and tactile cues required   HOME EXERCISE PROGRAM: Access Code: [email or web address] URL: [email or web address] Date: 12/15/2021 Prepared by: [name]  Exercises - Long Sitting Quad Set  - 1 x daily - 7 x weekly - 2 sets - 10 reps - 5 hold - Active Straight Leg Raise with Quad Set  - 1 x daily - 7 x weekly - 2 sets - 10 reps - 5 hold - Mini Squat with Counter Support  - 1 x daily - 7 x weekly - 2 sets - 10 reps - 5 hold - Side Lunge with Counter Support  - 1 x daily - 7 x weekly - 2 sets - 10 reps - 5 hold - Seated Ankle Alphabet  - 1 x daily - 7 x weekly - 2 sets - 10 reps - 5 hold   PT Short Term Goals       PT SHORT TERM GOAL #1   Title Patient will be independent in home exercise program to improve strength/mobility for better functional independence with ADLs.    Baseline 6/12; HEP given 7/20 : HEP compliant    Time 4    Status Achieved   Target Date 01/02/22      PT SHORT TERM GOAL #2   Title Patient will report a worst pain of 5/10 on VAS in R knee to improve tolerance with ADLs and reduced symptoms with activities.    Baseline 6/12: 8/10 7/20: 6/10 8/8: 5/10    Time 4    Period Weeks    Status MET   Target Date 01/02/22      PT SHORT TERM GOAL #3   Title Patient will increase 10 meter walk test to >1.17ms with LRAD as to improve gait speed for better community ambulation and to reduce fall risk.    Baseline 6/12:0.77 with crutches 7/20: 1.6 m/s    Time 4    Period  Weeks    Status Achieved   Target Date 01/02/22      PT SHORT TERM GOAL #4   Title --    Baseline --    Time --    Period --    Status --    Target Date --      PT SHORT TERM GOAL #5   Title --    Baseline --    Time --    Period --    Status --    Target Date --  PT Long Term Goals      PT LONG TERM GOAL #1   Title Patient will increase FOTO score to equal to or greater than  71   to demonstrate statistically significant improvement in mobility and quality of life.    Baseline 6/12: 30 7/20: 51 8/8: 65% 8/31: 64% 10/5: 67%   Time 8    Period Weeks    Status Partially Met   Target Date 05/25/2022         PT LONG TERM GOAL #2   Title Patient will report a worst pain of 3/10 on VAS in  R knee  to improve tolerance with ADLs and reduced symptoms with activities.    Baseline 6/12: 8/10 pain 7/20: 6/10 8/8: 5/10 8/31: 6/10 10/5: 5/10    Time 8    Period Weeks    Status Partially Met   Target Date 05/25/2022         PT LONG TERM GOAL #3   Title Patient will increase lower extremity functional scale to >60/80 to demonstrate improved functional mobility and increased tolerance with ADLs.    Baseline 6/12: 19/80 7/20: 45/80 8/8: 49/80 8/31 55/80 10/5: 58/80   Time 8    Period Weeks    Status Partially Met   Target Date 05/25/2022         PT LONG TERM GOAL #4   Title Patient will improve R knee ROM (neutral to 120 degrees) for return to PLOF and safe functional mobility.    Baseline 6/12: locked in extension 7/20: seated 85 supine AROM: 75    extension: AROM -5 PROM -2 8/8:  supine AROM 75 PROM 80;   seated: AROM 73 PROM 86 8/31: supine:AROM 75 PROM 84   sitting; AROM 79 PROM 92 10/5: 128 degrees   Time 8    Period Weeks    Status MET   Target Date 03/28/2022       PT LONG TERM GOAL #5   Title Patient will increase six minute walk test distance to >1000 for progression to community ambulator and improve gait ability    Baseline 6/12:  unable to test 7/20: 1340 w/o brace 8/8: 1690 ft    Time 8    Period Weeks    Status MET   Target Date 03/28/2022     PT LONG TERM GOAL #6  Title Patient will return to sport and running 5 miles without pain increase for return to PLOF   Baseline 8/8: unable to play or run 8/31: unable to 10/5: not cleared for return to sport yet  Time 8   Period Weeks   Status On going  Target Date 05/25/2022           PT LONG TERM GOAL #7  Title Patient will increase BLE gross strength to 5/5 as to improve functional strength for independent gait, increased standing tolerance and increased ADL ability.  Baseline 10/5: see above  Time 8   Period Weeks   Status New  Target Date 05/25/2022             Plan       Clinical Impression Statement Patient presents with excellent motivation to physical therapy session. Continues to progress gait training techniques with no pain increase. Functional mobility strengthening in close chain position performed with patient fatigued but tolerated well. Will progress running further next session with longer duration and higher intensity.  Patient will continue to benefit from skilled physical therapy interventions in order to  improve right knee range of motion, strength, in order to return allow return to his previous level of function.   Personal Factors and Comorbidities Profession;Transportation    Examination-Activity Limitations Bathing;Bed Mobility;Bend;Caring for Others;Carry;Dressing;Hygiene/Grooming;Lift;Locomotion Level;Toileting;Stand;Stairs;Squat;Sit;Transfers    Examination-Participation Restrictions Cleaning;Community Activity;Driving;Meal Prep;Laundry;Occupation;Shop;Yard Work;Volunteer    Stability/Clinical Decision Making Stable/Uncomplicated    Rehab Potential Good    PT Frequency 2x / week    PT Duration 8 weeks    PT Treatment/Interventions ADLs/Self Care Home Management;Cryotherapy;Electrical Stimulation;Iontophoresis 59m/ml  Dexamethasone;Moist Heat;Gait training;DME Instruction;Stair training;Functional mobility training;Therapeutic activities;Therapeutic exercise;Patient/family education;Neuromuscular re-education;Balance training;Orthotic Fit/Training;Manual techniques;Passive range of motion;Scar mobilization;Manual lymph drainage;Dry needling;Energy conservation;Splinting;Taping    PT Next Visit Plan    PT Home Exercise Plan see above    Consulted and Agree with Plan of Care Patient            MJanna ArchPT    04/20/2022, 9:20 AM

## 2022-04-20 ENCOUNTER — Ambulatory Visit: Payer: 59

## 2022-04-20 DIAGNOSIS — M25561 Pain in right knee: Secondary | ICD-10-CM | POA: Diagnosis not present

## 2022-04-20 DIAGNOSIS — M25661 Stiffness of right knee, not elsewhere classified: Secondary | ICD-10-CM

## 2022-04-20 DIAGNOSIS — R262 Difficulty in walking, not elsewhere classified: Secondary | ICD-10-CM

## 2022-04-25 ENCOUNTER — Ambulatory Visit: Payer: 59

## 2022-04-25 DIAGNOSIS — R262 Difficulty in walking, not elsewhere classified: Secondary | ICD-10-CM

## 2022-04-25 DIAGNOSIS — M25561 Pain in right knee: Secondary | ICD-10-CM

## 2022-04-25 DIAGNOSIS — M25661 Stiffness of right knee, not elsewhere classified: Secondary | ICD-10-CM

## 2022-04-25 NOTE — Therapy (Signed)
OUTPATIENT PHYSICAL THERAPY TREATMENT NOTE     Patient Name: Douglas Adams MRN: [id number] DOB:07-13-2000, 21 y.o., male Today's Date: 04/25/2022  PCP: Saddie Benders MD  REFERRING PROVIDER: Saddie Benders MD   PT End of Session - 04/25/22 0754     Visit Number 35    Number of Visits 42    Date for PT Re-Evaluation 05/25/22    Authorization Type [employer]; PN 5/10 10/10    Authorization Time Period 01/31/22-03/28/22    Progress Note Due on Visit 30    PT Start Time 0718    PT Stop Time 0759    PT Time Calculation (min) 41 min    Equipment Utilized During Treatment Other (comment);Right knee immobilizer    Activity Tolerance Patient tolerated treatment well    Behavior During Therapy [facility] for tasks assessed/performed                                  Past Medical History:  Diagnosis Date   Allergies    PONV (postoperative nausea and vomiting) 12/01/2021   pt's with extended recovery- vomiting x 3 hrs.   Past Surgical History:  Procedure Laterality Date   ADENOIDECTOMY AND MYRINGOTOMY WITH TUBE PLACEMENT Bilateral 2007   ANTERIOR CRUCIATE LIGAMENT REPAIR Right 12/01/2021   Procedure: RECONSTRUCTION ANTERIOR CRUCIATE LIGAMENT (ACL);  Surgeon: Hiram Gash, MD;  Location: [place];  Service: Orthopedics;  Laterality: Right;   KNEE ARTHROSCOPY Right 03/02/2022   Procedure: ARTHROSCOPY KNEE;  Surgeon: Hiram Gash, MD;  Location: [place];  Service: Orthopedics;  Laterality: Right;   LYSIS OF ADHESION Right 03/02/2022   Procedure: LYSIS OF ADHESION/MANIPULATION;  Surgeon: Hiram Gash, MD;  Location: [place];  Service: Orthopedics;  Laterality: Right;   There are no problems to display for this patient.   REFERRING DIAG: ACL reconstruction and arthroscopy of R knee  THERAPY DIAG:  Acute pain of right knee  Stiffness of right knee, not elsewhere classified  Difficulty in walking, not  elsewhere classified  Rationale for Evaluation and Treatment Rehabilitation  PERTINENT HISTORY: Patient is a 21 year old male s/p ACL reconstruction with meniscal repair 12/01/21. Patient was injured with a twisting injury while playing basketball where he felt a pop and was unable to bear weight. Protocol not provided however [name] has protocol from clinic: knee extension lock for one week, d/c brace by 4 weeks if quad control appropriate. Progress PROM AAROM and AROM as tolerated for 0-6 weeks; with limitation of 0-90 for first 4 weeks. Pt goes for manip under anesth on 03/02/22.   PRECAUTIONS: ACL protocol  SUBJECTIVE: Patient reports being stiff but not too painful today. Has been compliant with HEP.   PAIN:  Are you having pain? Yes: NPRS scale: 4/10 Pain location: Rt Patella, quad   Pain description: post surgical Aggravating factors: movement Relieving factors: elevating, ice, rest  INTERVENTION   Right Left  Hip flexion 4- 4+  Hip Abduction 4- 4+  Hip Adduction 4- 4+  Knee Extension  4 4+  Knee Flexion 3+ 4  DF 5 5  PF 4+ 5    TREATMENT  in wellzone: Bike: Forward/backwards cycle x 4 minutes  Return to run protocol: Walk 2 minutes warm up at 3.2  -walk 3.2 mps 1 minute; jog 6.5 mps 1 minute x 5 trials  In PT gym:  Single leg heel taps ;  eccentric heel tap from 6" step BTB abduction cueing 10x each LE; x 2 sets    Ice cup massage x 5 minutes    Trigger Point Dry Needling (TDN), unbilled Education performed with patient regarding potential benefit of TDN. Reviewed precautions and risks with patient. Reviewed special precautions/risks over lung fields which include pneumothorax. Reviewed signs and symptoms of pneumothorax and advised pt to go to ER immediately if these symptoms develop advise them of dry needling treatment. Extensive time spent with pt to ensure full understanding of TDN risks. Pt provided verbal consent to treatment. TDN performed to  with 0.3 x 60  single needle placements with local twitch response (LTR). Pistoning technique utilized. Improved pain-free motion following intervention. bilateral hamstring and gastroc x 3 minutes       PATIENT EDUCATION: Education details: exercise technique, body mechanics, protocol Person educated: Patient Education method: Explanation, Demonstration, Tactile cues, and Verbal cues Education comprehension: verbalized understanding, returned demonstration, verbal cues required, and tactile cues required   HOME EXERCISE PROGRAM: Access Code: [email or web address] URL: [email or web address] Date: 12/15/2021 Prepared by: [name]  Exercises - Long Sitting Quad Set  - 1 x daily - 7 x weekly - 2 sets - 10 reps - 5 hold - Active Straight Leg Raise with Quad Set  - 1 x daily - 7 x weekly - 2 sets - 10 reps - 5 hold - Mini Squat with Counter Support  - 1 x daily - 7 x weekly - 2 sets - 10 reps - 5 hold - Side Lunge with Counter Support  - 1 x daily - 7 x weekly - 2 sets - 10 reps - 5 hold - Seated Ankle Alphabet  - 1 x daily - 7 x weekly - 2 sets - 10 reps - 5 hold   PT Short Term Goals       PT SHORT TERM GOAL #1   Title Patient will be independent in home exercise program to improve strength/mobility for better functional independence with ADLs.    Baseline 6/12; HEP given 7/20 : HEP compliant    Time 4    Status Achieved   Target Date 01/02/22      PT SHORT TERM GOAL #2   Title Patient will report a worst pain of 5/10 on VAS in R knee to improve tolerance with ADLs and reduced symptoms with activities.    Baseline 6/12: 8/10 7/20: 6/10 8/8: 5/10    Time 4    Period Weeks    Status MET   Target Date 01/02/22      PT SHORT TERM GOAL #3   Title Patient will increase 10 meter walk test to >1.66ms with LRAD as to improve gait speed for better community ambulation and to reduce fall risk.    Baseline 6/12:0.77 with crutches 7/20: 1.6 m/s    Time 4    Period Weeks    Status Achieved    Target Date 01/02/22      PT SHORT TERM GOAL #4   Title --    Baseline --    Time --    Period --    Status --    Target Date --      PT SHORT TERM GOAL #5   Title --    Baseline --    Time --    Period --    Status --    Target Date --              PT  Long Term Goals      PT LONG TERM GOAL #1   Title Patient will increase FOTO score to equal to or greater than  71   to demonstrate statistically significant improvement in mobility and quality of life.    Baseline 6/12: 30 7/20: 51 8/8: 65% 8/31: 64% 10/5: 67%   Time 8    Period Weeks    Status Partially Met   Target Date 05/25/2022         PT LONG TERM GOAL #2   Title Patient will report a worst pain of 3/10 on VAS in  R knee  to improve tolerance with ADLs and reduced symptoms with activities.    Baseline 6/12: 8/10 pain 7/20: 6/10 8/8: 5/10 8/31: 6/10 10/5: 5/10    Time 8    Period Weeks    Status Partially Met   Target Date 05/25/2022         PT LONG TERM GOAL #3   Title Patient will increase lower extremity functional scale to >60/80 to demonstrate improved functional mobility and increased tolerance with ADLs.    Baseline 6/12: 19/80 7/20: 45/80 8/8: 49/80 8/31 55/80 10/5: 58/80   Time 8    Period Weeks    Status Partially Met   Target Date 05/25/2022         PT LONG TERM GOAL #4   Title Patient will improve R knee ROM (neutral to 120 degrees) for return to PLOF and safe functional mobility.    Baseline 6/12: locked in extension 7/20: seated 85 supine AROM: 75    extension: AROM -5 PROM -2 8/8:  supine AROM 75 PROM 80;   seated: AROM 73 PROM 86 8/31: supine:AROM 75 PROM 84   sitting; AROM 79 PROM 92 10/5: 128 degrees   Time 8    Period Weeks    Status MET   Target Date 03/28/2022       PT LONG TERM GOAL #5   Title Patient will increase six minute walk test distance to >1000 for progression to community ambulator and improve gait ability    Baseline 6/12: unable to test 7/20: 1340 w/o  brace 8/8: 1690 ft    Time 8    Period Weeks    Status MET   Target Date 03/28/2022     PT LONG TERM GOAL #6  Title Patient will return to sport and running 5 miles without pain increase for return to PLOF   Baseline 8/8: unable to play or run 8/31: unable to 10/5: not cleared for return to sport yet  Time 8   Period Weeks   Status On going  Target Date 05/25/2022           PT LONG TERM GOAL #7  Title Patient will increase BLE gross strength to 5/5 as to improve functional strength for independent gait, increased standing tolerance and increased ADL ability.  Baseline 10/5: see above  Time 8   Period Weeks   Status New  Target Date 05/25/2022             Plan       Clinical Impression Statement Patient tolerates progression of running trial tolerating one minute of jogging without pain increase. Cue for neutral foot alignment required as patient fatigued. Cueing for reduction of foot external rotation and knee adduction with single leg foot eccentric heel tap.   Patient will continue to benefit from skilled physical therapy interventions in order to improve right knee range of  motion, strength, in order to return allow return to his previous level of function.   Personal Factors and Comorbidities Profession;Transportation    Examination-Activity Limitations Bathing;Bed Mobility;Bend;Caring for Others;Carry;Dressing;Hygiene/Grooming;Lift;Locomotion Level;Toileting;Stand;Stairs;Squat;Sit;Transfers    Examination-Participation Restrictions Cleaning;Community Activity;Driving;Meal Prep;Laundry;Occupation;Shop;Yard Work;Volunteer    Stability/Clinical Decision Making Stable/Uncomplicated    Rehab Potential Good    PT Frequency 2x / week    PT Duration 8 weeks    PT Treatment/Interventions ADLs/Self Care Home Management;Cryotherapy;Electrical Stimulation;Iontophoresis 66m/ml Dexamethasone;Moist Heat;Gait training;DME Instruction;Stair training;Functional mobility  training;Therapeutic activities;Therapeutic exercise;Patient/family education;Neuromuscular re-education;Balance training;Orthotic Fit/Training;Manual techniques;Passive range of motion;Scar mobilization;Manual lymph drainage;Dry needling;Energy conservation;Splinting;Taping    PT Next Visit Plan    PT Home Exercise Plan see above    Consulted and Agree with Plan of Care Patient            MJanna ArchPT    04/25/2022, 7:59 AM

## 2022-04-26 NOTE — Therapy (Signed)
OUTPATIENT PHYSICAL THERAPY TREATMENT NOTE     Patient Name: Douglas Adams MRN: [id number] DOB:Jul 16, 2000, 21 y.o., male Today's Date: 04/27/2022  PCP: Saddie Benders MD  REFERRING PROVIDER: Saddie Benders MD   PT End of Session - 04/27/22 0746     Visit Number 36    Number of Visits 42    Date for PT Re-Evaluation 05/25/22    Authorization Type [employer]; PN 5/10 10/10    Authorization Time Period 01/31/22-03/28/22    Progress Note Due on Visit 30    PT Start Time 0715    PT Stop Time 0759    PT Time Calculation (min) 44 min    Equipment Utilized During Treatment Other (comment);Right knee immobilizer    Activity Tolerance Patient tolerated treatment well    Behavior During Therapy [facility] for tasks assessed/performed                                   Past Medical History:  Diagnosis Date   Allergies    PONV (postoperative nausea and vomiting) 12/01/2021   pt's with extended recovery- vomiting x 3 hrs.   Past Surgical History:  Procedure Laterality Date   ADENOIDECTOMY AND MYRINGOTOMY WITH TUBE PLACEMENT Bilateral 2007   ANTERIOR CRUCIATE LIGAMENT REPAIR Right 12/01/2021   Procedure: RECONSTRUCTION ANTERIOR CRUCIATE LIGAMENT (ACL);  Surgeon: Hiram Gash, MD;  Location: [place];  Service: Orthopedics;  Laterality: Right;   KNEE ARTHROSCOPY Right 03/02/2022   Procedure: ARTHROSCOPY KNEE;  Surgeon: Hiram Gash, MD;  Location: [place];  Service: Orthopedics;  Laterality: Right;   LYSIS OF ADHESION Right 03/02/2022   Procedure: LYSIS OF ADHESION/MANIPULATION;  Surgeon: Hiram Gash, MD;  Location: [place];  Service: Orthopedics;  Laterality: Right;   There are no problems to display for this patient.   REFERRING DIAG: ACL reconstruction and arthroscopy of R knee  THERAPY DIAG:  Acute pain of right knee  Stiffness of right knee, not elsewhere classified  Difficulty in walking, not  elsewhere classified  Rationale for Evaluation and Treatment Rehabilitation  PERTINENT HISTORY: Patient is a 21 year old male s/p ACL reconstruction with meniscal repair 12/01/21. Patient was injured with a twisting injury while playing basketball where he felt a pop and was unable to bear weight. Protocol not provided however [name] has protocol from clinic: knee extension lock for one week, d/c brace by 4 weeks if quad control appropriate. Progress PROM AAROM and AROM as tolerated for 0-6 weeks; with limitation of 0-90 for first 4 weeks. Pt goes for manip under anesth on 03/02/22.   PRECAUTIONS: ACL protocol  SUBJECTIVE: Patient reports feeling very stiff today. Goes to see the physician tomorrow. Has been compliant with HEP.   PAIN:  Are you having pain? Yes: NPRS scale: 4/10 Pain location: Rt Patella, quad   Pain description: post surgical Aggravating factors: movement Relieving factors: elevating, ice, rest  INTERVENTION   Right Left  Hip flexion 4- 4+  Hip Abduction 4- 4+  Hip Adduction 4- 4+  Knee Extension  4 4+  Knee Flexion 3+ 4  DF 5 5  PF 4+ 5    TREATMENT  in wellzone: Bike: Forward/backwards cycle x 4 minutes  Return to run protocol: Walk 2 minutes warm up at 3.2  -walk 3.2 mps 1 minute; jog 6.5 mps 1 minute x 5 trials  In PT gym:  Stool 45 ft crawl with 4lb ankle weights ; 2x forwards, 2x backwards.   Supine heel slides hold 10 seconds ; AROM 125   Ice cup massage x 5 minutes Progressive PROM supine 5x 20 second hold Patella mobilizations 2 minutes   Trigger Point Dry Needling (TDN), unbilled Education performed with patient regarding potential benefit of TDN. Reviewed precautions and risks with patient. Reviewed special precautions/risks over lung fields which include pneumothorax. Reviewed signs and symptoms of pneumothorax and advised pt to go to ER immediately if these symptoms develop advise them of dry needling treatment. Extensive time spent with pt  to ensure full understanding of TDN risks. Pt provided verbal consent to treatment. TDN performed to  with 0.3 x 60 single needle placements with local twitch response (LTR). Pistoning technique utilized. Improved pain-free motion following intervention. R hamstring and IT band  x2 minutes      PATIENT EDUCATION: Education details: exercise technique, body mechanics, protocol Person educated: Patient Education method: Explanation, Demonstration, Tactile cues, and Verbal cues Education comprehension: verbalized understanding, returned demonstration, verbal cues required, and tactile cues required   HOME EXERCISE PROGRAM: Access Code: [email or web address] URL: [email or web address] Date: 12/15/2021 Prepared by: [name]  Exercises - Long Sitting Quad Set  - 1 x daily - 7 x weekly - 2 sets - 10 reps - 5 hold - Active Straight Leg Raise with Quad Set  - 1 x daily - 7 x weekly - 2 sets - 10 reps - 5 hold - Mini Squat with Counter Support  - 1 x daily - 7 x weekly - 2 sets - 10 reps - 5 hold - Side Lunge with Counter Support  - 1 x daily - 7 x weekly - 2 sets - 10 reps - 5 hold - Seated Ankle Alphabet  - 1 x daily - 7 x weekly - 2 sets - 10 reps - 5 hold   PT Short Term Goals       PT SHORT TERM GOAL #1   Title Patient will be independent in home exercise program to improve strength/mobility for better functional independence with ADLs.    Baseline 6/12; HEP given 7/20 : HEP compliant    Time 4    Status Achieved   Target Date 01/02/22      PT SHORT TERM GOAL #2   Title Patient will report a worst pain of 5/10 on VAS in R knee to improve tolerance with ADLs and reduced symptoms with activities.    Baseline 6/12: 8/10 7/20: 6/10 8/8: 5/10    Time 4    Period Weeks    Status MET   Target Date 01/02/22      PT SHORT TERM GOAL #3   Title Patient will increase 10 meter walk test to >1.36ms with LRAD as to improve gait speed for better community ambulation and to reduce fall  risk.    Baseline 6/12:0.77 with crutches 7/20: 1.6 m/s    Time 4    Period Weeks    Status Achieved   Target Date 01/02/22      PT SHORT TERM GOAL #4   Title --    Baseline --    Time --    Period --    Status --    Target Date --      PT SHORT TERM GOAL #5   Title --    Baseline --    Time --    Period --    Status --  Target Date --              PT Long Term Goals      PT LONG TERM GOAL #1   Title Patient will increase FOTO score to equal to or greater than  71   to demonstrate statistically significant improvement in mobility and quality of life.    Baseline 6/12: 30 7/20: 51 8/8: 65% 8/31: 64% 10/5: 67%   Time 8    Period Weeks    Status Partially Met   Target Date 05/25/2022         PT LONG TERM GOAL #2   Title Patient will report a worst pain of 3/10 on VAS in  R knee  to improve tolerance with ADLs and reduced symptoms with activities.    Baseline 6/12: 8/10 pain 7/20: 6/10 8/8: 5/10 8/31: 6/10 10/5: 5/10    Time 8    Period Weeks    Status Partially Met   Target Date 05/25/2022         PT LONG TERM GOAL #3   Title Patient will increase lower extremity functional scale to >60/80 to demonstrate improved functional mobility and increased tolerance with ADLs.    Baseline 6/12: 19/80 7/20: 45/80 8/8: 49/80 8/31 55/80 10/5: 58/80   Time 8    Period Weeks    Status Partially Met   Target Date 05/25/2022         PT LONG TERM GOAL #4   Title Patient will improve R knee ROM (neutral to 120 degrees) for return to PLOF and safe functional mobility.    Baseline 6/12: locked in extension 7/20: seated 85 supine AROM: 75    extension: AROM -5 PROM -2 8/8:  supine AROM 75 PROM 80;   seated: AROM 73 PROM 86 8/31: supine:AROM 75 PROM 84   sitting; AROM 79 PROM 92 10/5: 128 degrees   Time 8    Period Weeks    Status MET   Target Date 03/28/2022       PT LONG TERM GOAL #5   Title Patient will increase six minute walk test distance to >1000 for  progression to community ambulator and improve gait ability    Baseline 6/12: unable to test 7/20: 1340 w/o brace 8/8: 1690 ft    Time 8    Period Weeks    Status MET   Target Date 03/28/2022     PT LONG TERM GOAL #6  Title Patient will return to sport and running 5 miles without pain increase for return to PLOF   Baseline 8/8: unable to play or run 8/31: unable to 10/5: not cleared for return to sport yet  Time 8   Period Weeks   Status On going  Target Date 05/25/2022           PT LONG TERM GOAL #7  Title Patient will increase BLE gross strength to 5/5 as to improve functional strength for independent gait, increased standing tolerance and increased ADL ability.  Baseline 10/5: see above  Time 8   Period Weeks   Status New  Target Date 05/25/2022             Plan       Clinical Impression Statement Patient to see surgeon tomorrow to determine if cleared for return to sport. Patient tolerates running protocol well with decreased pain with repetition. Fatigue in hamstring occurs first in surgical limb. Patient continues to demonstrate excellent motivation and potential.  Patient will continue  to benefit from skilled physical therapy interventions in order to improve right knee range of motion, strength, in order to return allow return to his previous level of function.   Personal Factors and Comorbidities Profession;Transportation    Examination-Activity Limitations Bathing;Bed Mobility;Bend;Caring for Others;Carry;Dressing;Hygiene/Grooming;Lift;Locomotion Level;Toileting;Stand;Stairs;Squat;Sit;Transfers    Examination-Participation Restrictions Cleaning;Community Activity;Driving;Meal Prep;Laundry;Occupation;Shop;Yard Work;Volunteer    Stability/Clinical Decision Making Stable/Uncomplicated    Rehab Potential Good    PT Frequency 2x / week    PT Duration 8 weeks    PT Treatment/Interventions ADLs/Self Care Home Management;Cryotherapy;Electrical  Stimulation;Iontophoresis 57m/ml Dexamethasone;Moist Heat;Gait training;DME Instruction;Stair training;Functional mobility training;Therapeutic activities;Therapeutic exercise;Patient/family education;Neuromuscular re-education;Balance training;Orthotic Fit/Training;Manual techniques;Passive range of motion;Scar mobilization;Manual lymph drainage;Dry needling;Energy conservation;Splinting;Taping    PT Next Visit Plan    PT Home Exercise Plan see above    Consulted and Agree with Plan of Care Patient            MJanna ArchPT    04/27/2022, 7:59 AM

## 2022-04-27 ENCOUNTER — Ambulatory Visit: Payer: 59 | Attending: Orthopaedic Surgery

## 2022-04-27 DIAGNOSIS — R262 Difficulty in walking, not elsewhere classified: Secondary | ICD-10-CM | POA: Diagnosis present

## 2022-04-27 DIAGNOSIS — M25561 Pain in right knee: Secondary | ICD-10-CM | POA: Diagnosis not present

## 2022-04-27 DIAGNOSIS — M25661 Stiffness of right knee, not elsewhere classified: Secondary | ICD-10-CM | POA: Insufficient documentation

## 2022-05-01 NOTE — Therapy (Signed)
OUTPATIENT PHYSICAL THERAPY TREATMENT NOTE     Patient Name: Douglas Adams MRN: [id number] DOB:08/25/2000, 21 y.o., male Today's Date: 05/02/2022  PCP: Saddie Benders MD  REFERRING PROVIDER: Saddie Benders MD   PT End of Session - 05/02/22 0714     Visit Number 37    Number of Visits 42    Date for PT Re-Evaluation 05/25/22    Authorization Type [employer]; PN 7/10 10/10    Authorization Time Period 01/31/22-03/28/22    Progress Note Due on Visit 30    PT Start Time 0715    PT Stop Time 0759    PT Time Calculation (min) 44 min    Equipment Utilized During Treatment Other (comment);Right knee immobilizer    Activity Tolerance Patient tolerated treatment well    Behavior During Therapy [facility] for tasks assessed/performed                                    Past Medical History:  Diagnosis Date   Allergies    PONV (postoperative nausea and vomiting) 12/01/2021   pt's with extended recovery- vomiting x 3 hrs.   Past Surgical History:  Procedure Laterality Date   ADENOIDECTOMY AND MYRINGOTOMY WITH TUBE PLACEMENT Bilateral 2007   ANTERIOR CRUCIATE LIGAMENT REPAIR Right 12/01/2021   Procedure: RECONSTRUCTION ANTERIOR CRUCIATE LIGAMENT (ACL);  Surgeon: Hiram Gash, MD;  Location: [place];  Service: Orthopedics;  Laterality: Right;   KNEE ARTHROSCOPY Right 03/02/2022   Procedure: ARTHROSCOPY KNEE;  Surgeon: Hiram Gash, MD;  Location: [place];  Service: Orthopedics;  Laterality: Right;   LYSIS OF ADHESION Right 03/02/2022   Procedure: LYSIS OF ADHESION/MANIPULATION;  Surgeon: Hiram Gash, MD;  Location: [place];  Service: Orthopedics;  Laterality: Right;   There are no problems to display for this patient.   REFERRING DIAG: ACL reconstruction and arthroscopy of R knee  THERAPY DIAG:  Acute pain of right knee  Stiffness of right knee, not elsewhere classified  Difficulty in walking, not  elsewhere classified  Rationale for Evaluation and Treatment Rehabilitation  PERTINENT HISTORY: Patient is a 21 year old male s/p ACL reconstruction with meniscal repair 12/01/21. Patient was injured with a twisting injury while playing basketball where he felt a pop and was unable to bear weight. Protocol not provided however [name] has protocol from clinic: knee extension lock for one week, d/c brace by 4 weeks if quad control appropriate. Progress PROM AAROM and AROM as tolerated for 0-6 weeks; with limitation of 0-90 for first 4 weeks. Pt goes for manip under anesth on 03/02/22.   PRECAUTIONS: ACL protocol  SUBJECTIVE:  Patient presents to therapy following a corticosteroid injection to their lateral (R) knee. Patient reports they were experiencing their worst pain in the last few weeks on Sunday night and was at a 7/10 on the NPRS. Patient reports pain on the medial aspect of their knee that feels like a lot of pressure and reports its can radiate to the lateral aspect of the knee. Patient reports pain today at it's worst a 5/10 on the NPRS and states it comes and goes.   PAIN:  Are you having pain? Yes: NPRS scale: 5/10 Pain location: Rt Patella, quad   Pain description: post surgical Aggravating factors: movement Relieving factors: elevating, ice, rest  INTERVENTION   Right Left  Hip flexion 4- 4+  Hip Abduction 4-  4+  Hip Adduction 4- 4+  Knee Extension  4 4+  Knee Flexion 3+ 4  DF 5 5  PF 4+ 5    TREATMENT  Stairs 3x (Checking for changes in pain following  Quad Sets Supine: 15x  (Patient required cueing to prevent glute activation and focus solely on quad activation)  Quad contract and SLR 15x  Bridge 15x; hold 5 seconds at the top  Progressive knee flexion with pain at full end range flexion in lateral hamstring and medial quad)  Hamstring stretch with leg on PT shoulder 60 seconds  Ice cup massage 6 minutes Patella mobilizations x 3 minutes    Trigger Point Dry  Needling (TDN), unbilled Education performed with patient regarding potential benefit of TDN. Reviewed precautions and risks with patient. Reviewed special precautions/risks over lung fields which include pneumothorax. Reviewed signs and symptoms of pneumothorax and advised pt to go to ER immediately if these symptoms develop advise them of dry needling treatment. Extensive time spent with pt to ensure full understanding of TDN risks. Pt provided verbal consent to treatment. TDN performed to  with 0.3 x 60 single needle placements with local twitch response (LTR). Pistoning technique utilized. Improved pain-free motion following intervention. R hamstring, R quad, R anterior tib and quad  x4 minutes      PATIENT EDUCATION: Education details: exercise technique, body mechanics, protocol Person educated: Patient Education method: Explanation, Demonstration, Tactile cues, and Verbal cues Education comprehension: verbalized understanding, returned demonstration, verbal cues required, and tactile cues required   HOME EXERCISE PROGRAM: Access Code: [email or web address] URL: [email or web address] Date: 12/15/2021 Prepared by: [name]  Exercises - Long Sitting Quad Set  - 1 x daily - 7 x weekly - 2 sets - 10 reps - 5 hold - Active Straight Leg Raise with Quad Set  - 1 x daily - 7 x weekly - 2 sets - 10 reps - 5 hold - Mini Squat with Counter Support  - 1 x daily - 7 x weekly - 2 sets - 10 reps - 5 hold - Side Lunge with Counter Support  - 1 x daily - 7 x weekly - 2 sets - 10 reps - 5 hold - Seated Ankle Alphabet  - 1 x daily - 7 x weekly - 2 sets - 10 reps - 5 hold   PT Short Term Goals       PT SHORT TERM GOAL #1   Title Patient will be independent in home exercise program to improve strength/mobility for better functional independence with ADLs.    Baseline 6/12; HEP given 7/20 : HEP compliant    Time 4    Status Achieved   Target Date 01/02/22      PT SHORT TERM GOAL #2   Title  Patient will report a worst pain of 5/10 on VAS in R knee to improve tolerance with ADLs and reduced symptoms with activities.    Baseline 6/12: 8/10 7/20: 6/10 8/8: 5/10    Time 4    Period Weeks    Status MET   Target Date 01/02/22      PT SHORT TERM GOAL #3   Title Patient will increase 10 meter walk test to >1.28ms with LRAD as to improve gait speed for better community ambulation and to reduce fall risk.    Baseline 6/12:0.77 with crutches 7/20: 1.6 m/s    Time 4    Period Weeks    Status Achieved   Target Date 01/02/22  PT SHORT TERM GOAL #4   Title --    Baseline --    Time --    Period --    Status --    Target Date --      PT SHORT TERM GOAL #5   Title --    Baseline --    Time --    Period --    Status --    Target Date --              PT Long Term Goals      PT LONG TERM GOAL #1   Title Patient will increase FOTO score to equal to or greater than  71   to demonstrate statistically significant improvement in mobility and quality of life.    Baseline 6/12: 30 7/20: 51 8/8: 65% 8/31: 64% 10/5: 67%   Time 8    Period Weeks    Status Partially Met   Target Date 05/25/2022         PT LONG TERM GOAL #2   Title Patient will report a worst pain of 3/10 on VAS in  R knee  to improve tolerance with ADLs and reduced symptoms with activities.    Baseline 6/12: 8/10 pain 7/20: 6/10 8/8: 5/10 8/31: 6/10 10/5: 5/10    Time 8    Period Weeks    Status Partially Met   Target Date 05/25/2022         PT LONG TERM GOAL #3   Title Patient will increase lower extremity functional scale to >60/80 to demonstrate improved functional mobility and increased tolerance with ADLs.    Baseline 6/12: 19/80 7/20: 45/80 8/8: 49/80 8/31 55/80 10/5: 58/80   Time 8    Period Weeks    Status Partially Met   Target Date 05/25/2022         PT LONG TERM GOAL #4   Title Patient will improve R knee ROM (neutral to 120 degrees) for return to PLOF and safe functional  mobility.    Baseline 6/12: locked in extension 7/20: seated 85 supine AROM: 75    extension: AROM -5 PROM -2 8/8:  supine AROM 75 PROM 80;   seated: AROM 73 PROM 86 8/31: supine:AROM 75 PROM 84   sitting; AROM 79 PROM 92 10/5: 128 degrees   Time 8    Period Weeks    Status MET   Target Date 03/28/2022       PT LONG TERM GOAL #5   Title Patient will increase six minute walk test distance to >1000 for progression to community ambulator and improve gait ability    Baseline 6/12: unable to test 7/20: 1340 w/o brace 8/8: 1690 ft    Time 8    Period Weeks    Status MET   Target Date 03/28/2022     PT LONG TERM GOAL #6  Title Patient will return to sport and running 5 miles without pain increase for return to PLOF   Baseline 8/8: unable to play or run 8/31: unable to 10/5: not cleared for return to sport yet  Time 8   Period Weeks   Status On going  Target Date 05/25/2022           PT LONG TERM GOAL #7  Title Patient will increase BLE gross strength to 5/5 as to improve functional strength for independent gait, increased standing tolerance and increased ADL ability.  Baseline 10/5: see above  Time 8   Period Weeks  Status New  Target Date 05/25/2022             Plan       Clinical Impression Statement The patient presented to skilled therapy with some aggravation in their (R) knee. During palpation tenderness was noted around the medial head of the tibia.The patient was reassessed to screen out any re injury. Negative tests were found for the Lachman test and mcmurry test. Pain in hamstring reduced with TDN however tibia pain continues to be present. Patient agreeable to contact physician.   Patient will continue to benefit from skilled physical therapy interventions in order to improve right knee range of motion, strength, in order to return allow return to his previous level of function.   Personal Factors and Comorbidities Profession;Transportation     Examination-Activity Limitations Bathing;Bed Mobility;Bend;Caring for Others;Carry;Dressing;Hygiene/Grooming;Lift;Locomotion Level;Toileting;Stand;Stairs;Squat;Sit;Transfers    Examination-Participation Restrictions Cleaning;Community Activity;Driving;Meal Prep;Laundry;Occupation;Shop;Yard Work;Volunteer    Stability/Clinical Decision Making Stable/Uncomplicated    Rehab Potential Good    PT Frequency 2x / week    PT Duration 8 weeks    PT Treatment/Interventions ADLs/Self Care Home Management;Cryotherapy;Electrical Stimulation;Iontophoresis 36m/ml Dexamethasone;Moist Heat;Gait training;DME Instruction;Stair training;Functional mobility training;Therapeutic activities;Therapeutic exercise;Patient/family education;Neuromuscular re-education;Balance training;Orthotic Fit/Training;Manual techniques;Passive range of motion;Scar mobilization;Manual lymph drainage;Dry needling;Energy conservation;Splinting;Taping    PT Next Visit Plan    PT Home Exercise Plan see above    Consulted and Agree with Plan of Care Patient            MJanna ArchPT    05/02/2022, 8:02 AM

## 2022-05-02 ENCOUNTER — Ambulatory Visit: Payer: 59

## 2022-05-02 DIAGNOSIS — M25661 Stiffness of right knee, not elsewhere classified: Secondary | ICD-10-CM

## 2022-05-02 DIAGNOSIS — R262 Difficulty in walking, not elsewhere classified: Secondary | ICD-10-CM

## 2022-05-02 DIAGNOSIS — M25561 Pain in right knee: Secondary | ICD-10-CM

## 2022-05-04 ENCOUNTER — Ambulatory Visit: Payer: 59

## 2022-05-09 ENCOUNTER — Ambulatory Visit: Payer: 59

## 2022-05-09 DIAGNOSIS — R262 Difficulty in walking, not elsewhere classified: Secondary | ICD-10-CM

## 2022-05-09 DIAGNOSIS — M25561 Pain in right knee: Secondary | ICD-10-CM

## 2022-05-09 DIAGNOSIS — M25661 Stiffness of right knee, not elsewhere classified: Secondary | ICD-10-CM

## 2022-05-09 NOTE — Therapy (Signed)
OUTPATIENT PHYSICAL THERAPY TREATMENT NOTE     Patient Name: Douglas Adams MRN: [id number] DOB:Feb 21, 2001, 21 y.o., male Today's Date: 05/09/2022  PCP: Saddie Benders MD  REFERRING PROVIDER: Saddie Benders MD   PT End of Session - 05/09/22 0803     Visit Number 38    Number of Visits 42    Date for PT Re-Evaluation 05/25/22    Authorization Type [employer]; PN 7/10 10/10    Authorization Time Period 01/31/22-03/28/22    Progress Note Due on Visit 30    PT Start Time 0715    PT Stop Time 0759    PT Time Calculation (min) 44 min    Equipment Utilized During Treatment Other (comment);Right knee immobilizer    Activity Tolerance Patient tolerated treatment well    Behavior During Therapy [facility] for tasks assessed/performed                                     Past Medical History:  Diagnosis Date   Allergies    PONV (postoperative nausea and vomiting) 12/01/2021   pt's with extended recovery- vomiting x 3 hrs.   Past Surgical History:  Procedure Laterality Date   ADENOIDECTOMY AND MYRINGOTOMY WITH TUBE PLACEMENT Bilateral 2007   ANTERIOR CRUCIATE LIGAMENT REPAIR Right 12/01/2021   Procedure: RECONSTRUCTION ANTERIOR CRUCIATE LIGAMENT (ACL);  Surgeon: Hiram Gash, MD;  Location: [place];  Service: Orthopedics;  Laterality: Right;   KNEE ARTHROSCOPY Right 03/02/2022   Procedure: ARTHROSCOPY KNEE;  Surgeon: Hiram Gash, MD;  Location: [place];  Service: Orthopedics;  Laterality: Right;   LYSIS OF ADHESION Right 03/02/2022   Procedure: LYSIS OF ADHESION/MANIPULATION;  Surgeon: Hiram Gash, MD;  Location: [place];  Service: Orthopedics;  Laterality: Right;   There are no problems to display for this patient.   REFERRING DIAG: ACL reconstruction and arthroscopy of R knee  THERAPY DIAG:  Acute pain of right knee  Stiffness of right knee, not elsewhere classified  Difficulty in walking, not  elsewhere classified  Rationale for Evaluation and Treatment Rehabilitation  PERTINENT HISTORY: Patient is a 21 year old male s/p ACL reconstruction with meniscal repair 12/01/21. Patient was injured with a twisting injury while playing basketball where he felt a pop and was unable to bear weight. Protocol not provided however [name] has protocol from clinic: knee extension lock for one week, d/c brace by 4 weeks if quad control appropriate. Progress PROM AAROM and AROM as tolerated for 0-6 weeks; with limitation of 0-90 for first 4 weeks. Pt goes for manip under anesth on 03/02/22.   PRECAUTIONS: ACL protocol  SUBJECTIVE:  Patient has returned without pain, able to perform return to sport.   PAIN:  Are you having pain? Yes: NPRS scale: 5/10 Pain location: Rt Patella, quad   Pain description: post surgical Aggravating factors: movement Relieving factors: elevating, ice, rest  INTERVENTION   Right Left  Hip flexion 4- 4+  Hip Abduction 4- 4+  Hip Adduction 4- 4+  Knee Extension  4 4+  Knee Flexion 3+ 4  DF 5 5  PF 4+ 5    TREATMENT  in [place]: Bike: Forward/backwards cycle x 3 minutes   Return to run protocol: Walk 2 minutes warm up at 3.2  -walk 4 mps 1 minute; jog 6.5 mps 1 minute x 4 trials  In open gym:  Two cones:  -  squat 10x, partial sprint 20 ft; squat 10x, backwards run 20 ft; squat again 10x; x4 trials: x2 sets -mini jump to wall touch 15x; zercher squat with unweighted bar 10x ; x 3 sets -SLS with TRX 10x each LE   Hamstring lengthening stretch on chair 30 seconds x 2 each LE Hip flexor lengthening stretch on chair 30 seconds x 2 trials each LE  Bridge off table for full arc of motion15x;   Ice cup massage 6 minutes      PATIENT EDUCATION: Education details: exercise technique, body mechanics, protocol Person educated: Patient Education method: Explanation, Demonstration, Tactile cues, and Verbal cues Education comprehension: verbalized  understanding, returned demonstration, verbal cues required, and tactile cues required   HOME EXERCISE PROGRAM: Access Code: [email or web address] URL: [email or web address] Date: 12/15/2021 Prepared by: [name]  Exercises - Long Sitting Quad Set  - 1 x daily - 7 x weekly - 2 sets - 10 reps - 5 hold - Active Straight Leg Raise with Quad Set  - 1 x daily - 7 x weekly - 2 sets - 10 reps - 5 hold - Mini Squat with Counter Support  - 1 x daily - 7 x weekly - 2 sets - 10 reps - 5 hold - Side Lunge with Counter Support  - 1 x daily - 7 x weekly - 2 sets - 10 reps - 5 hold - Seated Ankle Alphabet  - 1 x daily - 7 x weekly - 2 sets - 10 reps - 5 hold   PT Short Term Goals       PT SHORT TERM GOAL #1   Title Patient will be independent in home exercise program to improve strength/mobility for better functional independence with ADLs.    Baseline 6/12; HEP given 7/20 : HEP compliant    Time 4    Status Achieved   Target Date 01/02/22      PT SHORT TERM GOAL #2   Title Patient will report a worst pain of 5/10 on VAS in R knee to improve tolerance with ADLs and reduced symptoms with activities.    Baseline 6/12: 8/10 7/20: 6/10 8/8: 5/10    Time 4    Period Weeks    Status MET   Target Date 01/02/22      PT SHORT TERM GOAL #3   Title Patient will increase 10 meter walk test to >1.54ms with LRAD as to improve gait speed for better community ambulation and to reduce fall risk.    Baseline 6/12:0.77 with crutches 7/20: 1.6 m/s    Time 4    Period Weeks    Status Achieved   Target Date 01/02/22      PT SHORT TERM GOAL #4   Title --    Baseline --    Time --    Period --    Status --    Target Date --      PT SHORT TERM GOAL #5   Title --    Baseline --    Time --    Period --    Status --    Target Date --              PT Long Term Goals      PT LONG TERM GOAL #1   Title Patient will increase FOTO score to equal to or greater than  71   to demonstrate  statistically significant improvement in mobility and quality of life.    Baseline 6/12: 30  7/20: 51 8/8: 65% 8/31: 64% 10/5: 67%   Time 8    Period Weeks    Status Partially Met   Target Date 05/25/2022         PT LONG TERM GOAL #2   Title Patient will report a worst pain of 3/10 on VAS in  R knee  to improve tolerance with ADLs and reduced symptoms with activities.    Baseline 6/12: 8/10 pain 7/20: 6/10 8/8: 5/10 8/31: 6/10 10/5: 5/10    Time 8    Period Weeks    Status Partially Met   Target Date 05/25/2022         PT LONG TERM GOAL #3   Title Patient will increase lower extremity functional scale to >60/80 to demonstrate improved functional mobility and increased tolerance with ADLs.    Baseline 6/12: 19/80 7/20: 45/80 8/8: 49/80 8/31 55/80 10/5: 58/80   Time 8    Period Weeks    Status Partially Met   Target Date 05/25/2022         PT LONG TERM GOAL #4   Title Patient will improve R knee ROM (neutral to 120 degrees) for return to PLOF and safe functional mobility.    Baseline 6/12: locked in extension 7/20: seated 85 supine AROM: 75    extension: AROM -5 PROM -2 8/8:  supine AROM 75 PROM 80;   seated: AROM 73 PROM 86 8/31: supine:AROM 75 PROM 84   sitting; AROM 79 PROM 92 10/5: 128 degrees   Time 8    Period Weeks    Status MET   Target Date 03/28/2022       PT LONG TERM GOAL #5   Title Patient will increase six minute walk test distance to >1000 for progression to community ambulator and improve gait ability    Baseline 6/12: unable to test 7/20: 1340 w/o brace 8/8: 1690 ft    Time 8    Period Weeks    Status MET   Target Date 03/28/2022     PT LONG TERM GOAL #6  Title Patient will return to sport and running 5 miles without pain increase for return to PLOF   Baseline 8/8: unable to play or run 8/31: unable to 10/5: not cleared for return to sport yet  Time 8   Period Weeks   Status On going  Target Date 05/25/2022           PT LONG TERM GOAL  #7  Title Patient will increase BLE gross strength to 5/5 as to improve functional strength for independent gait, increased standing tolerance and increased ADL ability.  Baseline 10/5: see above  Time 8   Period Weeks   Status New  Target Date 05/25/2022             Plan       Clinical Impression Statement Patient presents with excellent motivation. He is able to tolerate return to sport interventions. Patient is able to perform modified sprint interventions without pain; just weakness. Jumping is performed equal.   Patient will continue to benefit from skilled physical therapy interventions in order to improve right knee range of motion, strength, in order to return allow return to his previous level of function.   Personal Factors and Comorbidities Profession;Transportation    Examination-Activity Limitations Bathing;Bed Mobility;Bend;Caring for Others;Carry;Dressing;Hygiene/Grooming;Lift;Locomotion Level;Toileting;Stand;Stairs;Squat;Sit;Transfers    Examination-Participation Restrictions Cleaning;Community Activity;Driving;Meal Prep;Laundry;Occupation;Shop;Yard Work;Volunteer    Stability/Clinical Decision Making Stable/Uncomplicated    Rehab Potential Good    PT Frequency  2x / week    PT Duration 8 weeks    PT Treatment/Interventions ADLs/Self Care Home Management;Cryotherapy;Electrical Stimulation;Iontophoresis 41m/ml Dexamethasone;Moist Heat;Gait training;DME Instruction;Stair training;Functional mobility training;Therapeutic activities;Therapeutic exercise;Patient/family education;Neuromuscular re-education;Balance training;Orthotic Fit/Training;Manual techniques;Passive range of motion;Scar mobilization;Manual lymph drainage;Dry needling;Energy conservation;Splinting;Taping    PT Next Visit Plan    PT Home Exercise Plan see above    Consulted and Agree with Plan of Care Patient            MJanna ArchPT    05/09/2022, 8:20 AM

## 2022-05-11 ENCOUNTER — Ambulatory Visit: Payer: 59

## 2022-05-16 ENCOUNTER — Ambulatory Visit: Payer: 59

## 2022-05-16 DIAGNOSIS — R262 Difficulty in walking, not elsewhere classified: Secondary | ICD-10-CM

## 2022-05-16 DIAGNOSIS — M25661 Stiffness of right knee, not elsewhere classified: Secondary | ICD-10-CM

## 2022-05-16 DIAGNOSIS — M25561 Pain in right knee: Secondary | ICD-10-CM | POA: Diagnosis not present

## 2022-05-16 NOTE — Therapy (Signed)
OUTPATIENT PHYSICAL THERAPY TREATMENT NOTE  Patient Name: Douglas Adams MRN: [id number] DOB:[birth year], 21 y.o., male Today's Date: 05/16/2022  PCP: Saddie Benders MD  REFERRING PROVIDER: Saddie Benders MD  Past Medical History:  Diagnosis Date   Allergies    PONV (postoperative nausea and vomiting) 12/01/2021   pt's with extended recovery- vomiting x 3 hrs.   Past Surgical History:  Procedure Laterality Date   ADENOIDECTOMY AND MYRINGOTOMY WITH TUBE PLACEMENT Bilateral 2007   ANTERIOR CRUCIATE LIGAMENT REPAIR Right 12/01/2021   Procedure: RECONSTRUCTION ANTERIOR CRUCIATE LIGAMENT (ACL);  Surgeon: Hiram Gash, MD;  Location: [place];  Service: Orthopedics;  Laterality: Right;   KNEE ARTHROSCOPY Right 03/02/2022   Procedure: ARTHROSCOPY KNEE;  Surgeon: Hiram Gash, MD;  Location: [place];  Service: Orthopedics;  Laterality: Right;   LYSIS OF ADHESION Right 03/02/2022   Procedure: LYSIS OF ADHESION/MANIPULATION;  Surgeon: Hiram Gash, MD;  Location: [place];  Service: Orthopedics;  Laterality: Right;   There are no problems to display for this patient.   REFERRING DIAG: ACL reconstruction and arthroscopy of R knee  THERAPY DIAG:  No diagnosis found.  Rationale for Evaluation and Treatment Rehabilitation  PERTINENT HISTORY: Patient is a 21 year old male s/p ACL reconstruction with meniscal repair 12/01/21. Patient was injured with a twisting injury while playing basketball where he felt a pop and was unable to bear weight. Protocol not provided however [name] has protocol from clinic: knee extension lock for one week, d/c brace by 4 weeks if quad control appropriate. Progress PROM AAROM and AROM as tolerated for 0-6 weeks; with limitation of 0-90 for first 4 weeks. Pt goes for manip under anesth on 03/02/22.   PRECAUTIONS: ACL protocol  SUBJECTIVE:  Patient reports they are no experiencing any pain today but is experiencing some  increased tension from sitting in a car for a prolonged period as they were away this weekend traveling. Patient does report some soreness following last session but doesn't report experiencing any pain following.   PAIN:  Are you having pain? Yes: NPRS scale: 5/10 Pain location: Rt Patella, quad   Pain description: post surgical Aggravating factors: movement Relieving factors: elevating, ice, rest  INTERVENTION   Right Left  Hip flexion 4- 4+  Hip Abduction 4- 4+  Hip Adduction 4- 4+  Knee Extension  4 4+  Knee Flexion 3+ 4  DF 5 5  PF 4+ 5   TREATMENT in well zone:  Bike: - Forward/backwards cycle x 3 minutes  Return to run protocol: - Walk 1 minutes warm-up at 3.2 - Walk 4 mps 1 minute; jog 6.5 mps 1 minute x 3 trials - Cool down walk at 3 mps 1 minute   *Patient reported some pain in their right anterior knee walking at 47ms, speed was reduced for the last walk.   In open gym: Plyometrics (Small amplitude jumps, focus on control)  - Split Squat Jump (B) 10x  - Alternating box split jumps 10x  - Bilateral squat jump 10x2 - Skater Jumps 10x2  (Patient required cues to prevent increase knee valgus on descent and to take extended time to control descent and reset before next jump)   Four cones in a box: - Run up, side shuffle, back peddle, side shuffle 3x ea - Run up, side shuffle, back peddle, side shuffle while passing basketball at every cone 3x ea - Run up, diagonal shuffle, side shuffle, diagonal shuffle 3x ea  -  Run up, diagonal shuffle, side shuffle, diagonal shuffle while passing basketball at every cone 3x ea   Ice cup massage 6 minutes  Trigger Point Dry Needling (TDN), unbilled Education performed with patient regarding potential benefit of TDN. Reviewed precautions and risks with patient. Reviewed special precautions/risks over lung fields which include pneumothorax. Reviewed signs and symptoms of pneumothorax and advised pt to go to ER immediately if  these symptoms develop advise them of dry needling treatment. Extensive time spent with pt to ensure full understanding of TDN risks. Pt provided verbal consent to treatment. TDN performed to  with 0.25 x 40 single needle placements with local twitch response (LTR). Pistoning technique utilized. Improved pain-free motion following intervention. R hamstring x 2 minutes, R quad x 2 minutes.   PATIENT EDUCATION: Education details: exercise technique, body mechanics, protocol Person educated: Patient Education method: Explanation, Demonstration, Tactile cues, and Verbal cues Education comprehension: verbalized understanding, returned demonstration, verbal cues required, and tactile cues required  HOME EXERCISE PROGRAM: Access Code: [email or web address] URL: [email or web address] Date: 12/15/2021 Prepared by: [name]  Exercises - Long Sitting Quad Set  - 1 x daily - 7 x weekly - 2 sets - 10 reps - 5 hold - Active Straight Leg Raise with Quad Set  - 1 x daily - 7 x weekly - 2 sets - 10 reps - 5 hold - Mini Squat with Counter Support  - 1 x daily - 7 x weekly - 2 sets - 10 reps - 5 hold - Side Lunge with Counter Support  - 1 x daily - 7 x weekly - 2 sets - 10 reps - 5 hold - Seated Ankle Alphabet  - 1 x daily - 7 x weekly - 2 sets - 10 reps - 5 hold   PT Short Term Goals       PT SHORT TERM GOAL #1   Title Patient will be independent in home exercise program to improve strength/mobility for better functional independence with ADLs.    Baseline 6/12; HEP given 7/20 : HEP compliant    Time 4    Status Achieved   Target Date 01/02/22      PT SHORT TERM GOAL #2   Title Patient will report a worst pain of 5/10 on VAS in R knee to improve tolerance with ADLs and reduced symptoms with activities.    Baseline 6/12: 8/10 7/20: 6/10 8/8: 5/10    Time 4    Period Weeks    Status MET   Target Date 01/02/22      PT SHORT TERM GOAL #3   Title Patient will increase 10 meter walk test to  >1.83ms with LRAD as to improve gait speed for better community ambulation and to reduce fall risk.    Baseline 6/12:0.77 with crutches 7/20: 1.6 m/s    Time 4    Period Weeks    Status Achieved   Target Date 01/02/22      PT SHORT TERM GOAL #4   Title --    Baseline --    Time --    Period --    Status --    Target Date --      PT SHORT TERM GOAL #5   Title --    Baseline --    Time --    Period --    Status --    Target Date --              PT Long Term Goals  PT LONG TERM GOAL #1   Title Patient will increase FOTO score to equal to or greater than  71   to demonstrate statistically significant improvement in mobility and quality of life.    Baseline 6/12: 30 7/20: 51 8/8: 65% 8/31: 64% 10/5: 67%   Time 8    Period Weeks    Status Partially Met   Target Date 05/25/2022         PT LONG TERM GOAL #2   Title Patient will report a worst pain of 3/10 on VAS in  R knee  to improve tolerance with ADLs and reduced symptoms with activities.    Baseline 6/12: 8/10 pain 7/20: 6/10 8/8: 5/10 8/31: 6/10 10/5: 5/10    Time 8    Period Weeks    Status Partially Met   Target Date 05/25/2022         PT LONG TERM GOAL #3   Title Patient will increase lower extremity functional scale to >60/80 to demonstrate improved functional mobility and increased tolerance with ADLs.    Baseline 6/12: 19/80 7/20: 45/80 8/8: 49/80 8/31 55/80 10/5: 58/80   Time 8    Period Weeks    Status Partially Met   Target Date 05/25/2022         PT LONG TERM GOAL #4   Title Patient will improve R knee ROM (neutral to 120 degrees) for return to PLOF and safe functional mobility.    Baseline 6/12: locked in extension 7/20: seated 85 supine AROM: 75    extension: AROM -5 PROM -2 8/8:  supine AROM 75 PROM 80;   seated: AROM 73 PROM 86 8/31: supine:AROM 75 PROM 84   sitting; AROM 79 PROM 92 10/5: 128 degrees   Time 8    Period Weeks    Status MET   Target Date 03/28/2022       PT  LONG TERM GOAL #5   Title Patient will increase six minute walk test distance to >1000 for progression to community ambulator and improve gait ability    Baseline 6/12: unable to test 7/20: 1340 w/o brace 8/8: 1690 ft    Time 8    Period Weeks    Status MET   Target Date 03/28/2022     PT LONG TERM GOAL #6  Title Patient will return to sport and running 5 miles without pain increase for return to PLOF   Baseline 8/8: unable to play or run 8/31: unable to 10/5: not cleared for return to sport yet  Time 8   Period Weeks   Status On going  Target Date 05/25/2022           PT LONG TERM GOAL #7  Title Patient will increase BLE gross strength to 5/5 as to improve functional strength for independent gait, increased standing tolerance and increased ADL ability.  Baseline 10/5: see above  Time 8   Period Weeks   Status New  Target Date 05/25/2022             Plan       Clinical Impression Statement Patient presents with some increased tension in their (R) hamstring and quads. Patient continues to present with excellent motivation. The patient was able continue in his return to sport progression with minimal increase in pain. Patient was able to perform cutting, shuffling, back peddling, and light plymometric work. Patient will continue to benefit from skilled physical therapy interventions in order to improve right knee range of motion, strength,  in order to return allow return to his previous level of function.   Personal Factors and Comorbidities Profession;Transportation    Examination-Activity Limitations Bathing;Bed Mobility;Bend;Caring for Others;Carry;Dressing;Hygiene/Grooming;Lift;Locomotion Level;Toileting;Stand;Stairs;Squat;Sit;Transfers    Examination-Participation Restrictions Cleaning;Community Activity;Driving;Meal Prep;Laundry;Occupation;Shop;Yard Work;Volunteer    Stability/Clinical Decision Making Stable/Uncomplicated    Rehab Potential Good    PT Frequency 2x  / week    PT Duration 8 weeks    PT Treatment/Interventions ADLs/Self Care Home Management;Cryotherapy;Electrical Stimulation;Iontophoresis 72m/ml Dexamethasone;Moist Heat;Gait training;DME Instruction;Stair training;Functional mobility training;Therapeutic activities;Therapeutic exercise;Patient/family education;Neuromuscular re-education;Balance training;Orthotic Fit/Training;Manual techniques;Passive range of motion;Scar mobilization;Manual lymph drainage;Dry needling;Energy conservation;Splinting;Taping    PT Next Visit Plan    PT Home Exercise Plan see above    Consulted and Agree with Plan of Care Patient            PSudie BaileySPT   This entire session was performed under direct supervision and direction of a licensed therapist/therapist assistant . I have personally read, edited and approve of the note as written.  MJanna ArchPT  05/16/2022, 6:48 AM

## 2022-05-23 ENCOUNTER — Ambulatory Visit: Payer: 59

## 2022-05-23 DIAGNOSIS — M25561 Pain in right knee: Secondary | ICD-10-CM | POA: Diagnosis not present

## 2022-05-23 DIAGNOSIS — M25661 Stiffness of right knee, not elsewhere classified: Secondary | ICD-10-CM

## 2022-05-23 DIAGNOSIS — R262 Difficulty in walking, not elsewhere classified: Secondary | ICD-10-CM

## 2022-05-23 NOTE — Therapy (Signed)
OUTPATIENT PHYSICAL THERAPY TREATMENT NOTE / RECERT / Physical Therapy Progress Note   Dates of reporting period  04/04/2022   to   05/23/2022  Patient Name: Douglas Adams MRN: [id number] DOB:24-Feb-2001, 21 y.o., male Today's Date: 05/23/2022  PCP: Saddie Benders MD  REFERRING PROVIDER: Saddie Benders MD   PT End of Session - 05/23/22 0720     Visit Number 40    Number of Visits 72    Date for PT Re-Evaluation 07/18/21    Authorization Type [employer] Employee; PN 11/28    Authorization Time Period 01/31/22-03/28/22    Progress Note Due on Visit 30    PT Start Time 0715    PT Stop Time 0800    PT Time Calculation (min) 45 min    Equipment Utilized During Treatment Other (comment);Right knee immobilizer    Activity Tolerance Patient tolerated treatment well    Behavior During Therapy [facility] for tasks assessed/performed             Past Medical History:  Diagnosis Date   Allergies    PONV (postoperative nausea and vomiting) 12/01/2021   pt's with extended recovery- vomiting x 3 hrs.   Past Surgical History:  Procedure Laterality Date   ADENOIDECTOMY AND MYRINGOTOMY WITH TUBE PLACEMENT Bilateral 2007   ANTERIOR CRUCIATE LIGAMENT REPAIR Right 12/01/2021   Procedure: RECONSTRUCTION ANTERIOR CRUCIATE LIGAMENT (ACL);  Surgeon: Hiram Gash, MD;  Location: [place];  Service: Orthopedics;  Laterality: Right;   KNEE ARTHROSCOPY Right 03/02/2022   Procedure: ARTHROSCOPY KNEE;  Surgeon: Hiram Gash, MD;  Location: [place];  Service: Orthopedics;  Laterality: Right;   LYSIS OF ADHESION Right 03/02/2022   Procedure: LYSIS OF ADHESION/MANIPULATION;  Surgeon: Hiram Gash, MD;  Location: [place];  Service: Orthopedics;  Laterality: Right;   There are no problems to display for this patient.  REFERRING DIAG: ACL reconstruction and arthroscopy of R knee  THERAPY DIAG:  Stiffness of right knee, not elsewhere classified  Difficulty  in walking, not elsewhere classified  Acute pain of right knee  Rationale for Evaluation and Treatment Rehabilitation  PERTINENT HISTORY: Patient is a 21 year old male s/p ACL reconstruction with meniscal repair 12/01/21. Patient was injured with a twisting injury while playing basketball where he felt a pop and was unable to bear weight. Protocol not provided however [name] has protocol from clinic: knee extension lock for one week, d/c brace by 4 weeks if quad control appropriate. Progress PROM AAROM and AROM as tolerated for 0-6 weeks; with limitation of 0-90 for first 4 weeks. Pt goes for manip under anesth on 03/02/22.   PRECAUTIONS: ACL protocol  SUBJECTIVE:  Patient reports their (R) knee feels good today and reports pain in the (R) knee at a 1/10 on the NPRS currently. Overall, the patient reports he feels like he is making progress in the right direction but still has significant progress to still make before he feels confident in returning to basketball. The patient reports he feels around 70% of his normal self before the ACL tear and surgery.   PAIN:  Are you having pain? Yes: NPRS scale: 1/10 Pain location: Rt Patella, quad   Pain description: post surgical Aggravating factors: movement Relieving factors: elevating, ice, rest  INTERVENTION   Right Left  Hip flexion 5 5  Hip Abduction 5 5  Hip Adduction 4+ 4+  Knee Extension  4+ 5  Knee Flexion 4+ 5  DF 5 5  PF 4+  5   TREATMENT in well zone:  Bike Warm Up: - Forward/backwards cycle x 3 minutes  Return to Sport Testing: - Single Leg Hops  Right: 37.6, 39, 40 in (Average: 38.8 in) Left: 74.5, 78, 80 in (Average: 77.5 in)  - Y-Test  Right leg: Forward (23.25 in), Behind Leg (31 in), Lateral (33 in) - Average (29 in)  Left leg: Forward (26.25 in), Behind leg (33 in), Lateral (36.5 in) - Average (31.9)  Return to Running:  - Walk 2 minutes warm-up at 3.5 - Run 1/2 mile without stopping (First 1.5 minutes at 6  mps, last 3 minutes at 6 mps)   *Patient reported an increase in (R) anterior knee pain following 1/2 mile run*   Trigger Point Dry Needling (TDN), unbilled Education performed with patient regarding potential benefit of TDN. Reviewed precautions and risks with patient. Reviewed special precautions/risks over lung fields which include pneumothorax. Reviewed signs and symptoms of pneumothorax and advised pt to go to ER immediately if these symptoms develop advise them of dry needling treatment. Extensive time spent with pt to ensure full understanding of TDN risks. Pt provided verbal consent to treatment. TDN performed to  with 0.25 x 40 single needle placements with local twitch response (LTR). Pistoning technique utilized. Improved pain-free motion following intervention. R hamstring  R quad x 2 minutes.   PATIENT EDUCATION: Education details: exercise technique, body mechanics, protocol Person educated: Patient Education method: Explanation, Demonstration, Tactile cues, and Verbal cues Education comprehension: verbalized understanding, returned demonstration, verbal cues required, and tactile cues required  HOME EXERCISE PROGRAM: Access Code: [email or web address] URL: [email or web address] Date: 12/15/2021 Prepared by: [name]  Exercises- Long Sitting Quad Set  - 1 x daily - 7 x weekly - 2 sets - 10 reps - 5 hold - Active Straight Leg Raise with Quad Set  - 1 x daily - 7 x weekly - 2 sets - 10 reps - 5 hold - Mini Squat with Counter Support  - 1 x daily - 7 x weekly - 2 sets - 10 reps - 5 hold - Side Lunge with Counter Support  - 1 x daily - 7 x weekly - 2 sets - 10 reps - 5 hold - Seated Ankle Alphabet  - 1 x daily - 7 x weekly - 2 sets - 10 reps - 5 hold   PT Short Term Goals       PT SHORT TERM GOAL #1   Title Patient will be independent in home exercise program to improve strength/mobility for better functional independence with ADLs.    Baseline 6/12; HEP given 7/20 : HEP  compliant    Time 4    Status Achieved   Target Date 01/02/22      PT SHORT TERM GOAL #2   Title Patient will report a worst pain of 5/10 on VAS in R knee to improve tolerance with ADLs and reduced symptoms with activities.    Baseline 6/12: 8/10 7/20: 6/10 8/8: 5/10    Time 4    Period Weeks    Status MET   Target Date 01/02/22      PT SHORT TERM GOAL #3   Title Patient will increase 10 meter walk test to >1.60ms with LRAD as to improve gait speed for better community ambulation and to reduce fall risk.    Baseline 6/12:0.77 with crutches 7/20: 1.6 m/s    Time 4    Period Weeks    Status Achieved  Target Date 01/02/22      PT SHORT TERM GOAL #4   Title --    Baseline --    Time --    Period --    Status --    Target Date --      PT SHORT TERM GOAL #5   Title --    Baseline --    Time --    Period --    Status --    Target Date --              PT Long Term Goals      PT LONG TERM GOAL #1   Title Patient will increase FOTO score to equal to or greater than  71   to demonstrate statistically significant improvement in mobility and quality of life.    Baseline 6/12: 30 7/20: 51 8/8: 65% 8/31: 64% 10/5: 67%   Time 8 11/28: 71%   Period Weeks    Status Partially Met   Target Date 07/18/2022           PT LONG TERM GOAL #2   Title Patient will report a worst pain of 3/10 on VAS in  R knee  to improve tolerance with ADLs and reduced symptoms with activities.    Baseline 6/12: 8/10 pain 7/20: 6/10 8/8: 5/10 8/31: 6/10 10/5: 5/10 11/28: 5/10   Time 8    Period Weeks    Status Partially Met   Target Date 07/18/2022           PT LONG TERM GOAL #3   Title Patient will increase lower extremity functional scale to >60/80 to demonstrate improved functional mobility and increased tolerance with ADLs.    Baseline 6/12: 19/80 7/20: 45/80 8/8: 49/80 8/31 55/80 10/5: 58/80 11/28: 66/80   Time 8    Period Weeks    Status  Met   Target Date 05/25/2022          PT LONG TERM GOAL #4   Title Patient will improve R knee ROM (neutral to 120 degrees) for return to PLOF and safe functional mobility.    Baseline 6/12: locked in extension 7/20: seated 85 supine AROM: 75    extension: AROM -5 PROM -2 8/8:  supine AROM 75 PROM 80;   seated: AROM 73 PROM 86 8/31: supine:AROM 75 PROM 84   sitting; AROM 79 PROM 92 10/5: 128 degrees   Time 8    Period Weeks    Status MET   Target Date 03/28/2022       PT LONG TERM GOAL #5   Title Patient will increase six minute walk test distance to >1000 for progression to community ambulator and improve gait ability    Baseline 6/12: unable to test 7/20: 1340 w/o brace 8/8: 1690 ft    Time 8    Period Weeks    Status MET   Target Date 03/28/2022     PT LONG TERM GOAL #6  Title Patient will return to sport and running 5 miles without pain increase for return to PLOF   Baseline 8/8: unable to play or run 8/31: unable to 10/5: not cleared for return to sport yet 11/28: Patient was able to run 0.5 mile without stopping   Time 8   Period Weeks   Status On going  Target Date 07/18/2022             PT LONG TERM GOAL #7  Title Patient will increase BLE gross strength to  5/5 as to improve functional strength for independent gait, increased standing tolerance and increased ADL ability.  Baseline 10/5: see above 11/28: see above   Time 8   Period Weeks   Status Partially Met  Target Date 07/18/2022           PT LONG TERM GOAL #8  Title Patient will increase single leg hop test on the (R) leg to an average of > 75 in to demonstrate symmetry between legs and the ability to hop in order to return to sport with full confidence.   Baseline 11/28: Right: 37.6, 39, 40 in (Average: 38.8 in)  Time 8  Period Weeks   Status New  Target Date 07/18/2022       PT LONG TERM GOAL #9  Title Patient will increase Y-test on the (R) leg to an average of > 31.5 in to demonstrate symmetry between legs and increased  ability to maintain single leg balance.    Baseline 11/28:(R) Forward (23.25 in), Behind Leg (31 in), Lateral (33 in), Average (29 in)  Time 8  Period Weeks   Status New  Target Date 07/18/2022        Plan       Clinical Impression Statement Patient continues to make significant progress toward returning to basketball. The patient has demonstrated BLE gross strength improvement but still has progress to make with RLE strength. The patient displayed (R) calf weakness and still presents with atrophy of the lateral (R) calf compared to the (L) side. The patient also made improvements on the FOTO but still has not reached his therapy goal for it yet. The patient was able to achieve his goal score on the LEFS. The patient displayed significant asymmetries between legs with new tests, single leg hop test and Y-Test. The patient still has a lot of progress to make in returning to sport. Patient will continue to benefit from skilled physical therapy interventions in order to improve right knee strength, overall confidence in sport activities, and overall functional capability of the RLE in order to return allow return to his previous level of function.   Personal Factors and Comorbidities Profession;Transportation    Examination-Activity Limitations Bathing;Bed Mobility;Bend;Caring for Others;Carry;Dressing;Hygiene/Grooming;Lift;Locomotion Level;Toileting;Stand;Stairs;Squat;Sit;Transfers    Examination-Participation Restrictions Cleaning;Community Activity;Driving;Meal Prep;Laundry;Occupation;Shop;Yard Work;Volunteer    Stability/Clinical Decision Making Stable/Uncomplicated    Rehab Potential Good    PT Frequency 2x / week    PT Duration 8 weeks    PT Treatment/Interventions ADLs/Self Care Home Management;Cryotherapy;Electrical Stimulation;Iontophoresis 3m/ml Dexamethasone;Moist Heat;Gait training;DME Instruction;Stair training;Functional mobility training;Therapeutic activities;Therapeutic  exercise;Patient/family education;Neuromuscular re-education;Balance training;Orthotic Fit/Training;Manual techniques;Passive range of motion;Scar mobilization;Manual lymph drainage;Dry needling;Energy conservation;Splinting;Taping    PT Next Visit Plan    PT Home Exercise Plan see above    Consulted and Agree with Plan of Care Patient            PSudie BaileySPT   This entire session was performed under direct supervision and direction of a licensed therapist/therapist assistant . I have personally read, edited and approve of the note as written.  MJanna ArchPT  05/23/2022, 9:04 AM

## 2022-05-24 NOTE — Therapy (Signed)
OUTPATIENT PHYSICAL THERAPY TREATMENT NOTE  Patient Name: Douglas Adams MRN: [id number] DOB:June 10, 2001, 21 y.o., male Today's Date: 05/25/2022  PCP: Saddie Benders MD  REFERRING PROVIDER: Saddie Benders MD  Past Medical History:  Diagnosis Date   Allergies    PONV (postoperative nausea and vomiting) 12/01/2021   pt's with extended recovery- vomiting x 3 hrs.   Past Surgical History:  Procedure Laterality Date   ADENOIDECTOMY AND MYRINGOTOMY WITH TUBE PLACEMENT Bilateral 2007   ANTERIOR CRUCIATE LIGAMENT REPAIR Right 12/01/2021   Procedure: RECONSTRUCTION ANTERIOR CRUCIATE LIGAMENT (ACL);  Surgeon: Hiram Gash, MD;  Location: [place];  Service: Orthopedics;  Laterality: Right;   KNEE ARTHROSCOPY Right 03/02/2022   Procedure: ARTHROSCOPY KNEE;  Surgeon: Hiram Gash, MD;  Location: [place];  Service: Orthopedics;  Laterality: Right;   LYSIS OF ADHESION Right 03/02/2022   Procedure: LYSIS OF ADHESION/MANIPULATION;  Surgeon: Hiram Gash, MD;  Location: [place];  Service: Orthopedics;  Laterality: Right;   There are no problems to display for this patient.   REFERRING DIAG: ACL reconstruction and arthroscopy of R knee  THERAPY DIAG:  Stiffness of right knee, not elsewhere classified  Difficulty in walking, not elsewhere classified  Rationale for Evaluation and Treatment Rehabilitation  PERTINENT HISTORY: Patient is a 21 year old male s/p ACL reconstruction with meniscal repair 12/01/21. Patient was injured with a twisting injury while playing basketball where he felt a pop and was unable to bear weight. Protocol not provided however [name] has protocol from clinic: knee extension lock for one week, d/c brace by 4 weeks if quad control appropriate. Progress PROM AAROM and AROM as tolerated for 0-6 weeks; with limitation of 0-90 for first 4 weeks. Pt goes for manip under anesth on 03/02/22.   PRECAUTIONS: ACL protocol  SUBJECTIVE:   Patient reports pain in the (R) knee at a 3/10 on the NPRS. The patient reports feeling a little achy in their (R) knee from the cold this morning but overall is feeling pretty good today.   PAIN:  Are you having pain? Yes: NPRS scale: 5/10 Pain location: Rt Patella, quad   Pain description: post surgical Aggravating factors: movement Relieving factors: elevating, ice, rest  INTERVENTION   Right Left  Hip flexion 4- 4+  Hip Abduction 4- 4+  Hip Adduction 4- 4+  Knee Extension  4 4+  Knee Flexion 3+ 4  DF 5 5  PF 4+ 5   Bike in well zone: Warm Up: 6 minutes on bike (Alternating forward and backward every minute)   Calf Strengthening: (Targeting Lateral Calf) - Standing elevated calf raises with toes pointing inwards 2x10 (*Patient required curing to prevent use of glutes during calf raises and to focus on calf activation*)   - Standing Calf Raises with 4-5th toes off edge of 1 inch step 2x8 (B) - Standing Calf Pulses with 4-5th toes off edge of 1 inch step 2x25 seconds (B) - Single leg hops with TB pulling medially 30 seconds x2 - Step to and load single hops with band pulling posteriorly 30 seconds x2  Standing Strengthening:  - Standing 4 way Lunges (Forward, Lateral, Backward, medially) 1x3 ea side BW - Standing 4 way Lunges (Forward, Lateral, Backward, medially) 1x5 ea side holding 9 lb DB to chest   Acceleration and Deceleration Control:  - Running forward and back peddling back 10x  - Running forward and back peddling back 10x while passing basketball at end cones  -  Running forward and back peddling back 10x while passing basketball throughout - Lateral shuffling 10x  - Lateral shuffling while passing basketball in middle of cones 10x  - Lateral shuffling while passing basketball in throughout 10x   - Basketball chest pass into accelerating run to deceleration 5x   (*Patient had increase knee valgus when planting with the (R) leg*, work on preventing this in  future sessions)   Ice cup massage 5 minutes  PATIENT EDUCATION: Education details: exercise technique, body mechanics, protocol Person educated: Patient Education method: Explanation, Demonstration, Tactile cues, and Verbal cues Education comprehension: verbalized understanding, returned demonstration, verbal cues required, and tactile cues required  HOME EXERCISE PROGRAM: Access Code: [email or web address] URL: [email or web address] Date: 12/15/2021 Prepared by: [name]  Exercises - Long Sitting Quad Set  - 1 x daily - 7 x weekly - 2 sets - 10 reps - 5 hold - Active Straight Leg Raise with Quad Set  - 1 x daily - 7 x weekly - 2 sets - 10 reps - 5 hold - Mini Squat with Counter Support  - 1 x daily - 7 x weekly - 2 sets - 10 reps - 5 hold - Side Lunge with Counter Support  - 1 x daily - 7 x weekly - 2 sets - 10 reps - 5 hold - Seated Ankle Alphabet  - 1 x daily - 7 x weekly - 2 sets - 10 reps - 5 hold   PT Short Term Goals       PT SHORT TERM GOAL #1   Title Patient will be independent in home exercise program to improve strength/mobility for better functional independence with ADLs.    Baseline 6/12; HEP given 7/20 : HEP compliant    Time 4    Status Achieved   Target Date 01/02/22      PT SHORT TERM GOAL #2   Title Patient will report a worst pain of 5/10 on VAS in R knee to improve tolerance with ADLs and reduced symptoms with activities.    Baseline 6/12: 8/10 7/20: 6/10 8/8: 5/10    Time 4    Period Weeks    Status MET   Target Date 01/02/22      PT SHORT TERM GOAL #3   Title Patient will increase 10 meter walk test to >1.20ms with LRAD as to improve gait speed for better community ambulation and to reduce fall risk.    Baseline 6/12:0.77 with crutches 7/20: 1.6 m/s    Time 4    Period Weeks    Status Achieved   Target Date 01/02/22      PT SHORT TERM GOAL #4   Title --    Baseline --    Time --    Period --    Status --    Target Date --      PT  SHORT TERM GOAL #5   Title --    Baseline --    Time --    Period --    Status --    Target Date --              PT Long Term Goals      PT LONG TERM GOAL #1   Title Patient will increase FOTO score to equal to or greater than  71   to demonstrate statistically significant improvement in mobility and quality of life.    Baseline 6/12: 30 7/20: 51 8/8: 65% 8/31: 64% 10/5: 67%   Time 8  Period Weeks    Status Partially Met   Target Date 05/25/2022         PT LONG TERM GOAL #2   Title Patient will report a worst pain of 3/10 on VAS in  R knee  to improve tolerance with ADLs and reduced symptoms with activities.    Baseline 6/12: 8/10 pain 7/20: 6/10 8/8: 5/10 8/31: 6/10 10/5: 5/10    Time 8    Period Weeks    Status Partially Met   Target Date 05/25/2022         PT LONG TERM GOAL #3   Title Patient will increase lower extremity functional scale to >60/80 to demonstrate improved functional mobility and increased tolerance with ADLs.    Baseline 6/12: 19/80 7/20: 45/80 8/8: 49/80 8/31 55/80 10/5: 58/80   Time 8    Period Weeks    Status Partially Met   Target Date 05/25/2022         PT LONG TERM GOAL #4   Title Patient will improve R knee ROM (neutral to 120 degrees) for return to PLOF and safe functional mobility.    Baseline 6/12: locked in extension 7/20: seated 85 supine AROM: 75    extension: AROM -5 PROM -2 8/8:  supine AROM 75 PROM 80;   seated: AROM 73 PROM 86 8/31: supine:AROM 75 PROM 84   sitting; AROM 79 PROM 92 10/5: 128 degrees   Time 8    Period Weeks    Status MET   Target Date 03/28/2022       PT LONG TERM GOAL #5   Title Patient will increase six minute walk test distance to >1000 for progression to community ambulator and improve gait ability    Baseline 6/12: unable to test 7/20: 1340 w/o brace 8/8: 1690 ft    Time 8    Period Weeks    Status MET   Target Date 03/28/2022     PT LONG TERM GOAL #6  Title Patient will return to  sport and running 5 miles without pain increase for return to PLOF   Baseline 8/8: unable to play or run 8/31: unable to 10/5: not cleared for return to sport yet  Time 8   Period Weeks   Status On going  Target Date 05/25/2022           PT LONG TERM GOAL #7  Title Patient will increase BLE gross strength to 5/5 as to improve functional strength for independent gait, increased standing tolerance and increased ADL ability.  Baseline 10/5: see above  Time 8   Period Weeks   Status New  Target Date 05/25/2022             Plan       Clinical Impression Statement Patient continues to present with excellent motivation. Strengthening exercises today focused on lateral calf activation and control during small amplitude plyometric work. The patient didn't experience any increase in (R) knee pain during therapeutic exercises today but did report experiencing some minor scar tissue break up during. The patient was able to perform cutting, shuffling, back peddling, and light plymometric work. The patient still lacks confidence in his (R) knee but continues to make gradual improvement mentally every therapy session. Patient will continue to benefit from skilled physical therapy interventions in order to improve right knee range of motion, strength, in order to return allow return to his previous level of function.   Personal Factors and Comorbidities Profession;Transportation    Examination-Activity  Limitations Bathing;Bed Mobility;Bend;Caring for Others;Carry;Dressing;Hygiene/Grooming;Lift;Locomotion Level;Toileting;Stand;Stairs;Squat;Sit;Transfers    Examination-Participation Restrictions Cleaning;Community Activity;Driving;Meal Prep;Laundry;Occupation;Shop;Yard Work;Volunteer    Stability/Clinical Decision Making Stable/Uncomplicated    Rehab Potential Good    PT Frequency 2x / week    PT Duration 8 weeks    PT Treatment/Interventions ADLs/Self Care Home  Management;Cryotherapy;Electrical Stimulation;Iontophoresis 4m/ml Dexamethasone;Moist Heat;Gait training;DME Instruction;Stair training;Functional mobility training;Therapeutic activities;Therapeutic exercise;Patient/family education;Neuromuscular re-education;Balance training;Orthotic Fit/Training;Manual techniques;Passive range of motion;Scar mobilization;Manual lymph drainage;Dry needling;Energy conservation;Splinting;Taping    PT Next Visit Plan    PT Home Exercise Plan see above    Consulted and Agree with Plan of Care Patient            PSudie BaileySPT   This entire session was performed under direct supervision and direction of a licensed therapist/therapist assistant . I have personally read, edited and approve of the note as written.  MJanna ArchPT  05/25/2022, 8:22 AM

## 2022-05-25 ENCOUNTER — Ambulatory Visit: Payer: 59

## 2022-05-25 DIAGNOSIS — R262 Difficulty in walking, not elsewhere classified: Secondary | ICD-10-CM

## 2022-05-25 DIAGNOSIS — M25661 Stiffness of right knee, not elsewhere classified: Secondary | ICD-10-CM

## 2022-05-25 DIAGNOSIS — M25561 Pain in right knee: Secondary | ICD-10-CM | POA: Diagnosis not present

## 2022-05-30 ENCOUNTER — Ambulatory Visit: Payer: 59 | Attending: Orthopaedic Surgery

## 2022-05-30 DIAGNOSIS — M25661 Stiffness of right knee, not elsewhere classified: Secondary | ICD-10-CM | POA: Insufficient documentation

## 2022-05-30 DIAGNOSIS — R262 Difficulty in walking, not elsewhere classified: Secondary | ICD-10-CM | POA: Diagnosis present

## 2022-05-30 DIAGNOSIS — M25561 Pain in right knee: Secondary | ICD-10-CM | POA: Insufficient documentation

## 2022-05-30 NOTE — Therapy (Signed)
OUTPATIENT PHYSICAL THERAPY TREATMENT NOTE  Patient Name: Douglas Adams MRN: [id number] DOB:2000/10/20, 21 y.o., male Today's Date: 05/30/2022  PCP: Saddie Benders MD  REFERRING PROVIDER: Saddie Benders MD  Past Medical History:  Diagnosis Date   Allergies    PONV (postoperative nausea and vomiting) 12/01/2021   pt's with extended recovery- vomiting x 3 hrs.   Past Surgical History:  Procedure Laterality Date   ADENOIDECTOMY AND MYRINGOTOMY WITH TUBE PLACEMENT Bilateral 2007   ANTERIOR CRUCIATE LIGAMENT REPAIR Right 12/01/2021   Procedure: RECONSTRUCTION ANTERIOR CRUCIATE LIGAMENT (ACL);  Surgeon: Hiram Gash, MD;  Location: [place];  Service: Orthopedics;  Laterality: Right;   KNEE ARTHROSCOPY Right 03/02/2022   Procedure: ARTHROSCOPY KNEE;  Surgeon: Hiram Gash, MD;  Location: [place];  Service: Orthopedics;  Laterality: Right;   LYSIS OF ADHESION Right 03/02/2022   Procedure: LYSIS OF ADHESION/MANIPULATION;  Surgeon: Hiram Gash, MD;  Location: [place];  Service: Orthopedics;  Laterality: Right;   There are no problems to display for this patient.   REFERRING DIAG: ACL reconstruction and arthroscopy of R knee  THERAPY DIAG:  Stiffness of right knee, not elsewhere classified  Difficulty in walking, not elsewhere classified  Rationale for Evaluation and Treatment Rehabilitation  PERTINENT HISTORY: Patient is a 21 year old male s/p ACL reconstruction with meniscal repair 12/01/21. Patient was injured with a twisting injury while playing basketball where he felt a pop and was unable to bear weight. Protocol not provided however [name] has protocol from clinic: knee extension lock for one week, d/c brace by 4 weeks if quad control appropriate. Progress PROM AAROM and AROM as tolerated for 0-6 weeks; with limitation of 0-90 for first 4 weeks. Pt goes for manip under anesth on 03/02/22.   PRECAUTIONS: ACL protocol  SUBJECTIVE:   Patient reports pain in the (R) knee at a 2/10 on the NPRS. The patient reports feeling good in their (R) knee today.   PAIN:  Are you having pain? Yes: NPRS scale: 2/10 Pain location: Rt Patella, quad   Pain description: post surgical Aggravating factors: movement Relieving factors: elevating, ice, rest  INTERVENTION   Right Left  Hip flexion 4- 4+  Hip Abduction 4- 4+  Hip Adduction 4- 4+  Knee Extension  4 4+  Knee Flexion 3+ 4  DF 5 5  PF 4+ 5   Bike in well zone: Warm Up: 6 minutes on bike (Alternating forward and backward every minute)   Calf Strengthening: (Targeting Lateral Calf) - Standing elevated calf raises with toes pointing inwards 2x10 (*Patient required curing to prevent use of glutes during calf raises and to focus on calf activation*)  - Standing Calf Raises with 4-5th toes off edge of 1 inch step 2x8 (B) - Standing Calf Pulses with 4-5th toes off edge of 1 inch step 2x25 seconds (B) - Single leg hops with TB pulling medially 30 seconds x2 - Step to and load single hops with band pulling posteriorly 30 seconds x2  Single Leg Stability: (Blaze Pods, 3 pods) - SL stance tapping 2x30 secs (Triangle setup) - SL hop to 2x30 secs (Triangle setup) - Staggered stance hop 2x30 secs (Umbrella setup)  Acceleration and Deceleration Control: (Blaze Pods, 6 pods)  - Random light up Lateral shuffling 2x30 secs  - Random distractor light up Lateral shuffling 2x30 secs  - Random distractor Lateral shuffling while passing basketball passing basketball at light up pods 1x1 minute  *Patient required verbal  cues to prevent knee valgus when planting with the (R) leg*  Ice cup massage 5 minutes  PATIENT EDUCATION: Education details: exercise technique, body mechanics, protocol Person educated: Patient Education method: Explanation, Demonstration, Tactile cues, and Verbal cues Education comprehension: verbalized understanding, returned demonstration, verbal cues required,  and tactile cues required  HOME EXERCISE PROGRAM: Access Code: [email or web address] URL: [email or web address] Date: 12/15/2021 Prepared by: [name]  Exercises - Long Sitting Quad Set  - 1 x daily - 7 x weekly - 2 sets - 10 reps - 5 hold - Active Straight Leg Raise with Quad Set  - 1 x daily - 7 x weekly - 2 sets - 10 reps - 5 hold - Mini Squat with Counter Support  - 1 x daily - 7 x weekly - 2 sets - 10 reps - 5 hold - Side Lunge with Counter Support  - 1 x daily - 7 x weekly - 2 sets - 10 reps - 5 hold - Seated Ankle Alphabet  - 1 x daily - 7 x weekly - 2 sets - 10 reps - 5 hold   PT Short Term Goals       PT SHORT TERM GOAL #1   Title Patient will be independent in home exercise program to improve strength/mobility for better functional independence with ADLs.    Baseline 6/12; HEP given 7/20 : HEP compliant    Time 4    Status Achieved   Target Date 01/02/22      PT SHORT TERM GOAL #2   Title Patient will report a worst pain of 5/10 on VAS in R knee to improve tolerance with ADLs and reduced symptoms with activities.    Baseline 6/12: 8/10 7/20: 6/10 8/8: 5/10    Time 4    Period Weeks    Status MET   Target Date 01/02/22      PT SHORT TERM GOAL #3   Title Patient will increase 10 meter walk test to >1.64ms with LRAD as to improve gait speed for better community ambulation and to reduce fall risk.    Baseline 6/12:0.77 with crutches 7/20: 1.6 m/s    Time 4    Period Weeks    Status Achieved   Target Date 01/02/22      PT SHORT TERM GOAL #4   Title --    Baseline --    Time --    Period --    Status --    Target Date --      PT SHORT TERM GOAL #5   Title --    Baseline --    Time --    Period --    Status --    Target Date --              PT Long Term Goals      PT LONG TERM GOAL #1   Title Patient will increase FOTO score to equal to or greater than  71   to demonstrate statistically significant improvement in mobility and quality of life.     Baseline 6/12: 30 7/20: 51 8/8: 65% 8/31: 64% 10/5: 67%   Time 8    Period Weeks    Status Partially Met   Target Date 05/25/2022         PT LONG TERM GOAL #2   Title Patient will report a worst pain of 3/10 on VAS in  R knee  to improve tolerance with ADLs and reduced symptoms with activities.  Baseline 6/12: 8/10 pain 7/20: 6/10 8/8: 5/10 8/31: 6/10 10/5: 5/10    Time 8    Period Weeks    Status Partially Met   Target Date 05/25/2022         PT LONG TERM GOAL #3   Title Patient will increase lower extremity functional scale to >60/80 to demonstrate improved functional mobility and increased tolerance with ADLs.    Baseline 6/12: 19/80 7/20: 45/80 8/8: 49/80 8/31 55/80 10/5: 58/80   Time 8    Period Weeks    Status Partially Met   Target Date 05/25/2022         PT LONG TERM GOAL #4   Title Patient will improve R knee ROM (neutral to 120 degrees) for return to PLOF and safe functional mobility.    Baseline 6/12: locked in extension 7/20: seated 85 supine AROM: 75    extension: AROM -5 PROM -2 8/8:  supine AROM 75 PROM 80;   seated: AROM 73 PROM 86 8/31: supine:AROM 75 PROM 84   sitting; AROM 79 PROM 92 10/5: 128 degrees   Time 8    Period Weeks    Status MET   Target Date 03/28/2022       PT LONG TERM GOAL #5   Title Patient will increase six minute walk test distance to >1000 for progression to community ambulator and improve gait ability    Baseline 6/12: unable to test 7/20: 1340 w/o brace 8/8: 1690 ft    Time 8    Period Weeks    Status MET   Target Date 03/28/2022     PT LONG TERM GOAL #6  Title Patient will return to sport and running 5 miles without pain increase for return to PLOF   Baseline 8/8: unable to play or run 8/31: unable to 10/5: not cleared for return to sport yet  Time 8   Period Weeks   Status On going  Target Date 05/25/2022           PT LONG TERM GOAL #7  Title Patient will increase BLE gross strength to 5/5 as to improve  functional strength for independent gait, increased standing tolerance and increased ADL ability.  Baseline 10/5: see above  Time 8   Period Weeks   Status New  Target Date 05/25/2022             Plan       Clinical Impression Statement Patient continues to present with excellent motivation. Strengthening exercises today focused on lateral calf activation and knee control during small amplitude plyometric work. The patient reported some minor discomfort in their (R) knee during SL hops, patient was educated to reduce jump amplitude. The patient didn't experience any increase in (R) knee pain during return to sport blaze pods dual tasks exercises.  The patient was able to perform cutting, lateral shuffling, and light plymometric work. The patient continues to gain confidence in his (R) knee every therapy session. Patient will continue to benefit from skilled physical therapy interventions in order to improve right knee range of motion, strength, in order to return allow return to his previous level of function.   Personal Factors and Comorbidities Profession;Transportation    Examination-Activity Limitations Bathing;Bed Mobility;Bend;Caring for Others;Carry;Dressing;Hygiene/Grooming;Lift;Locomotion Level;Toileting;Stand;Stairs;Squat;Sit;Transfers    Examination-Participation Restrictions Cleaning;Community Activity;Driving;Meal Prep;Laundry;Occupation;Shop;Yard Work;Volunteer    Stability/Clinical Decision Making Stable/Uncomplicated    Rehab Potential Good    PT Frequency 2x / week    PT Duration 8 weeks    PT Treatment/Interventions  ADLs/Self Care Home Management;Cryotherapy;Electrical Stimulation;Iontophoresis 39m/ml Dexamethasone;Moist Heat;Gait training;DME Instruction;Stair training;Functional mobility training;Therapeutic activities;Therapeutic exercise;Patient/family education;Neuromuscular re-education;Balance training;Orthotic Fit/Training;Manual techniques;Passive range of  motion;Scar mobilization;Manual lymph drainage;Dry needling;Energy conservation;Splinting;Taping    PT Next Visit Plan CREATE new updated HEP for patient.    PT Home Exercise Plan see above    Consulted and Agree with Plan of Care Patient            PSudie BaileySPT   This entire session was performed under direct supervision and direction of a licensed therapist/therapist assistant . I have personally read, edited and approve of the note as written.  MJanna ArchPT  05/30/2022, 8:18 AM

## 2022-06-01 ENCOUNTER — Ambulatory Visit: Payer: 59

## 2022-06-01 DIAGNOSIS — M25661 Stiffness of right knee, not elsewhere classified: Secondary | ICD-10-CM

## 2022-06-01 DIAGNOSIS — M25561 Pain in right knee: Secondary | ICD-10-CM

## 2022-06-01 DIAGNOSIS — R262 Difficulty in walking, not elsewhere classified: Secondary | ICD-10-CM

## 2022-06-01 NOTE — Therapy (Signed)
OUTPATIENT PHYSICAL THERAPY TREATMENT NOTE  Patient Name: Douglas Adams MRN: [id number] DOB:06-Sep-2000, 21 y.o., male Today's Date: 06/01/2022  PCP: Saddie Benders MD  REFERRING PROVIDER: Saddie Benders MD   PT End of Session - 06/01/22 0819     Visit Number 43    Number of Visits 53    Date for PT Re-Evaluation 07/18/21    Authorization Type [employer]; PN 11/28    Authorization Time Period 01/31/22-03/28/22    Progress Note Due on Visit 30    PT Start Time 0715    PT Stop Time 0800    PT Time Calculation (min) 45 min    Activity Tolerance Patient tolerated treatment well    Behavior During Therapy [facility] for tasks assessed/performed             Past Medical History:  Diagnosis Date   Allergies    PONV (postoperative nausea and vomiting) 12/01/2021   pt's with extended recovery- vomiting x 3 hrs.   Past Surgical History:  Procedure Laterality Date   ADENOIDECTOMY AND MYRINGOTOMY WITH TUBE PLACEMENT Bilateral 2007   ANTERIOR CRUCIATE LIGAMENT REPAIR Right 12/01/2021   Procedure: RECONSTRUCTION ANTERIOR CRUCIATE LIGAMENT (ACL);  Surgeon: Hiram Gash, MD;  Location: [facility];  Service: Orthopedics;  Laterality: Right;   KNEE ARTHROSCOPY Right 03/02/2022   Procedure: ARTHROSCOPY KNEE;  Surgeon: Hiram Gash, MD;  Location: [place];  Service: Orthopedics;  Laterality: Right;   LYSIS OF ADHESION Right 03/02/2022   Procedure: LYSIS OF ADHESION/MANIPULATION;  Surgeon: Hiram Gash, MD;  Location: [place];  Service: Orthopedics;  Laterality: Right;   There are no problems to display for this patient.   REFERRING DIAG: ACL reconstruction and arthroscopy of R knee  THERAPY DIAG:  Difficulty in walking, not elsewhere classified  Stiffness of right knee, not elsewhere classified  Acute pain of right knee  Rationale for Evaluation and Treatment Rehabilitation  PERTINENT HISTORY: Patient is a 21 year old male s/p ACL  reconstruction with meniscal repair 12/01/21. Patient was injured with a twisting injury while playing basketball where he felt a pop and was unable to bear weight. Protocol not provided however [name] has protocol from clinic: knee extension lock for one week, d/c brace by 4 weeks if quad control appropriate. Progress PROM AAROM and AROM as tolerated for 0-6 weeks; with limitation of 0-90 for first 4 weeks. Pt goes for manip under anesth on 03/02/22.   PRECAUTIONS: ACL protocol  SUBJECTIVE:  Patient reports pain in the (R) knee at a 2/10 on the NPRS. The patient reports feeling the same minor achy feeling throughout their (R) knee today.   PAIN:  Are you having pain? Yes: NPRS scale: 2/10 Pain location: Rt Patella, quad   Pain description: post surgical Aggravating factors: movement Relieving factors: elevating, ice, rest  INTERVENTION   Right Left  Hip flexion 4- 4+  Hip Abduction 4- 4+  Hip Adduction 4- 4+  Knee Extension  4 4+  Knee Flexion 3+ 4  DF 5 5  PF 4+ 5   Bike in well zone: Warm Up: 5 minutes on bike (Alternating forward and backward every minute)   Return to run protocol: - Walk 3 mps 1 minute 3x; jog 6.0 mps 1 minute x 3 trials  *Patient required cues to prevent from (B) feet from shifting outward and to promote more knee flexion in their (R) knee*  Single Leg Stability: (Blaze Pods, 3 pods) -  SL stance standing tapping 1x30 secs (Triangle setup) - SL stance standing on Airex Pad tapping 1x30 secs (Triangle setup) - SL stance standing on Airex Pad tapping with basketball chest pass between each tap 1x30 secs (Triangle setup) - Small amplitude SL hop to 1x30 secs (B) (Triangle setup)  Acceleration and Deceleration Control: (Blaze Pods, 6 pods)  - Random light up Lateral shuffling 2x30 secs  - Random distractor light up Lateral shuffling 2x30 secs  - Dual tasking light up lateral shuffling, red ball right hand, blue ball left hand catch and tap pod with it 1x30 secs   - Dual tasking light up lateral shuffling, red ball right hand, blue ball left hand catch and tap pod with it 1x30 secs (SPED UP) - Dual tasking light up lateral shuffling, red ball right hand, blue ball left hand catch and tap pod with it 1x30 secs (SPED UP x2, pods closer)   *Patient required verbal cues to prevent knee valgus when planting with the (R) leg, but only needing the cue once and was able to implement cue throughout dual tasking*  Ice cup massage 5 minutes  Trigger Point Dry Needling (TDN), unbilled Education performed with patient regarding potential benefit of TDN. Reviewed precautions and risks with patient. Reviewed special precautions/risks over lung fields which include pneumothorax. Reviewed signs and symptoms of pneumothorax and advised pt to go to ER immediately if these symptoms develop advise them of dry needling treatment. Extensive time spent with pt to ensure full understanding of TDN risks. Pt provided verbal consent to treatment. TDN performed to  with 0.3 x 60 single needle placements with local twitch response (LTR). Pistoning technique utilized. Improved pain-free motion following intervention. TDN performed to (R) hamstring. X2 minutes  PATIENT EDUCATION: Education details: exercise technique, body mechanics, protocol Person educated: Patient Education method: Explanation, Demonstration, Tactile cues, and Verbal cues Education comprehension: verbalized understanding, returned demonstration, verbal cues required, and tactile cues required  HOME EXERCISE PROGRAM: Access Code: [email or web address] URL: [email or web address] Date: 12/15/2021 Prepared by: [name]  Exercises - Long Sitting Quad Set  - 1 x daily - 7 x weekly - 2 sets - 10 reps - 5 hold - Active Straight Leg Raise with Quad Set  - 1 x daily - 7 x weekly - 2 sets - 10 reps - 5 hold - Mini Squat with Counter Support  - 1 x daily - 7 x weekly - 2 sets - 10 reps - 5 hold - Side Lunge with  Counter Support  - 1 x daily - 7 x weekly - 2 sets - 10 reps - 5 hold - Seated Ankle Alphabet  - 1 x daily - 7 x weekly - 2 sets - 10 reps - 5 hold   PT Short Term Goals       PT SHORT TERM GOAL #1   Title Patient will be independent in home exercise program to improve strength/mobility for better functional independence with ADLs.    Baseline 6/12; HEP given 7/20 : HEP compliant    Time 4    Status Achieved   Target Date 01/02/22      PT SHORT TERM GOAL #2   Title Patient will report a worst pain of 5/10 on VAS in R knee to improve tolerance with ADLs and reduced symptoms with activities.    Baseline 6/12: 8/10 7/20: 6/10 8/8: 5/10    Time 4    Period Weeks    Status MET   Target Date 01/02/22  PT SHORT TERM GOAL #3   Title Patient will increase 10 meter walk test to >1.14ms with LRAD as to improve gait speed for better community ambulation and to reduce fall risk.    Baseline 6/12:0.77 with crutches 7/20: 1.6 m/s    Time 4    Period Weeks    Status Achieved   Target Date 01/02/22      PT SHORT TERM GOAL #4   Title --    Baseline --    Time --    Period --    Status --    Target Date --      PT SHORT TERM GOAL #5   Title --    Baseline --    Time --    Period --    Status --    Target Date --              PT Long Term Goals      PT LONG TERM GOAL #1   Title Patient will increase FOTO score to equal to or greater than  71   to demonstrate statistically significant improvement in mobility and quality of life.    Baseline 6/12: 30 7/20: 51 8/8: 65% 8/31: 64% 10/5: 67%   Time 8    Period Weeks    Status Partially Met   Target Date 05/25/2022         PT LONG TERM GOAL #2   Title Patient will report a worst pain of 3/10 on VAS in  R knee  to improve tolerance with ADLs and reduced symptoms with activities.    Baseline 6/12: 8/10 pain 7/20: 6/10 8/8: 5/10 8/31: 6/10 10/5: 5/10    Time 8    Period Weeks    Status Partially Met   Target Date  05/25/2022         PT LONG TERM GOAL #3   Title Patient will increase lower extremity functional scale to >60/80 to demonstrate improved functional mobility and increased tolerance with ADLs.    Baseline 6/12: 19/80 7/20: 45/80 8/8: 49/80 8/31 55/80 10/5: 58/80   Time 8    Period Weeks    Status Partially Met   Target Date 05/25/2022         PT LONG TERM GOAL #4   Title Patient will improve R knee ROM (neutral to 120 degrees) for return to PLOF and safe functional mobility.    Baseline 6/12: locked in extension 7/20: seated 85 supine AROM: 75    extension: AROM -5 PROM -2 8/8:  supine AROM 75 PROM 80;   seated: AROM 73 PROM 86 8/31: supine:AROM 75 PROM 84   sitting; AROM 79 PROM 92 10/5: 128 degrees   Time 8    Period Weeks    Status MET   Target Date 03/28/2022       PT LONG TERM GOAL #5   Title Patient will increase six minute walk test distance to >1000 for progression to community ambulator and improve gait ability    Baseline 6/12: unable to test 7/20: 1340 w/o brace 8/8: 1690 ft    Time 8    Period Weeks    Status MET   Target Date 03/28/2022     PT LONG TERM GOAL #6  Title Patient will return to sport and running 5 miles without pain increase for return to PLOF   Baseline 8/8: unable to play or run 8/31: unable to 10/5: not cleared for return to sport yet  Time 8   Period Weeks   Status On going  Target Date 05/25/2022           PT LONG TERM GOAL #7  Title Patient will increase BLE gross strength to 5/5 as to improve functional strength for independent gait, increased standing tolerance and increased ADL ability.  Baseline 10/5: see above  Time 8   Period Weeks   Status New  Target Date 05/25/2022             Plan       Clinical Impression Statement Patient continues to present with excellent motivation. The patient reported some minor discomfort in their (R) knee during interval running on the treadmill. The patient reported a 4/10 pain on  the NPRS in their anterior knee when initially beginning to run and when they transitioned from running back to waking. Patient required cues to prevent from (B) feet from shifting outward and to promote more knee flexion in their (R) knee while running. The patient was able to fix one cue at a time but was unable to promote knee flexion and prevent (B) feet from shifting outward simultaneously. The patient had increased (R) quad tension and was dry needled prior to return to sport activity. The patient reported decreased tension in their (R) knee following. The patient performed great with return to sport dual task exercises and didn't experience any increase in (R) knee pain during return to sport blaze pods dual tasks exercises. Patient will continue to benefit from skilled physical therapy interventions in order to improve right knee range of motion, strength, in order to return allow return to his previous level of function.   Personal Factors and Comorbidities Profession;Transportation    Examination-Activity Limitations Bathing;Bed Mobility;Bend;Caring for Others;Carry;Dressing;Hygiene/Grooming;Lift;Locomotion Level;Toileting;Stand;Stairs;Squat;Sit;Transfers    Examination-Participation Restrictions Cleaning;Community Activity;Driving;Meal Prep;Laundry;Occupation;Shop;Yard Work;Volunteer    Stability/Clinical Decision Making Stable/Uncomplicated    Rehab Potential Good    PT Frequency 2x / week    PT Duration 8 weeks    PT Treatment/Interventions ADLs/Self Care Home Management;Cryotherapy;Electrical Stimulation;Iontophoresis 59m/ml Dexamethasone;Moist Heat;Gait training;DME Instruction;Stair training;Functional mobility training;Therapeutic activities;Therapeutic exercise;Patient/family education;Neuromuscular re-education;Balance training;Orthotic Fit/Training;Manual techniques;Passive range of motion;Scar mobilization;Manual lymph drainage;Dry needling;Energy conservation;Splinting;Taping    PT  Next Visit Plan CREATE new updated HEP for patient.    PT Home Exercise Plan see above    Consulted and Agree with Plan of Care Patient            PSudie BaileySPT   This entire session was performed under direct supervision and direction of a licensed therapist/therapist assistant . I have personally read, edited and approve of the note as written.  MJanna ArchPT  06/01/2022, 9:35 AM

## 2022-06-05 NOTE — Therapy (Signed)
OUTPATIENT PHYSICAL THERAPY TREATMENT NOTE  Patient Name: Douglas Adams MRN: [id number] DOB:11/22/2000, 21 y.o., male Today's Date: 06/06/2022  PCP: Saddie Benders MD  REFERRING PROVIDER: Saddie Benders MD  PT End of Session - 06/06/22 1659     Visit Number 44    Number of Visits 31    Date for PT Re-Evaluation 07/18/21    Authorization Type [employer]; PN 11/28    Authorization Time Period 01/31/22-03/28/22    Progress Note Due on Visit 30    PT Start Time 1600    PT Stop Time 1645    PT Time Calculation (min) 45 min    Activity Tolerance Patient tolerated treatment well    Behavior During Therapy [facility] for tasks assessed/performed            Past Medical History:  Diagnosis Date   Allergies    PONV (postoperative nausea and vomiting) 12/01/2021   pt's with extended recovery- vomiting x 3 hrs.   Past Surgical History:  Procedure Laterality Date   ADENOIDECTOMY AND MYRINGOTOMY WITH TUBE PLACEMENT Bilateral 2007   ANTERIOR CRUCIATE LIGAMENT REPAIR Right 12/01/2021   Procedure: RECONSTRUCTION ANTERIOR CRUCIATE LIGAMENT (ACL);  Surgeon: Hiram Gash, MD;  Location: [place];  Service: Orthopedics;  Laterality: Right;   KNEE ARTHROSCOPY Right 03/02/2022   Procedure: ARTHROSCOPY KNEE;  Surgeon: Hiram Gash, MD;  Location: [place];  Service: Orthopedics;  Laterality: Right;   LYSIS OF ADHESION Right 03/02/2022   Procedure: LYSIS OF ADHESION/MANIPULATION;  Surgeon: Hiram Gash, MD;  Location: [place];  Service: Orthopedics;  Laterality: Right;   There are no problems to display for this patient.   REFERRING DIAG: ACL reconstruction and arthroscopy of R knee  THERAPY DIAG:  Difficulty in walking, not elsewhere classified  Stiffness of right knee, not elsewhere classified  Acute pain of right knee  Rationale for Evaluation and Treatment Rehabilitation  PERTINENT HISTORY: Patient is a 21 year old male s/p ACL  reconstruction with meniscal repair 12/01/21. Patient was injured with a twisting injury while playing basketball where he felt a pop and was unable to bear weight. Protocol not provided however [name] has protocol from clinic: knee extension lock for one week, d/c brace by 4 weeks if quad control appropriate. Progress PROM AAROM and AROM as tolerated for 0-6 weeks; with limitation of 0-90 for first 4 weeks. Pt goes for manip under anesth on 03/02/22.   PRECAUTIONS: ACL protocol  SUBJECTIVE:  Patient still reports pain in the (R) knee around a 2/10 on the NPRS. The patient reports feeling some soreness throughout their (R) knee. Additionally reported L hamstring tightness and discomfort.   PAIN:  Are you having pain? Yes: NPRS scale: 2/10 Pain location: Rt Patella, quad   Pain description: post surgical Aggravating factors: movement Relieving factors: elevating, ice, rest  INTERVENTION   Right Left  Hip flexion 4- 4+  Hip Abduction 4- 4+  Hip Adduction 4- 4+  Knee Extension  4 4+  Knee Flexion 3+ 4  DF 5 5  PF 4+ 5   There Ex Return to run protocol: - Gradual build up to 6 mps starting at 3 mps, progressing 0.5 mps every 30 seconds until 5 mps, 1 min jogging at 5, 5.5, 6, 6.5 mps  *Patient required cues to promote more knee flexion in their (R) knee*  Strengthening in Multiple Planes: (Blaze Pods, 3 pods) - 3 Way Lunges (Forward, Sideways, Backwards, Behind their  opposite leg) 5x1 (B) 15 lbs - Hamstring focus bridges 1x8  - SL hamstring bridges 1x8  - Seated soleus calf 1x10 (45lbs)   Acceleration and Deceleration Control: (Blaze Pods, 6 pods)  - Random light up Lateral shuffling to color that lights up 1x30 sec - Distractor setting light up Lateral shuffling tape pod with hand 1x30 secs  - Dual tasking light up lateral shuffling, red ball right hand, blue ball left hand catch and tap pod with it 2x30 secs  - Dual tasking light up lateral shuffling, red ball right hand, blue ball  left hand catch and tap pod with it, Green light catch green ball with both hands and tap pod, 1x30 secs   Trigger Point Dry Needling (TDN), unbilled Education performed with patient regarding potential benefit of TDN. Reviewed precautions and risks with patient. Reviewed special precautions/risks over lung fields which include pneumothorax. Reviewed signs and symptoms of pneumothorax and advised pt to go to ER immediately if these symptoms develop advise them of dry needling treatment. Extensive time spent with pt to ensure full understanding of TDN risks. Pt provided verbal consent to treatment. TDN performed to  with 0.3 x 60 single needle placements with local twitch response (LTR). Pistoning technique utilized. Improved pain-free motion following intervention. TDN performed to (B) hamstrings and (R) Quad. X2 minutes  PATIENT EDUCATION: Education details: exercise technique, body mechanics, protocol Person educated: Patient Education method: Explanation, Demonstration, Tactile cues, and Verbal cues Education comprehension: verbalized understanding, returned demonstration, verbal cues required, and tactile cues required  HOME EXERCISE PROGRAM: Access Code: [email or web address] URL: [email or web address] Date: 12/15/2021 Prepared by: [name]  Exercises - Long Sitting Quad Set  - 1 x daily - 7 x weekly - 2 sets - 10 reps - 5 hold - Active Straight Leg Raise with Quad Set  - 1 x daily - 7 x weekly - 2 sets - 10 reps - 5 hold - Mini Squat with Counter Support  - 1 x daily - 7 x weekly - 2 sets - 10 reps - 5 hold - Side Lunge with Counter Support  - 1 x daily - 7 x weekly - 2 sets - 10 reps - 5 hold - Seated Ankle Alphabet  - 1 x daily - 7 x weekly - 2 sets - 10 reps - 5 hold  Updated HEP: Access Code: [email or web address] URL: [email or web address] Date: 06/06/2022 Prepared by: [name]  Program Notes If you can all calf raises should be performed on an elevated surface.  The single leg calf raises will be standing Calf Raises with 4-5th toes off edge and standing Calf Pulses with 4-5th toes off edge (Med bridge didn't have these exercises). Seated calf raises will target the soleus. The bridges through your heels will target your glutes and hamstrings. To progress the exercises move your heels further out to create a longer lever arm, after you are comfortable with those you can more to sinle leg bridging.   Exercises - Standing Heel Raise with Toes Turned In  - 1 x daily - 7 x weekly - 3 sets - 10 reps - 2 hold - Single-Leg Calf Raise on Step  - 1 x daily - 7 x weekly - 3 sets - 12 reps - 2 hold - Single-Leg Calf Raise on Step Pulses  - 1 x daily - 7 x weekly - 3 sets - 10 reps - Seated Calf Raise with Weights on Thighs On Step  - 1 x daily - 7 x  weekly - 3 sets - 12 reps - 2 hold - Lunge Matrix  - 1 x daily - 7 x weekly - 3 sets - 5 reps - Bridge on Heels  - 1 x daily - 7 x weekly - 3 sets - 8 reps - 2 hold   PT Short Term Goals       PT SHORT TERM GOAL #1   Title Patient will be independent in home exercise program to improve strength/mobility for better functional independence with ADLs.    Baseline 6/12; HEP given 7/20 : HEP compliant    Time 4    Status Achieved   Target Date 01/02/22      PT SHORT TERM GOAL #2   Title Patient will report a worst pain of 5/10 on VAS in R knee to improve tolerance with ADLs and reduced symptoms with activities.    Baseline 6/12: 8/10 7/20: 6/10 8/8: 5/10    Time 4    Period Weeks    Status MET   Target Date 01/02/22      PT SHORT TERM GOAL #3   Title Patient will increase 10 meter walk test to >1.19ms with LRAD as to improve gait speed for better community ambulation and to reduce fall risk.    Baseline 6/12:0.77 with crutches 7/20: 1.6 m/s    Time 4    Period Weeks    Status Achieved   Target Date 01/02/22      PT SHORT TERM GOAL #4   Title --    Baseline --    Time --    Period --    Status --     Target Date --      PT SHORT TERM GOAL #5   Title --    Baseline --    Time --    Period --    Status --    Target Date --              PT Long Term Goals      PT LONG TERM GOAL #1   Title Patient will increase FOTO score to equal to or greater than  71   to demonstrate statistically significant improvement in mobility and quality of life.    Baseline 6/12: 30 7/20: 51 8/8: 65% 8/31: 64% 10/5: 67%   Time 8    Period Weeks    Status Partially Met   Target Date 05/25/2022         PT LONG TERM GOAL #2   Title Patient will report a worst pain of 3/10 on VAS in  R knee  to improve tolerance with ADLs and reduced symptoms with activities.    Baseline 6/12: 8/10 pain 7/20: 6/10 8/8: 5/10 8/31: 6/10 10/5: 5/10    Time 8    Period Weeks    Status Partially Met   Target Date 05/25/2022         PT LONG TERM GOAL #3   Title Patient will increase lower extremity functional scale to >60/80 to demonstrate improved functional mobility and increased tolerance with ADLs.    Baseline 6/12: 19/80 7/20: 45/80 8/8: 49/80 8/31 55/80 10/5: 58/80   Time 8    Period Weeks    Status Partially Met   Target Date 05/25/2022         PT LONG TERM GOAL #4   Title Patient will improve R knee ROM (neutral to 120 degrees) for return to PLOF and safe functional mobility.  Baseline 6/12: locked in extension 7/20: seated 85 supine AROM: 75    extension: AROM -5 PROM -2 8/8:  supine AROM 75 PROM 80;   seated: AROM 73 PROM 86 8/31: supine:AROM 75 PROM 84   sitting; AROM 79 PROM 92 10/5: 128 degrees   Time 8    Period Weeks    Status MET   Target Date 03/28/2022       PT LONG TERM GOAL #5   Title Patient will increase six minute walk test distance to >1000 for progression to community ambulator and improve gait ability    Baseline 6/12: unable to test 7/20: 1340 w/o brace 8/8: 1690 ft    Time 8    Period Weeks    Status MET   Target Date 03/28/2022     PT LONG TERM GOAL #6  Title  Patient will return to sport and running 5 miles without pain increase for return to PLOF   Baseline 8/8: unable to play or run 8/31: unable to 10/5: not cleared for return to sport yet  Time 8   Period Weeks   Status On going  Target Date 05/25/2022           PT LONG TERM GOAL #7  Title Patient will increase BLE gross strength to 5/5 as to improve functional strength for independent gait, increased standing tolerance and increased ADL ability.  Baseline 10/5: see above  Time 8   Period Weeks   Status New  Target Date 05/25/2022             Plan       Clinical Impression Statement Patient continues to present with excellent motivation. The patient reported some minor discomfort in their (R) knee today and some increased tension in their R and L hamstring and quad. Dry needling was performed prior to return to sport activity and the patient reported decreased tension in their (R) knee following. The patient was able to promote increased knee flexion and prevent (B) feet from shifting outward much better today. The patient's HEP as updated today and given to the patient, focus will be placed on lateral calf hypertrophy and progressing knee strength & stability. The patient demonstrated understanding of new HEP. The patient performed great with return to sport dual task exercises and didn't experience any increase in (R) knee pain during return to sport blaze pods dual tasks exercises. Patient will continue to benefit from skilled physical therapy interventions in order to improve right knee range of motion, strength, in order to return allow return to his previous level of function.   Personal Factors and Comorbidities Profession;Transportation    Examination-Activity Limitations Bathing;Bed Mobility;Bend;Caring for Others;Carry;Dressing;Hygiene/Grooming;Lift;Locomotion Level;Toileting;Stand;Stairs;Squat;Sit;Transfers    Examination-Participation Restrictions Cleaning;Community  Activity;Driving;Meal Prep;Laundry;Occupation;Shop;Yard Work;Volunteer    Stability/Clinical Decision Making Stable/Uncomplicated    Rehab Potential Good    PT Frequency 2x / week    PT Duration 8 weeks    PT Treatment/Interventions ADLs/Self Care Home Management;Cryotherapy;Electrical Stimulation;Iontophoresis 55m/ml Dexamethasone;Moist Heat;Gait training;DME Instruction;Stair training;Functional mobility training;Therapeutic activities;Therapeutic exercise;Patient/family education;Neuromuscular re-education;Balance training;Orthotic Fit/Training;Manual techniques;Passive range of motion;Scar mobilization;Manual lymph drainage;Dry needling;Energy conservation;Splinting;Taping    PT Next Visit Plan CREATE new updated HEP for patient.    PT Home Exercise Plan see above    Consulted and Agree with Plan of Care Patient            PSudie BaileySPT   This entire session was performed under direct supervision and direction of a licensed therapist/therapist assistant . I have personally  read, edited and approve of the note as written.  [name] PT  06/06/2022, 5:37 PM

## 2022-06-06 ENCOUNTER — Ambulatory Visit: Payer: 59

## 2022-06-06 DIAGNOSIS — M25661 Stiffness of right knee, not elsewhere classified: Secondary | ICD-10-CM

## 2022-06-06 DIAGNOSIS — M25561 Pain in right knee: Secondary | ICD-10-CM

## 2022-06-06 DIAGNOSIS — R262 Difficulty in walking, not elsewhere classified: Secondary | ICD-10-CM

## 2022-06-08 ENCOUNTER — Ambulatory Visit: Payer: 59

## 2022-06-08 DIAGNOSIS — R262 Difficulty in walking, not elsewhere classified: Secondary | ICD-10-CM

## 2022-06-08 DIAGNOSIS — M25661 Stiffness of right knee, not elsewhere classified: Secondary | ICD-10-CM | POA: Diagnosis not present

## 2022-06-08 NOTE — Therapy (Signed)
OUTPATIENT PHYSICAL THERAPY TREATMENT NOTE  Patient Name: Douglas Adams MRN: [id number] DOB:03/06/2001, 21 y.o., male Today's Date: 06/08/2022  PCP: Saddie Benders MD  REFERRING PROVIDER: Saddie Benders MD  Past Medical History:  Diagnosis Date   Allergies    PONV (postoperative nausea and vomiting) 12/01/2021   pt's with extended recovery- vomiting x 3 hrs.   Past Surgical History:  Procedure Laterality Date   ADENOIDECTOMY AND MYRINGOTOMY WITH TUBE PLACEMENT Bilateral 2007   ANTERIOR CRUCIATE LIGAMENT REPAIR Right 12/01/2021   Procedure: RECONSTRUCTION ANTERIOR CRUCIATE LIGAMENT (ACL);  Surgeon: Hiram Gash, MD;  Location: [facility];  Service: Orthopedics;  Laterality: Right;   KNEE ARTHROSCOPY Right 03/02/2022   Procedure: ARTHROSCOPY KNEE;  Surgeon: Hiram Gash, MD;  Location: [place];  Service: Orthopedics;  Laterality: Right;   LYSIS OF ADHESION Right 03/02/2022   Procedure: LYSIS OF ADHESION/MANIPULATION;  Surgeon: Hiram Gash, MD;  Location: [place];  Service: Orthopedics;  Laterality: Right;   There are no problems to display for this patient.   REFERRING DIAG: ACL reconstruction and arthroscopy of R knee  THERAPY DIAG:  No diagnosis found.  Rationale for Evaluation and Treatment Rehabilitation  PERTINENT HISTORY: Patient is a 21 year old male s/p ACL reconstruction with meniscal repair 12/01/21. Patient was injured with a twisting injury while playing basketball where he felt a pop and was unable to bear weight. Protocol not provided however [name] has protocol from clinic: knee extension lock for one week, d/c brace by 4 weeks if quad control appropriate. Progress PROM AAROM and AROM as tolerated for 0-6 weeks; with limitation of 0-90 for first 4 weeks. Pt goes for manip under anesth on 03/02/22.   PRECAUTIONS: ACL protocol  SUBJECTIVE:  Patient continues to report pain in their anterior (R) knee around a 2/10 on the  NPRS. The patient reports feeling some achiness in the anterior aspect of their (R) knee today and increased tension in their (L) hip today.    PAIN:  Are you having pain? Yes: NPRS scale: 2/10 Pain location: Rt Patella, quad   Pain description: post surgical Aggravating factors: movement Relieving factors: elevating, ice, rest  INTERVENTION   Right Left  Hip flexion 4- 4+  Hip Abduction 4- 4+  Hip Adduction 4- 4+  Knee Extension  4 4+  Knee Flexion 3+ 4  DF 5 5  PF 4+ 5   There Ex Warm up:  Forward and backward on stationary bike 5 min   Return to run protocol: - Gradual build up to 6 mps starting at 3 mps, progressing 0.5 mps every 30 seconds until 5 mps, 1 min jogging at 5, 5.5, 2 minutes at 6 mps   Acceleration and Deceleration Control: (Blaze Pods, 6 pods)  Activity Description: Home base finger running controlled deceleration at pod  Activity Setting:  Random Number of Pods:  6 Cycles/Sets: 6 sets Duration (Time or Hit Count):  30 seconds   *Progression for each set = 1 running to pods and back peddling to home base, 2 same but taping pods out, 3 catching red ball (right hand) and blue ball (left hand) and taping with foot at pods, 4 added basketball catch and tap (green pod with basketball), 5 & 6 decreased time between light up and used distractor setting instead to increase dual task demand*  Activity Description: Lateral shuffling to color that lights up and taps Activity Setting:  Distractor  Number of Pods:  6  Cycles/Sets:  2 (Second set catching red light/ball = right hand, blue light/ball = left hand, green light = basketball)  Duration (Time or Hit Count):  45 seconds  Trigger Point Dry Needling (TDN), unbilled Education performed with patient regarding potential benefit of TDN. Reviewed precautions and risks with patient. Reviewed special precautions/risks over lung fields which include pneumothorax. Reviewed signs and symptoms of pneumothorax and advised pt  to go to ER immediately if these symptoms develop advise them of dry needling treatment. Extensive time spent with pt to ensure full understanding of TDN risks. Pt provided verbal consent to treatment. TDN performed to  with 0.3 x 60 single needle placements with local twitch response (LTR). Pistoning technique utilized. Improved pain-free motion following intervention. TDN performed to (L) TFL. X5 minutes  Ice Cup massage 5x   PATIENT EDUCATION: Education details: exercise technique, body mechanics, protocol Person educated: Patient Education method: Explanation, Demonstration, Tactile cues, and Verbal cues Education comprehension: verbalized understanding, returned demonstration, verbal cues required, and tactile cues required  HOME EXERCISE PROGRAM: Access Code: [email or web address] URL: [email or web address] Date: 12/15/2021 Prepared by: [name]  Exercises - Long Sitting Quad Set  - 1 x daily - 7 x weekly - 2 sets - 10 reps - 5 hold - Active Straight Leg Raise with Quad Set  - 1 x daily - 7 x weekly - 2 sets - 10 reps - 5 hold - Mini Squat with Counter Support  - 1 x daily - 7 x weekly - 2 sets - 10 reps - 5 hold - Side Lunge with Counter Support  - 1 x daily - 7 x weekly - 2 sets - 10 reps - 5 hold - Seated Ankle Alphabet  - 1 x daily - 7 x weekly - 2 sets - 10 reps - 5 hold  Updated HEP: Access Code: [email or web address] URL: [email or web address] Date: 06/06/2022 Prepared by: [name]  Program Notes If you can all calf raises should be performed on an elevated surface. The single leg calf raises will be standing Calf Raises with 4-5th toes off edge and standing Calf Pulses with 4-5th toes off edge (Med bridge didn't have these exercises). Seated calf raises will target the soleus. The bridges through your heels will target your glutes and hamstrings. To progress the exercises move your heels further out to create a longer lever arm, after you are comfortable with  those you can more to sinle leg bridging.   Exercises - Standing Heel Raise with Toes Turned In  - 1 x daily - 7 x weekly - 3 sets - 10 reps - 2 hold - Single-Leg Calf Raise on Step  - 1 x daily - 7 x weekly - 3 sets - 12 reps - 2 hold - Single-Leg Calf Raise on Step Pulses  - 1 x daily - 7 x weekly - 3 sets - 10 reps - Seated Calf Raise with Weights on Thighs On Step  - 1 x daily - 7 x weekly - 3 sets - 12 reps - 2 hold - Lunge Matrix  - 1 x daily - 7 x weekly - 3 sets - 5 reps - Bridge on Heels  - 1 x daily - 7 x weekly - 3 sets - 8 reps - 2 hold   PT Short Term Goals       PT SHORT TERM GOAL #1   Title Patient will be independent in home exercise program to improve strength/mobility for better functional independence with ADLs.  Baseline 6/12; HEP given 7/20 : HEP compliant    Time 4    Status Achieved   Target Date 01/02/22      PT SHORT TERM GOAL #2   Title Patient will report a worst pain of 5/10 on VAS in R knee to improve tolerance with ADLs and reduced symptoms with activities.    Baseline 6/12: 8/10 7/20: 6/10 8/8: 5/10    Time 4    Period Weeks    Status MET   Target Date 01/02/22      PT SHORT TERM GOAL #3   Title Patient will increase 10 meter walk test to >1.21ms with LRAD as to improve gait speed for better community ambulation and to reduce fall risk.    Baseline 6/12:0.77 with crutches 7/20: 1.6 m/s    Time 4    Period Weeks    Status Achieved   Target Date 01/02/22      PT SHORT TERM GOAL #4   Title --    Baseline --    Time --    Period --    Status --    Target Date --      PT SHORT TERM GOAL #5   Title --    Baseline --    Time --    Period --    Status --    Target Date --              PT Long Term Goals      PT LONG TERM GOAL #1   Title Patient will increase FOTO score to equal to or greater than  71   to demonstrate statistically significant improvement in mobility and quality of life.    Baseline 6/12: 30 7/20: 51 8/8: 65%  8/31: 64% 10/5: 67%   Time 8    Period Weeks    Status Partially Met   Target Date 05/25/2022         PT LONG TERM GOAL #2   Title Patient will report a worst pain of 3/10 on VAS in  R knee  to improve tolerance with ADLs and reduced symptoms with activities.    Baseline 6/12: 8/10 pain 7/20: 6/10 8/8: 5/10 8/31: 6/10 10/5: 5/10    Time 8    Period Weeks    Status Partially Met   Target Date 05/25/2022         PT LONG TERM GOAL #3   Title Patient will increase lower extremity functional scale to >60/80 to demonstrate improved functional mobility and increased tolerance with ADLs.    Baseline 6/12: 19/80 7/20: 45/80 8/8: 49/80 8/31 55/80 10/5: 58/80   Time 8    Period Weeks    Status Partially Met   Target Date 05/25/2022         PT LONG TERM GOAL #4   Title Patient will improve R knee ROM (neutral to 120 degrees) for return to PLOF and safe functional mobility.    Baseline 6/12: locked in extension 7/20: seated 85 supine AROM: 75    extension: AROM -5 PROM -2 8/8:  supine AROM 75 PROM 80;   seated: AROM 73 PROM 86 8/31: supine:AROM 75 PROM 84   sitting; AROM 79 PROM 92 10/5: 128 degrees   Time 8    Period Weeks    Status MET   Target Date 03/28/2022       PT LONG TERM GOAL #5   Title Patient will increase six minute walk test distance  to >1000 for progression to community ambulator and improve gait ability    Baseline 6/12: unable to test 7/20: 1340 w/o brace 8/8: 1690 ft    Time 8    Period Weeks    Status MET   Target Date 03/28/2022     PT LONG TERM GOAL #6  Title Patient will return to sport and running 5 miles without pain increase for return to PLOF   Baseline 8/8: unable to play or run 8/31: unable to 10/5: not cleared for return to sport yet  Time 8   Period Weeks   Status On going  Target Date 05/25/2022           PT LONG TERM GOAL #7  Title Patient will increase BLE gross strength to 5/5 as to improve functional strength for independent  gait, increased standing tolerance and increased ADL ability.  Baseline 10/5: see above  Time 8   Period Weeks   Status New  Target Date 05/25/2022             Plan       Clinical Impression Statement Patient continues to present with excellent motivation. The patient continues to report some minor achiness in the anterior aspect of their (R) knee. The patient demonstrated significant progress while utilizing [employer], showcasing improved coordination, balance, and cognitive function. The incorporation of dual-tasking technology with color recognition and association with specific movements in Blaze Pods was strategically chosen to provide a dynamic training environment, enabling the patient to engage in simultaneous physical and cognitive tasks. This unique approach enhances not only their physical abilities but also fosters increased neural connectivity and mental awareness, contributing to a well-rounded and effective rehabilitation and training experience. Patient will continue to benefit from skilled physical therapy interventions in order to improve right knee range of motion, strength, in order to return allow return to his previous level of function.   Personal Factors and Comorbidities Profession;Transportation    Examination-Activity Limitations Bathing;Bed Mobility;Bend;Caring for Others;Carry;Dressing;Hygiene/Grooming;Lift;Locomotion Level;Toileting;Stand;Stairs;Squat;Sit;Transfers    Examination-Participation Restrictions Cleaning;Community Activity;Driving;Meal Prep;Laundry;Occupation;Shop;Yard Work;Volunteer    Stability/Clinical Decision Making Stable/Uncomplicated    Rehab Potential Good    PT Frequency 2x / week    PT Duration 8 weeks    PT Treatment/Interventions ADLs/Self Care Home Management;Cryotherapy;Electrical Stimulation;Iontophoresis 25m/ml Dexamethasone;Moist Heat;Gait training;DME Instruction;Stair training;Functional mobility training;Therapeutic  activities;Therapeutic exercise;Patient/family education;Neuromuscular re-education;Balance training;Orthotic Fit/Training;Manual techniques;Passive range of motion;Scar mobilization;Manual lymph drainage;Dry needling;Energy conservation;Splinting;Taping    PT Next Visit Plan CREATE new updated HEP for patient.    PT Home Exercise Plan see above    Consulted and Agree with Plan of Care Patient            PSudie BaileySPT   This entire session was performed under direct supervision and direction of a licensed therapist/therapist assistant . I have personally read, edited and approve of the note as written.  MJanna ArchPT  06/08/2022, 9:09 AM

## 2022-06-12 NOTE — Therapy (Signed)
OUTPATIENT PHYSICAL THERAPY TREATMENT NOTE  Patient Name: Douglas Adams MRN: [id number] DOB:2000/08/20, 21 y.o., male Today's Date: 06/13/2022  PCP: Saddie Benders MD  REFERRING PROVIDER: Saddie Benders MD  PT End of Session - 06/13/22 0712     Visit Number 46    Number of Visits 17    Date for PT Re-Evaluation 07/18/21    Authorization Type [employer] Employee; PN 11/28    Authorization Time Period 01/31/22-03/28/22    Progress Note Due on Visit 30    PT Start Time 0715    PT Stop Time 0759    PT Time Calculation (min) 44 min    Activity Tolerance Patient tolerated treatment well    Behavior During Therapy [facility] for tasks assessed/performed            Past Medical History:  Diagnosis Date   Allergies    PONV (postoperative nausea and vomiting) 12/01/2021   pt's with extended recovery- vomiting x 3 hrs.   Past Surgical History:  Procedure Laterality Date   ADENOIDECTOMY AND MYRINGOTOMY WITH TUBE PLACEMENT Bilateral 2007   ANTERIOR CRUCIATE LIGAMENT REPAIR Right 12/01/2021   Procedure: RECONSTRUCTION ANTERIOR CRUCIATE LIGAMENT (ACL);  Surgeon: Hiram Gash, MD;  Location: [place];  Service: Orthopedics;  Laterality: Right;   KNEE ARTHROSCOPY Right 03/02/2022   Procedure: ARTHROSCOPY KNEE;  Surgeon: Hiram Gash, MD;  Location: [place];  Service: Orthopedics;  Laterality: Right;   LYSIS OF ADHESION Right 03/02/2022   Procedure: LYSIS OF ADHESION/MANIPULATION;  Surgeon: Hiram Gash, MD;  Location: [place];  Service: Orthopedics;  Laterality: Right;   There are no problems to display for this patient.   REFERRING DIAG: ACL reconstruction and arthroscopy of R knee  THERAPY DIAG:  Stiffness of right knee, not elsewhere classified  Difficulty in walking, not elsewhere classified  Acute pain of right knee  Rationale for Evaluation and Treatment Rehabilitation  PERTINENT HISTORY: Patient is a 21 year old male s/p ACL  reconstruction with meniscal repair 12/01/21. Patient was injured with a twisting injury while playing basketball where he felt a pop and was unable to bear weight. Protocol not provided however [name] has protocol from clinic: knee extension lock for one week, d/c brace by 4 weeks if quad control appropriate. Progress PROM AAROM and AROM as tolerated for 0-6 weeks; with limitation of 0-90 for first 4 weeks. Pt goes for manip under anesth on 03/02/22.   PRECAUTIONS: ACL protocol  SUBJECTIVE:  Patient reports no falls or LOB, has been compliant with HEP but unable to run due to the rain.   PAIN:  Are you having pain? Yes: NPRS scale: 2/10 Pain location: Rt Patella, quad   Pain description: post surgical Aggravating factors: movement Relieving factors: elevating, ice, rest  INTERVENTION   Right Left  Hip flexion 4- 4+  Hip Abduction 4- 4+  Hip Adduction 4- 4+  Knee Extension  4 4+  Knee Flexion 3+ 4  DF 5 5  PF 4+ 5   There Ex Warm up:  Forward and backward on stationary bike 5 min   Return to run protocol: - ambulate at 3.2 mph for one minute, increase velocity to 5.5 for one minute 6 for second minute, 6.2 for third minute and fourth, return to 6 for five minute, and 5.5 for fifth minute walk 3.2 mph last minute  Jumping: Superset:3x through  Static jump in air 15x to stocking  Lateral jump to single limb  balance 10x each side  Acceleration and Deceleration Control: (Blaze Pods, 6 pods)  Activity Description: Home base finger running controlled deceleration at pod  Activity Setting:  Random Number of Pods:  6 Cycles/Sets: 6 sets Duration (Time or Hit Count):  30 seconds   *Progression for each set = 1 running to pods and back peddling to home base on L, 1 on R,, 3 catching red ball (right hand) and blue ball (left hand) and taping with foot at pods,  Activity Description: Lateral shuffling to color that lights up and taps Activity Setting:  Distractor  Number of Pods:   6 Cycles/Sets:  2 (Second set catching red light/ball = right hand, blue light/ball = left hand,  Duration (Time or Hit Count):  45 seconds  Ice cup massage x4 minutes  Trigger Point Dry Needling (TDN), unbilled Education performed with patient regarding potential benefit of TDN. Reviewed precautions and risks with patient. Reviewed special precautions/risks over lung fields which include pneumothorax. Reviewed signs and symptoms of pneumothorax and advised pt to go to ER immediately if these symptoms develop advise them of dry needling treatment. Extensive time spent with pt to ensure full understanding of TDN risks. Pt provided verbal consent to treatment. TDN performed to  with 0.3 x 60 single needle placements with local twitch response (LTR). Pistoning technique utilized. Improved pain-free motion following intervention.  R quad . X2 minutes  Ice Cup massage 5x   PATIENT EDUCATION: Education details: exercise technique, body mechanics, protocol Person educated: Patient Education method: Explanation, Demonstration, Tactile cues, and Verbal cues Education comprehension: verbalized understanding, returned demonstration, verbal cues required, and tactile cues required  HOME EXERCISE PROGRAM: Access Code: [email or web address] URL: [email or web address] Date: 12/15/2021 Prepared by: [name]  Exercises - Long Sitting Quad Set  - 1 x daily - 7 x weekly - 2 sets - 10 reps - 5 hold - Active Straight Leg Raise with Quad Set  - 1 x daily - 7 x weekly - 2 sets - 10 reps - 5 hold - Mini Squat with Counter Support  - 1 x daily - 7 x weekly - 2 sets - 10 reps - 5 hold - Side Lunge with Counter Support  - 1 x daily - 7 x weekly - 2 sets - 10 reps - 5 hold - Seated Ankle Alphabet  - 1 x daily - 7 x weekly - 2 sets - 10 reps - 5 hold  Updated HEP: Access Code: [email or web address] URL: [email or web address] Date: 06/06/2022 Prepared by: [name]  Program Notes If you can all calf  raises should be performed on an elevated surface. The single leg calf raises will be standing Calf Raises with 4-5th toes off edge and standing Calf Pulses with 4-5th toes off edge (Med bridge didn't have these exercises). Seated calf raises will target the soleus. The bridges through your heels will target your glutes and hamstrings. To progress the exercises move your heels further out to create a longer lever arm, after you are comfortable with those you can more to sinle leg bridging.   Exercises - Standing Heel Raise with Toes Turned In  - 1 x daily - 7 x weekly - 3 sets - 10 reps - 2 hold - Single-Leg Calf Raise on Step  - 1 x daily - 7 x weekly - 3 sets - 12 reps - 2 hold - Single-Leg Calf Raise on Step Pulses  - 1 x daily - 7 x weekly - 3 sets - 10 reps -  Seated Calf Raise with Weights on Thighs On Step  - 1 x daily - 7 x weekly - 3 sets - 12 reps - 2 hold - Lunge Matrix  - 1 x daily - 7 x weekly - 3 sets - 5 reps - Bridge on Heels  - 1 x daily - 7 x weekly - 3 sets - 8 reps - 2 hold   PT Short Term Goals       PT SHORT TERM GOAL #1   Title Patient will be independent in home exercise program to improve strength/mobility for better functional independence with ADLs.    Baseline 6/12; HEP given 7/20 : HEP compliant    Time 4    Status Achieved   Target Date 01/02/22      PT SHORT TERM GOAL #2   Title Patient will report a worst pain of 5/10 on VAS in R knee to improve tolerance with ADLs and reduced symptoms with activities.    Baseline 6/12: 8/10 7/20: 6/10 8/8: 5/10    Time 4    Period Weeks    Status MET   Target Date 01/02/22      PT SHORT TERM GOAL #3   Title Patient will increase 10 meter walk test to >1.77ms with LRAD as to improve gait speed for better community ambulation and to reduce fall risk.    Baseline 6/12:0.77 with crutches 7/20: 1.6 m/s    Time 4    Period Weeks    Status Achieved   Target Date 01/02/22      PT SHORT TERM GOAL #4   Title --    Baseline  --    Time --    Period --    Status --    Target Date --      PT SHORT TERM GOAL #5   Title --    Baseline --    Time --    Period --    Status --    Target Date --              PT Long Term Goals      PT LONG TERM GOAL #1   Title Patient will increase FOTO score to equal to or greater than  71   to demonstrate statistically significant improvement in mobility and quality of life.    Baseline 6/12: 30 7/20: 51 8/8: 65% 8/31: 64% 10/5: 67%   Time 8    Period Weeks    Status Partially Met   Target Date 05/25/2022         PT LONG TERM GOAL #2   Title Patient will report a worst pain of 3/10 on VAS in  R knee  to improve tolerance with ADLs and reduced symptoms with activities.    Baseline 6/12: 8/10 pain 7/20: 6/10 8/8: 5/10 8/31: 6/10 10/5: 5/10    Time 8    Period Weeks    Status Partially Met   Target Date 05/25/2022         PT LONG TERM GOAL #3   Title Patient will increase lower extremity functional scale to >60/80 to demonstrate improved functional mobility and increased tolerance with ADLs.    Baseline 6/12: 19/80 7/20: 45/80 8/8: 49/80 8/31 55/80 10/5: 58/80   Time 8    Period Weeks    Status Partially Met   Target Date 05/25/2022         PT LONG TERM GOAL #4   Title Patient will improve  R knee ROM (neutral to 120 degrees) for return to PLOF and safe functional mobility.    Baseline 6/12: locked in extension 7/20: seated 85 supine AROM: 75    extension: AROM -5 PROM -2 8/8:  supine AROM 75 PROM 80;   seated: AROM 73 PROM 86 8/31: supine:AROM 75 PROM 84   sitting; AROM 79 PROM 92 10/5: 128 degrees   Time 8    Period Weeks    Status MET   Target Date 03/28/2022       PT LONG TERM GOAL #5   Title Patient will increase six minute walk test distance to >1000 for progression to community ambulator and improve gait ability    Baseline 6/12: unable to test 7/20: 1340 w/o brace 8/8: 1690 ft    Time 8    Period Weeks    Status MET   Target Date  03/28/2022     PT LONG TERM GOAL #6  Title Patient will return to sport and running 5 miles without pain increase for return to PLOF   Baseline 8/8: unable to play or run 8/31: unable to 10/5: not cleared for return to sport yet  Time 8   Period Weeks   Status On going  Target Date 05/25/2022           PT LONG TERM GOAL #7  Title Patient will increase BLE gross strength to 5/5 as to improve functional strength for independent gait, increased standing tolerance and increased ADL ability.  Baseline 10/5: see above  Time 8   Period Weeks   Status New  Target Date 05/25/2022             Plan       Clinical Impression Statement Patient re-introduced to jumping, tolerated well with minimal inversion of knee when fatigue, added static jumping to POC. Patient agreeable with plan.   The patient demonstrated significant progress while utilizing [employer], showcasing improved coordination, balance, and cognitive function. The incorporation of dual-tasking technology with color recognition and association with specific movements in Blaze Pods was strategically chosen to provide a dynamic training environment, enabling the patient to engage in simultaneous physical and cognitive tasks. This unique approach enhances not only their physical abilities but also fosters increased neural connectivity and mental awareness, contributing to a well-rounded and effective rehabilitation and training experience. Patient will continue to benefit from skilled physical therapy interventions in order to improve right knee range of motion, strength, in order to return allow return to his previous level of function.   Personal Factors and Comorbidities Profession;Transportation    Examination-Activity Limitations Bathing;Bed Mobility;Bend;Caring for Others;Carry;Dressing;Hygiene/Grooming;Lift;Locomotion Level;Toileting;Stand;Stairs;Squat;Sit;Transfers    Examination-Participation Restrictions  Cleaning;Community Activity;Driving;Meal Prep;Laundry;Occupation;Shop;Yard Work;Volunteer    Stability/Clinical Decision Making Stable/Uncomplicated    Rehab Potential Good    PT Frequency 2x / week    PT Duration 8 weeks    PT Treatment/Interventions ADLs/Self Care Home Management;Cryotherapy;Electrical Stimulation;Iontophoresis 12m/ml Dexamethasone;Moist Heat;Gait training;DME Instruction;Stair training;Functional mobility training;Therapeutic activities;Therapeutic exercise;Patient/family education;Neuromuscular re-education;Balance training;Orthotic Fit/Training;Manual techniques;Passive range of motion;Scar mobilization;Manual lymph drainage;Dry needling;Energy conservation;Splinting;Taping    PT Next Visit Plan CREATE new updated HEP for patient.    PT Home Exercise Plan see above    Consulted and Agree with Plan of Care Patient             MJanna ArchPT  06/13/2022, 7:59 AM

## 2022-06-13 ENCOUNTER — Ambulatory Visit: Payer: 59

## 2022-06-13 DIAGNOSIS — M25561 Pain in right knee: Secondary | ICD-10-CM

## 2022-06-13 DIAGNOSIS — R262 Difficulty in walking, not elsewhere classified: Secondary | ICD-10-CM

## 2022-06-13 DIAGNOSIS — M25661 Stiffness of right knee, not elsewhere classified: Secondary | ICD-10-CM | POA: Diagnosis not present

## 2022-06-14 NOTE — Therapy (Signed)
OUTPATIENT PHYSICAL THERAPY TREATMENT NOTE  Patient Name: Douglas Adams MRN: [id number] DOB:23-Dec-2000, 21 y.o., male Today's Date: 06/15/2022  PCP: Saddie Benders MD  REFERRING PROVIDER: Saddie Benders MD  PT End of Session - 06/15/22 0716     Visit Number 47    Number of Visits 68    Date for PT Re-Evaluation 07/18/21    Authorization Type [employer] Employee; PN 11/28    Authorization Time Period 01/31/22-03/28/22    Progress Note Due on Visit 30    PT Start Time 0715    PT Stop Time 0759    PT Time Calculation (min) 44 min    Activity Tolerance Patient tolerated treatment well    Behavior During Therapy [facility] for tasks assessed/performed             Past Medical History:  Diagnosis Date   Allergies    PONV (postoperative nausea and vomiting) 12/01/2021   pt's with extended recovery- vomiting x 3 hrs.   Past Surgical History:  Procedure Laterality Date   ADENOIDECTOMY AND MYRINGOTOMY WITH TUBE PLACEMENT Bilateral 2007   ANTERIOR CRUCIATE LIGAMENT REPAIR Right 12/01/2021   Procedure: RECONSTRUCTION ANTERIOR CRUCIATE LIGAMENT (ACL);  Surgeon: Hiram Gash, MD;  Location: [place];  Service: Orthopedics;  Laterality: Right;   KNEE ARTHROSCOPY Right 03/02/2022   Procedure: ARTHROSCOPY KNEE;  Surgeon: Hiram Gash, MD;  Location: [place];  Service: Orthopedics;  Laterality: Right;   LYSIS OF ADHESION Right 03/02/2022   Procedure: LYSIS OF ADHESION/MANIPULATION;  Surgeon: Hiram Gash, MD;  Location: [place];  Service: Orthopedics;  Laterality: Right;   There are no problems to display for this patient.   REFERRING DIAG: ACL reconstruction and arthroscopy of R knee  THERAPY DIAG:  Stiffness of right knee, not elsewhere classified  Difficulty in walking, not elsewhere classified  Acute pain of right knee  Rationale for Evaluation and Treatment Rehabilitation  PERTINENT HISTORY: Patient is a 21 year old male s/p ACL  reconstruction with meniscal repair 12/01/21. Patient was injured with a twisting injury while playing basketball where he felt a pop and was unable to bear weight. Protocol not provided however [name] has protocol from clinic: knee extension lock for one week, d/c brace by 4 weeks if quad control appropriate. Progress PROM AAROM and AROM as tolerated for 0-6 weeks; with limitation of 0-90 for first 4 weeks. Pt goes for manip under anesth on 03/02/22.   PRECAUTIONS: ACL protocol  SUBJECTIVE:  Patient was ill yesterday with GI symptoms. Still recovering today .  PAIN:  Are you having pain? Yes: NPRS scale: 2/10 Pain location: Rt Patella, quad   Pain description: post surgical Aggravating factors: movement Relieving factors: elevating, ice, rest  INTERVENTION   Right Left  Hip flexion 4- 4+  Hip Abduction 4- 4+  Hip Adduction 4- 4+  Knee Extension  4 4+  Knee Flexion 3+ 4  DF 5 5  PF 4+ 5   There Ex Warm up:  Forward and backward on stationary bike 5 min   Return to run protocol: - ambulate at 3.2 mph for one minute, increase velocity to 5.5 for one minute 6 for second minute, 6.2 for third minute and fourth, return to 6 for five minute, and 5.5 for fifth minute walk 3.2 mph last minute -TERMINATED AT ONE MINUTE DUE TO DISCOMFORT  Dynamic mobility superset: x 2 sets Lean against wall jump away 10x each side;  Golfers tap cone  10x each LE  2 sets: Bridge with back on plinth 12x Sissy squat 10x  Ice cup massage x4 minutes  Trigger Point Dry Needling (TDN), unbilled Education performed with patient regarding potential benefit of TDN. Reviewed precautions and risks with patient. Reviewed special precautions/risks over lung fields which include pneumothorax. Reviewed signs and symptoms of pneumothorax and advised pt to go to ER immediately if these symptoms develop advise them of dry needling treatment. Extensive time spent with pt to ensure full understanding of TDN risks. Pt  provided verbal consent to treatment. TDN performed to  with 0.3 x 60 single needle placements with local twitch response (LTR). Pistoning technique utilized. Improved pain-free motion following intervention.  R quad . X2 minutes    PATIENT EDUCATION: Education details: exercise technique, body mechanics, protocol Person educated: Patient Education method: Explanation, Demonstration, Tactile cues, and Verbal cues Education comprehension: verbalized understanding, returned demonstration, verbal cues required, and tactile cues required  HOME EXERCISE PROGRAM: Access Code: [email or web address] URL: [email or web address] Date: 12/15/2021 Prepared by: [name]  Exercises - Long Sitting Quad Set  - 1 x daily - 7 x weekly - 2 sets - 10 reps - 5 hold - Active Straight Leg Raise with Quad Set  - 1 x daily - 7 x weekly - 2 sets - 10 reps - 5 hold - Mini Squat with Counter Support  - 1 x daily - 7 x weekly - 2 sets - 10 reps - 5 hold - Side Lunge with Counter Support  - 1 x daily - 7 x weekly - 2 sets - 10 reps - 5 hold - Seated Ankle Alphabet  - 1 x daily - 7 x weekly - 2 sets - 10 reps - 5 hold  Updated HEP: Access Code: [email or web address] URL: [email or web address] Date: 06/06/2022 Prepared by: [name]  Program Notes If you can all calf raises should be performed on an elevated surface. The single leg calf raises will be standing Calf Raises with 4-5th toes off edge and standing Calf Pulses with 4-5th toes off edge (Med bridge didn't have these exercises). Seated calf raises will target the soleus. The bridges through your heels will target your glutes and hamstrings. To progress the exercises move your heels further out to create a longer lever arm, after you are comfortable with those you can more to sinle leg bridging.   Exercises - Standing Heel Raise with Toes Turned In  - 1 x daily - 7 x weekly - 3 sets - 10 reps - 2 hold - Single-Leg Calf Raise on Step  - 1 x daily - 7  x weekly - 3 sets - 12 reps - 2 hold - Single-Leg Calf Raise on Step Pulses  - 1 x daily - 7 x weekly - 3 sets - 10 reps - Seated Calf Raise with Weights on Thighs On Step  - 1 x daily - 7 x weekly - 3 sets - 12 reps - 2 hold - Lunge Matrix  - 1 x daily - 7 x weekly - 3 sets - 5 reps - Bridge on Heels  - 1 x daily - 7 x weekly - 3 sets - 8 reps - 2 hold   PT Short Term Goals       PT SHORT TERM GOAL #1   Title Patient will be independent in home exercise program to improve strength/mobility for better functional independence with ADLs.    Baseline 6/12; HEP given 7/20 : HEP compliant    Time  4    Status Achieved   Target Date 01/02/22      PT SHORT TERM GOAL #2   Title Patient will report a worst pain of 5/10 on VAS in R knee to improve tolerance with ADLs and reduced symptoms with activities.    Baseline 6/12: 8/10 7/20: 6/10 8/8: 5/10    Time 4    Period Weeks    Status MET   Target Date 01/02/22      PT SHORT TERM GOAL #3   Title Patient will increase 10 meter walk test to >1.75ms with LRAD as to improve gait speed for better community ambulation and to reduce fall risk.    Baseline 6/12:0.77 with crutches 7/20: 1.6 m/s    Time 4    Period Weeks    Status Achieved   Target Date 01/02/22      PT SHORT TERM GOAL #4   Title --    Baseline --    Time --    Period --    Status --    Target Date --      PT SHORT TERM GOAL #5   Title --    Baseline --    Time --    Period --    Status --    Target Date --              PT Long Term Goals      PT LONG TERM GOAL #1   Title Patient will increase FOTO score to equal to or greater than  71   to demonstrate statistically significant improvement in mobility and quality of life.    Baseline 6/12: 30 7/20: 51 8/8: 65% 8/31: 64% 10/5: 67%   Time 8    Period Weeks    Status Partially Met   Target Date 05/25/2022         PT LONG TERM GOAL #2   Title Patient will report a worst pain of 3/10 on VAS in  R knee  to  improve tolerance with ADLs and reduced symptoms with activities.    Baseline 6/12: 8/10 pain 7/20: 6/10 8/8: 5/10 8/31: 6/10 10/5: 5/10    Time 8    Period Weeks    Status Partially Met   Target Date 05/25/2022         PT LONG TERM GOAL #3   Title Patient will increase lower extremity functional scale to >60/80 to demonstrate improved functional mobility and increased tolerance with ADLs.    Baseline 6/12: 19/80 7/20: 45/80 8/8: 49/80 8/31 55/80 10/5: 58/80   Time 8    Period Weeks    Status Partially Met   Target Date 05/25/2022         PT LONG TERM GOAL #4   Title Patient will improve R knee ROM (neutral to 120 degrees) for return to PLOF and safe functional mobility.    Baseline 6/12: locked in extension 7/20: seated 85 supine AROM: 75    extension: AROM -5 PROM -2 8/8:  supine AROM 75 PROM 80;   seated: AROM 73 PROM 86 8/31: supine:AROM 75 PROM 84   sitting; AROM 79 PROM 92 10/5: 128 degrees   Time 8    Period Weeks    Status MET   Target Date 03/28/2022       PT LONG TERM GOAL #5   Title Patient will increase six minute walk test distance to >1000 for progression to community ambulator and improve gait ability  Baseline 6/12: unable to test 7/20: 1340 w/o brace 8/8: 1690 ft    Time 8    Period Weeks    Status MET   Target Date 03/28/2022     PT LONG TERM GOAL #6  Title Patient will return to sport and running 5 miles without pain increase for return to PLOF   Baseline 8/8: unable to play or run 8/31: unable to 10/5: not cleared for return to sport yet  Time 8   Period Weeks   Status On going  Target Date 05/25/2022           PT LONG TERM GOAL #7  Title Patient will increase BLE gross strength to 5/5 as to improve functional strength for independent gait, increased standing tolerance and increased ADL ability.  Baseline 10/5: see above  Time 8   Period Weeks   Status New  Target Date 05/25/2022             Plan       Clinical Impression  Statement Patient presents with increased muscle aggravation and soreness due to recent GI illness causing dehydration. Running terminated this session but he is able to tolerate single limb stabilization and return to sport lateral movements without pain increase.    Patient will continue to benefit from skilled physical therapy interventions in order to improve right knee range of motion, strength, in order to return allow return to his previous level of function.   Personal Factors and Comorbidities Profession;Transportation    Examination-Activity Limitations Bathing;Bed Mobility;Bend;Caring for Others;Carry;Dressing;Hygiene/Grooming;Lift;Locomotion Level;Toileting;Stand;Stairs;Squat;Sit;Transfers    Examination-Participation Restrictions Cleaning;Community Activity;Driving;Meal Prep;Laundry;Occupation;Shop;Yard Work;Volunteer    Stability/Clinical Decision Making Stable/Uncomplicated    Rehab Potential Good    PT Frequency 2x / week    PT Duration 8 weeks    PT Treatment/Interventions ADLs/Self Care Home Management;Cryotherapy;Electrical Stimulation;Iontophoresis 9m/ml Dexamethasone;Moist Heat;Gait training;DME Instruction;Stair training;Functional mobility training;Therapeutic activities;Therapeutic exercise;Patient/family education;Neuromuscular re-education;Balance training;Orthotic Fit/Training;Manual techniques;Passive range of motion;Scar mobilization;Manual lymph drainage;Dry needling;Energy conservation;Splinting;Taping    PT Next Visit Plan CREATE new updated HEP for patient.    PT Home Exercise Plan see above    Consulted and Agree with Plan of Care Patient             MJanna ArchPT  06/15/2022, 7:58 AM

## 2022-06-15 ENCOUNTER — Ambulatory Visit: Payer: 59

## 2022-06-15 DIAGNOSIS — M25661 Stiffness of right knee, not elsewhere classified: Secondary | ICD-10-CM

## 2022-06-15 DIAGNOSIS — M25561 Pain in right knee: Secondary | ICD-10-CM

## 2022-06-15 DIAGNOSIS — R262 Difficulty in walking, not elsewhere classified: Secondary | ICD-10-CM

## 2022-06-15 NOTE — Therapy (Signed)
OUTPATIENT PHYSICAL THERAPY TREATMENT NOTE  Patient Name: Douglas Adams MRN: [id number] DOB:[birth year], 21 y.o., male Today's Date: 06/20/2022  PCP: Saddie Benders MD  REFERRING PROVIDER: Saddie Benders MD  PT End of Session - 06/20/22 0711     Visit Number 48    Number of Visits 52    Date for PT Re-Evaluation 07/18/21    Authorization Type [employer] Employee; PN 11/28    Authorization Time Period 01/31/22-03/28/22    Progress Note Due on Visit 30    PT Start Time 0715    PT Stop Time 0759    PT Time Calculation (min) 44 min    Activity Tolerance Patient tolerated treatment well    Behavior During Therapy [facility] for tasks assessed/performed              Past Medical History:  Diagnosis Date   Allergies    PONV (postoperative nausea and vomiting) 12/01/2021   pt's with extended recovery- vomiting x 3 hrs.   Past Surgical History:  Procedure Laterality Date   ADENOIDECTOMY AND MYRINGOTOMY WITH TUBE PLACEMENT Bilateral 2007   ANTERIOR CRUCIATE LIGAMENT REPAIR Right 12/01/2021   Procedure: RECONSTRUCTION ANTERIOR CRUCIATE LIGAMENT (ACL);  Surgeon: Hiram Gash, MD;  Location: [place];  Service: Orthopedics;  Laterality: Right;   KNEE ARTHROSCOPY Right 03/02/2022   Procedure: ARTHROSCOPY KNEE;  Surgeon: Hiram Gash, MD;  Location: [place];  Service: Orthopedics;  Laterality: Right;   LYSIS OF ADHESION Right 03/02/2022   Procedure: LYSIS OF ADHESION/MANIPULATION;  Surgeon: Hiram Gash, MD;  Location: [place];  Service: Orthopedics;  Laterality: Right;   There are no problems to display for this patient.   REFERRING DIAG: ACL reconstruction and arthroscopy of R knee  THERAPY DIAG:  Stiffness of right knee, not elsewhere classified  Difficulty in walking, not elsewhere classified  Acute pain of right knee  Rationale for Evaluation and Treatment Rehabilitation  PERTINENT HISTORY: Patient is a 21 year old male s/p ACL  reconstruction with meniscal repair 12/01/21. Patient was injured with a twisting injury while playing basketball where he felt a pop and was unable to bear weight. Protocol not provided however [name] has protocol from clinic: knee extension lock for one week, d/c brace by 4 weeks if quad control appropriate. Progress PROM AAROM and AROM as tolerated for 0-6 weeks; with limitation of 0-90 for first 4 weeks. Pt goes for manip under anesth on 03/02/22.   PRECAUTIONS: ACL protocol  SUBJECTIVE:  Patient had a good holiday, only being seen once this week due to his birthday.   PAIN:  Are you having pain? Yes: NPRS scale: 2/10 Pain location: Rt Patella, quad   Pain description: post surgical Aggravating factors: movement Relieving factors: elevating, ice, rest  INTERVENTION   Right Left  Hip flexion 4- 4+  Hip Abduction 4- 4+  Hip Adduction 4- 4+  Knee Extension  4 4+  Knee Flexion 3+ 4  DF 5 5  PF 4+ 5   There Ex Warm up:  Forward and backward on stationary bike 5 min   Return to run protocol: - alternate walk with run; 3 mph walk, 6 mph run; 1 minute each for 8 minutes/ cues for body mechanics.   Dynamic mobility superset: x 2 sets Lean against wall jump away 10x each side;  Squat to wall tap 10x  Activity Description: forward run, lateral tap R or L pending side running on, backward shuffle to [employer]  Activity Setting:  The [facility] setting was selected for the goal of improving spatial awareness and visual scanning ability, establishing a central point of reference during dynamic movements for enhanced balance and control.   Number of Pods:  5 Cycles/Sets:  10/2 sets Duration (Time or Hit Count):  10  Activity Description: lateral shuffle, catch Red ball with Right hand, blue ball with left hand squat and tap pod Activity Setting:  The [employer] Random setting was chosen to enhance cognitive processing and agility, providing an unpredictable environment to simulate  real-world scenarios, and fostering quick reactions and adaptability.   Number of Pods:  5 Cycles/Sets:  10/x2 Duration (Time or Hit Count):  10    Ice cup massage x4 minutes  Trigger Point Dry Needling (TDN), unbilled Education performed with patient regarding potential benefit of TDN. Reviewed precautions and risks with patient. Reviewed special precautions/risks over lung fields which include pneumothorax. Reviewed signs and symptoms of pneumothorax and advised pt to go to ER immediately if these symptoms develop advise them of dry needling treatment. Extensive time spent with pt to ensure full understanding of TDN risks. Pt provided verbal consent to treatment. TDN performed to  with 0.3 x 60 single needle placements with local twitch response (LTR). Pistoning technique utilized. Improved pain-free motion following intervention.  R quad . X2 minutes    PATIENT EDUCATION: Education details: exercise technique, body mechanics, protocol Person educated: Patient Education method: Explanation, Demonstration, Tactile cues, and Verbal cues Education comprehension: verbalized understanding, returned demonstration, verbal cues required, and tactile cues required  HOME EXERCISE PROGRAM: Access Code: [email or web address] URL: [email or web address] Date: 12/15/2021 Prepared by: [name]  Exercises - Long Sitting Quad Set  - 1 x daily - 7 x weekly - 2 sets - 10 reps - 5 hold - Active Straight Leg Raise with Quad Set  - 1 x daily - 7 x weekly - 2 sets - 10 reps - 5 hold - Mini Squat with Counter Support  - 1 x daily - 7 x weekly - 2 sets - 10 reps - 5 hold - Side Lunge with Counter Support  - 1 x daily - 7 x weekly - 2 sets - 10 reps - 5 hold - Seated Ankle Alphabet  - 1 x daily - 7 x weekly - 2 sets - 10 reps - 5 hold  Updated HEP: Access Code: [email or web address] URL: [email or web address] Date: 06/06/2022 Prepared by: [name]  Program Notes If you can all calf raises  should be performed on an elevated surface. The single leg calf raises will be standing Calf Raises with 4-5th toes off edge and standing Calf Pulses with 4-5th toes off edge (Med bridge didn't have these exercises). Seated calf raises will target the soleus. The bridges through your heels will target your glutes and hamstrings. To progress the exercises move your heels further out to create a longer lever arm, after you are comfortable with those you can more to sinle leg bridging.   Exercises - Standing Heel Raise with Toes Turned In  - 1 x daily - 7 x weekly - 3 sets - 10 reps - 2 hold - Single-Leg Calf Raise on Step  - 1 x daily - 7 x weekly - 3 sets - 12 reps - 2 hold - Single-Leg Calf Raise on Step Pulses  - 1 x daily - 7 x weekly - 3 sets - 10 reps - Seated Calf Raise with Weights on Thighs On Step  -  1 x daily - 7 x weekly - 3 sets - 12 reps - 2 hold - Lunge Matrix  - 1 x daily - 7 x weekly - 3 sets - 5 reps - Bridge on Heels  - 1 x daily - 7 x weekly - 3 sets - 8 reps - 2 hold   PT Short Term Goals       PT SHORT TERM GOAL #1   Title Patient will be independent in home exercise program to improve strength/mobility for better functional independence with ADLs.    Baseline 6/12; HEP given 7/20 : HEP compliant    Time 4    Status Achieved   Target Date 01/02/22      PT SHORT TERM GOAL #2   Title Patient will report a worst pain of 5/10 on VAS in R knee to improve tolerance with ADLs and reduced symptoms with activities.    Baseline 6/12: 8/10 7/20: 6/10 8/8: 5/10    Time 4    Period Weeks    Status MET   Target Date 01/02/22      PT SHORT TERM GOAL #3   Title Patient will increase 10 meter walk test to >1.15ms with LRAD as to improve gait speed for better community ambulation and to reduce fall risk.    Baseline 6/12:0.77 with crutches 7/20: 1.6 m/s    Time 4    Period Weeks    Status Achieved   Target Date 01/02/22      PT SHORT TERM GOAL #4   Title --    Baseline --     Time --    Period --    Status --    Target Date --      PT SHORT TERM GOAL #5   Title --    Baseline --    Time --    Period --    Status --    Target Date --              PT Long Term Goals      PT LONG TERM GOAL #1   Title Patient will increase FOTO score to equal to or greater than  71   to demonstrate statistically significant improvement in mobility and quality of life.    Baseline 6/12: 30 7/20: 51 8/8: 65% 8/31: 64% 10/5: 67%   Time 8    Period Weeks    Status Partially Met   Target Date 05/25/2022         PT LONG TERM GOAL #2   Title Patient will report a worst pain of 3/10 on VAS in  R knee  to improve tolerance with ADLs and reduced symptoms with activities.    Baseline 6/12: 8/10 pain 7/20: 6/10 8/8: 5/10 8/31: 6/10 10/5: 5/10    Time 8    Period Weeks    Status Partially Met   Target Date 05/25/2022         PT LONG TERM GOAL #3   Title Patient will increase lower extremity functional scale to >60/80 to demonstrate improved functional mobility and increased tolerance with ADLs.    Baseline 6/12: 19/80 7/20: 45/80 8/8: 49/80 8/31 55/80 10/5: 58/80   Time 8    Period Weeks    Status Partially Met   Target Date 05/25/2022         PT LONG TERM GOAL #4   Title Patient will improve R knee ROM (neutral to 120 degrees) for return to PLOF  and safe functional mobility.    Baseline 6/12: locked in extension 7/20: seated 85 supine AROM: 75    extension: AROM -5 PROM -2 8/8:  supine AROM 75 PROM 80;   seated: AROM 73 PROM 86 8/31: supine:AROM 75 PROM 84   sitting; AROM 79 PROM 92 10/5: 128 degrees   Time 8    Period Weeks    Status MET   Target Date 03/28/2022       PT LONG TERM GOAL #5   Title Patient will increase six minute walk test distance to >1000 for progression to community ambulator and improve gait ability    Baseline 6/12: unable to test 7/20: 1340 w/o brace 8/8: 1690 ft    Time 8    Period Weeks    Status MET   Target Date  03/28/2022     PT LONG TERM GOAL #6  Title Patient will return to sport and running 5 miles without pain increase for return to PLOF   Baseline 8/8: unable to play or run 8/31: unable to 10/5: not cleared for return to sport yet  Time 8   Period Weeks   Status On going  Target Date 05/25/2022           PT LONG TERM GOAL #7  Title Patient will increase BLE gross strength to 5/5 as to improve functional strength for independent gait, increased standing tolerance and increased ADL ability.  Baseline 10/5: see above  Time 8   Period Weeks   Status New  Target Date 05/25/2022             Plan       Clinical Impression Statement Patient presents with excellent motivation, able to return to running without pain increase. Jumping body mechanics have improved with decreased knee inversion this session. Shuffle speed is improving with decreased instability.    Patient will continue to benefit from skilled physical therapy interventions in order to improve right knee range of motion, strength, in order to return allow return to his previous level of function.   Personal Factors and Comorbidities Profession;Transportation    Examination-Activity Limitations Bathing;Bed Mobility;Bend;Caring for Others;Carry;Dressing;Hygiene/Grooming;Lift;Locomotion Level;Toileting;Stand;Stairs;Squat;Sit;Transfers    Examination-Participation Restrictions Cleaning;Community Activity;Driving;Meal Prep;Laundry;Occupation;Shop;Yard Work;Volunteer    Stability/Clinical Decision Making Stable/Uncomplicated    Rehab Potential Good    PT Frequency 2x / week    PT Duration 8 weeks    PT Treatment/Interventions ADLs/Self Care Home Management;Cryotherapy;Electrical Stimulation;Iontophoresis 4m/ml Dexamethasone;Moist Heat;Gait training;DME Instruction;Stair training;Functional mobility training;Therapeutic activities;Therapeutic exercise;Patient/family education;Neuromuscular re-education;Balance  training;Orthotic Fit/Training;Manual techniques;Passive range of motion;Scar mobilization;Manual lymph drainage;Dry needling;Energy conservation;Splinting;Taping    PT Next Visit Plan CREATE new updated HEP for patient.    PT Home Exercise Plan see above    Consulted and Agree with Plan of Care Patient             MJanna ArchPT  06/20/2022, 8:09 AM

## 2022-06-20 ENCOUNTER — Ambulatory Visit: Payer: 59

## 2022-06-20 DIAGNOSIS — M25661 Stiffness of right knee, not elsewhere classified: Secondary | ICD-10-CM | POA: Diagnosis not present

## 2022-06-20 DIAGNOSIS — R262 Difficulty in walking, not elsewhere classified: Secondary | ICD-10-CM

## 2022-06-20 DIAGNOSIS — M25561 Pain in right knee: Secondary | ICD-10-CM

## 2022-06-22 NOTE — Therapy (Signed)
OUTPATIENT PHYSICAL THERAPY TREATMENT NOTE  Patient Name: Flint Hakeem MRN: [id number] DOB:[birth year], 21 y.o., male Today's Date: 06/27/2022  PCP: Saddie Benders MD  REFERRING PROVIDER: Saddie Benders MD  PT End of Session - 06/27/22 0713     Visit Number 10    Number of Visits 27    Date for PT Re-Evaluation 07/18/21    Authorization Type [employer] Employee; PN 11/28    Authorization Time Period 01/31/22-03/28/22    Progress Note Due on Visit 30    PT Start Time 0715    PT Stop Time 0759    PT Time Calculation (min) 44 min    Activity Tolerance Patient tolerated treatment well    Behavior During Therapy [facility] for tasks assessed/performed               Past Medical History:  Diagnosis Date   Allergies    PONV (postoperative nausea and vomiting) 12/01/2021   pt's with extended recovery- vomiting x 3 hrs.   Past Surgical History:  Procedure Laterality Date   ADENOIDECTOMY AND MYRINGOTOMY WITH TUBE PLACEMENT Bilateral 2007   ANTERIOR CRUCIATE LIGAMENT REPAIR Right 12/01/2021   Procedure: RECONSTRUCTION ANTERIOR CRUCIATE LIGAMENT (ACL);  Surgeon: Hiram Gash, MD;  Location: [place];  Service: Orthopedics;  Laterality: Right;   KNEE ARTHROSCOPY Right 03/02/2022   Procedure: ARTHROSCOPY KNEE;  Surgeon: Hiram Gash, MD;  Location: [place];  Service: Orthopedics;  Laterality: Right;   LYSIS OF ADHESION Right 03/02/2022   Procedure: LYSIS OF ADHESION/MANIPULATION;  Surgeon: Hiram Gash, MD;  Location: [place];  Service: Orthopedics;  Laterality: Right;   There are no problems to display for this patient.   REFERRING DIAG: ACL reconstruction and arthroscopy of R knee  THERAPY DIAG:  Stiffness of right knee, not elsewhere classified  Difficulty in walking, not elsewhere classified  Acute pain of right knee  Rationale for Evaluation and Treatment Rehabilitation  PERTINENT HISTORY: Patient is a 21 year old male s/p ACL  reconstruction with meniscal repair 12/01/21. Patient was injured with a twisting injury while playing basketball where he felt a pop and was unable to bear weight. Protocol not provided however [name] has protocol from clinic: knee extension lock for one week, d/c brace by 4 weeks if quad control appropriate. Progress PROM AAROM and AROM as tolerated for 0-6 weeks; with limitation of 0-90 for first 4 weeks. Pt goes for manip under anesth on 03/02/22.   PRECAUTIONS: ACL protocol  SUBJECTIVE:  Patient did some jumping this weekend. No pain just weakness present. Wore his basketball shoes today   PAIN:  Are you having pain? Yes: NPRS scale: 2/10 Pain location: Rt Patella, quad   Pain description: post surgical Aggravating factors: movement Relieving factors: elevating, ice, rest  INTERVENTION   Right Left  Hip flexion 4- 4+  Hip Abduction 4- 4+  Hip Adduction 4- 4+  Knee Extension  4 4+  Knee Flexion 3+ 4  DF 5 5  PF 4+ 5   There Ex Warm up:  Forward and backward on stationary bike 5 min   Lay down on chest all the  way on ground, jump up 10x;  Resisted walking: forward/backwards 2x; each direction for 4x total #22.5  Activity Description: forward run to light, lateral tap, backwards jog back to home base tap;  Activity Setting:  The [facility] setting was selected for the goal of improving spatial awareness and visual scanning ability,  establishing a central point of reference during dynamic movements for enhanced balance and control.   Number of Pods:  6 Cycles/Sets:  2 Duration (Time or Hit Count):  10    Activity Description: lay flat; jump up forward run, lateral tap R or L pending side running on, backward shuffle to homebase to lay flat and tap with hands   Activity Setting:  The [facility] setting was selected for the goal of improving spatial awareness and visual scanning ability, establishing a central point of reference during dynamic movements for  enhanced balance and control.   Number of Pods:  5 Cycles/Sets:  10/2 sets Duration (Time or Hit Count):  10  Activity Description:Red: lateral shuffle to color lit up; red=10 jumps, blue =10 lunges; green 10 jumps  Activity Setting:  The [employer] setting was chosen to enhance cognitive processing and agility, providing an unpredictable environment to simulate real-world scenarios, and fostering quick reactions and adaptability.   Number of Pods:  5 Cycles/Sets:  10/x2 Duration (Time or Hit Count):  10    Ice cup massage x4 minutes  Trigger Point Dry Needling (TDN), unbilled Education performed with patient regarding potential benefit of TDN. Reviewed precautions and risks with patient. Reviewed special precautions/risks over lung fields which include pneumothorax. Reviewed signs and symptoms of pneumothorax and advised pt to go to ER immediately if these symptoms develop advise them of dry needling treatment. Extensive time spent with pt to ensure full understanding of TDN risks. Pt provided verbal consent to treatment. TDN performed to  with 0.3 x 60 single needle placements with local twitch response (LTR). Pistoning technique utilized. Improved pain-free motion following intervention.  R quad . X2 minutes    PATIENT EDUCATION: Education details: exercise technique, body mechanics, protocol Person educated: Patient Education method: Explanation, Demonstration, Tactile cues, and Verbal cues Education comprehension: verbalized understanding, returned demonstration, verbal cues required, and tactile cues required  HOME EXERCISE PROGRAM: Access Code: [email or web address] URL: [email or web address] Date: 12/15/2021 Prepared by: [name]  Exercises - Long Sitting Quad Set  - 1 x daily - 7 x weekly - 2 sets - 10 reps - 5 hold - Active Straight Leg Raise with Quad Set  - 1 x daily - 7 x weekly - 2 sets - 10 reps - 5 hold - Mini Squat with Counter Support  - 1 x daily - 7  x weekly - 2 sets - 10 reps - 5 hold - Side Lunge with Counter Support  - 1 x daily - 7 x weekly - 2 sets - 10 reps - 5 hold - Seated Ankle Alphabet  - 1 x daily - 7 x weekly - 2 sets - 10 reps - 5 hold  Updated HEP: Access Code: [email or web address] URL: [email or web address] Date: 06/06/2022 Prepared by: [name]  Program Notes If you can all calf raises should be performed on an elevated surface. The single leg calf raises will be standing Calf Raises with 4-5th toes off edge and standing Calf Pulses with 4-5th toes off edge (Med bridge didn't have these exercises). Seated calf raises will target the soleus. The bridges through your heels will target your glutes and hamstrings. To progress the exercises move your heels further out to create a longer lever arm, after you are comfortable with those you can more to sinle leg bridging.   Exercises - Standing Heel Raise with Toes Turned In  - 1 x daily - 7 x weekly - 3 sets - 10  reps - 2 hold - Single-Leg Calf Raise on Step  - 1 x daily - 7 x weekly - 3 sets - 12 reps - 2 hold - Single-Leg Calf Raise on Step Pulses  - 1 x daily - 7 x weekly - 3 sets - 10 reps - Seated Calf Raise with Weights on Thighs On Step  - 1 x daily - 7 x weekly - 3 sets - 12 reps - 2 hold - Lunge Matrix  - 1 x daily - 7 x weekly - 3 sets - 5 reps - Bridge on Heels  - 1 x daily - 7 x weekly - 3 sets - 8 reps - 2 hold   PT Short Term Goals       PT SHORT TERM GOAL #1   Title Patient will be independent in home exercise program to improve strength/mobility for better functional independence with ADLs.    Baseline 6/12; HEP given 7/20 : HEP compliant    Time 4    Status Achieved   Target Date 01/02/22      PT SHORT TERM GOAL #2   Title Patient will report a worst pain of 5/10 on VAS in R knee to improve tolerance with ADLs and reduced symptoms with activities.    Baseline 6/12: 8/10 7/20: 6/10 8/8: 5/10    Time 4    Period Weeks    Status MET   Target Date  01/02/22      PT SHORT TERM GOAL #3   Title Patient will increase 10 meter walk test to >1.43ms with LRAD as to improve gait speed for better community ambulation and to reduce fall risk.    Baseline 6/12:0.77 with crutches 7/20: 1.6 m/s    Time 4    Period Weeks    Status Achieved   Target Date 01/02/22      PT SHORT TERM GOAL #4   Title --    Baseline --    Time --    Period --    Status --    Target Date --      PT SHORT TERM GOAL #5   Title --    Baseline --    Time --    Period --    Status --    Target Date --              PT Long Term Goals      PT LONG TERM GOAL #1   Title Patient will increase FOTO score to equal to or greater than  71   to demonstrate statistically significant improvement in mobility and quality of life.    Baseline 6/12: 30 7/20: 51 8/8: 65% 8/31: 64% 10/5: 67%   Time 8    Period Weeks    Status Partially Met   Target Date 05/25/2022         PT LONG TERM GOAL #2   Title Patient will report a worst pain of 3/10 on VAS in  R knee  to improve tolerance with ADLs and reduced symptoms with activities.    Baseline 6/12: 8/10 pain 7/20: 6/10 8/8: 5/10 8/31: 6/10 10/5: 5/10    Time 8    Period Weeks    Status Partially Met   Target Date 05/25/2022         PT LONG TERM GOAL #3   Title Patient will increase lower extremity functional scale to >60/80 to demonstrate improved functional mobility and increased tolerance with ADLs.  Baseline 6/12: 19/80 7/20: 45/80 8/8: 49/80 8/31 55/80 10/5: 58/80   Time 8    Period Weeks    Status Partially Met   Target Date 05/25/2022         PT LONG TERM GOAL #4   Title Patient will improve R knee ROM (neutral to 120 degrees) for return to PLOF and safe functional mobility.    Baseline 6/12: locked in extension 7/20: seated 85 supine AROM: 75    extension: AROM -5 PROM -2 8/8:  supine AROM 75 PROM 80;   seated: AROM 73 PROM 86 8/31: supine:AROM 75 PROM 84   sitting; AROM 79 PROM 92 10/5: 128  degrees   Time 8    Period Weeks    Status MET   Target Date 03/28/2022       PT LONG TERM GOAL #5   Title Patient will increase six minute walk test distance to >1000 for progression to community ambulator and improve gait ability    Baseline 6/12: unable to test 7/20: 1340 w/o brace 8/8: 1690 ft    Time 8    Period Weeks    Status MET   Target Date 03/28/2022     PT LONG TERM GOAL #6  Title Patient will return to sport and running 5 miles without pain increase for return to PLOF   Baseline 8/8: unable to play or run 8/31: unable to 10/5: not cleared for return to sport yet  Time 8   Period Weeks   Status On going  Target Date 05/25/2022           PT LONG TERM GOAL #7  Title Patient will increase BLE gross strength to 5/5 as to improve functional strength for independent gait, increased standing tolerance and increased ADL ability.  Baseline 10/5: see above  Time 8   Period Weeks   Status New  Target Date 05/25/2022             Plan       Clinical Impression Statement Patient tolerated return to sport interventions well with minimal pain, only fatigue. Supine to standing in quick burst tolerated well. He is highly  motivated throughout session. He does fatigue with dynamic mobility interventions.    Patient will continue to benefit from skilled physical therapy interventions in order to improve right knee range of motion, strength, in order to return allow return to his previous level of function.   Personal Factors and Comorbidities Profession;Transportation    Examination-Activity Limitations Bathing;Bed Mobility;Bend;Caring for Others;Carry;Dressing;Hygiene/Grooming;Lift;Locomotion Level;Toileting;Stand;Stairs;Squat;Sit;Transfers    Examination-Participation Restrictions Cleaning;Community Activity;Driving;Meal Prep;Laundry;Occupation;Shop;Yard Work;Volunteer    Stability/Clinical Decision Making Stable/Uncomplicated    Rehab Potential Good    PT Frequency  2x / week    PT Duration 8 weeks    PT Treatment/Interventions ADLs/Self Care Home Management;Cryotherapy;Electrical Stimulation;Iontophoresis 63m/ml Dexamethasone;Moist Heat;Gait training;DME Instruction;Stair training;Functional mobility training;Therapeutic activities;Therapeutic exercise;Patient/family education;Neuromuscular re-education;Balance training;Orthotic Fit/Training;Manual techniques;Passive range of motion;Scar mobilization;Manual lymph drainage;Dry needling;Energy conservation;Splinting;Taping    PT Next Visit Plan CREATE new updated HEP for patient.    PT Home Exercise Plan see above    Consulted and Agree with Plan of Care Patient             MJanna ArchPT  06/27/2022, 7:59 AM

## 2022-06-27 ENCOUNTER — Ambulatory Visit: Payer: 59 | Attending: Orthopaedic Surgery

## 2022-06-27 DIAGNOSIS — M25661 Stiffness of right knee, not elsewhere classified: Secondary | ICD-10-CM | POA: Diagnosis not present

## 2022-06-27 DIAGNOSIS — R262 Difficulty in walking, not elsewhere classified: Secondary | ICD-10-CM

## 2022-06-27 DIAGNOSIS — M25561 Pain in right knee: Secondary | ICD-10-CM | POA: Diagnosis not present

## 2022-06-28 NOTE — Therapy (Signed)
OUTPATIENT PHYSICAL THERAPY TREATMENT NOTE / Physical Therapy Progress Note   Dates of reporting period  05/23/22   to   06/29/22  Patient Name: Douglas Adams MRN: [id number] DOB:02-15-2001, 22 y.o., male Today's Date: 06/29/2022  PCP: Saddie Benders MD  REFERRING PROVIDER: Saddie Benders MD   PT End of Session - 06/29/22 0717     Visit Number 50    Number of Visits 16    Date for PT Re-Evaluation 07/18/21    Authorization Type [employer] Employee; PN 11/28    Authorization Time Period 01/31/22-03/28/22    Progress Note Due on Visit 30    PT Start Time 0715    PT Stop Time 0759    PT Time Calculation (min) 44 min    Activity Tolerance Patient tolerated treatment well    Behavior During Therapy [facility] for tasks assessed/performed              Past Medical History:  Diagnosis Date   Allergies    PONV (postoperative nausea and vomiting) 12/01/2021   pt's with extended recovery- vomiting x 3 hrs.   Past Surgical History:  Procedure Laterality Date   ADENOIDECTOMY AND MYRINGOTOMY WITH TUBE PLACEMENT Bilateral 2007   ANTERIOR CRUCIATE LIGAMENT REPAIR Right 12/01/2021   Procedure: RECONSTRUCTION ANTERIOR CRUCIATE LIGAMENT (ACL);  Surgeon: Hiram Gash, MD;  Location: [place];  Service: Orthopedics;  Laterality: Right;   KNEE ARTHROSCOPY Right 03/02/2022   Procedure: ARTHROSCOPY KNEE;  Surgeon: Hiram Gash, MD;  Location: [place];  Service: Orthopedics;  Laterality: Right;   LYSIS OF ADHESION Right 03/02/2022   Procedure: LYSIS OF ADHESION/MANIPULATION;  Surgeon: Hiram Gash, MD;  Location: [place];  Service: Orthopedics;  Laterality: Right;   There are no problems to display for this patient.  REFERRING DIAG: ACL reconstruction and arthroscopy of R knee  THERAPY DIAG:  Stiffness of right knee, not elsewhere classified  Difficulty in walking, not elsewhere classified  Acute pain of right knee  Rationale for Evaluation  and Treatment Rehabilitation  PERTINENT HISTORY: Patient is a 22 year old male s/p ACL reconstruction with meniscal repair 12/01/21. Patient was injured with a twisting injury while playing basketball where he felt a pop and was unable to bear weight. Protocol not provided however [name] has protocol from clinic: knee extension lock for one week, d/c brace by 4 weeks if quad control appropriate. Progress PROM AAROM and AROM as tolerated for 0-6 weeks; with limitation of 0-90 for first 4 weeks. Pt goes for manip under anesth on 03/02/22.   PRECAUTIONS: ACL protocol  SUBJECTIVE:  Patient sees physician tomorrow. Reports he is 80% back to baseline.   PAIN:  Are you having pain? Yes: NPRS scale: 1/10 Pain location: Rt Patella, quad   Pain description: post surgical Aggravating factors: movement Relieving factors: elevating, ice, rest  INTERVENTION   Right Left  Hip flexion 5 5  Hip Abduction 5 5  Hip Adduction 4+ 4+  Knee Extension  4+ 5  Knee Flexion 4+ 5  DF 5 5  PF 4+  5   TREATMENT in well zone:  Bike Warm Up: - Forward/backwards cycle x 3 minutes  Return to Sport Testing: - Single Leg Hops  Right:  32. 41.5, 42= 38.5 avg Left: 79.5, 82, 82.5 = 81.3  - Y-Test  Right leg: Forward (34 in), Behind Leg (38 in), Lateral (33.5 in) - Average (35.2 in)  Left leg: Forward ( 36.5in), Behind  leg ( 40in), Lateral ( 38 in) - Average (38.2)  Return to Running:  - Walk 2 minutes warm-up at 3.5 - run .8 miles; at 5.5 and 5.8 mph   *Patient reported an increase in (R) anterior knee pain following  run*   Trigger Point Dry Needling (TDN), unbilled Education performed with patient regarding potential benefit of TDN. Reviewed precautions and risks with patient. Reviewed special precautions/risks over lung fields which include pneumothorax. Reviewed signs and symptoms of pneumothorax and advised pt to go to ER immediately if these symptoms develop advise them of dry needling treatment.  Extensive time spent with pt to ensure full understanding of TDN risks. Pt provided verbal consent to treatment. TDN performed to  with 0.25 x 40 single needle placements with local twitch response (LTR). Pistoning technique utilized. Improved pain-free motion following intervention.  R quad x 2 minutes.   Ice cup massage x 4 minutes to R knee  PATIENT EDUCATION: Education details: exercise technique, body mechanics, protocol Person educated: Patient Education method: Explanation, Demonstration, Tactile cues, and Verbal cues Education comprehension: verbalized understanding, returned demonstration, verbal cues required, and tactile cues required  HOME EXERCISE PROGRAM: Access Code: [email or web address] URL: [email or web address] Date: 12/15/2021 Prepared by: [name]  Exercises- Long Sitting Quad Set  - 1 x daily - 7 x weekly - 2 sets - 10 reps - 5 hold - Active Straight Leg Raise with Quad Set  - 1 x daily - 7 x weekly - 2 sets - 10 reps - 5 hold - Mini Squat with Counter Support  - 1 x daily - 7 x weekly - 2 sets - 10 reps - 5 hold - Side Lunge with Counter Support  - 1 x daily - 7 x weekly - 2 sets - 10 reps - 5 hold - Seated Ankle Alphabet  - 1 x daily - 7 x weekly - 2 sets - 10 reps - 5 hold   PT Short Term Goals       PT SHORT TERM GOAL #1   Title Patient will be independent in home exercise program to improve strength/mobility for better functional independence with ADLs.    Baseline 6/12; HEP given 7/20 : HEP compliant    Time 4    Status Achieved   Target Date 01/02/22      PT SHORT TERM GOAL #2   Title Patient will report a worst pain of 5/10 on VAS in R knee to improve tolerance with ADLs and reduced symptoms with activities.    Baseline 6/12: 8/10 7/20: 6/10 8/8: 5/10    Time 4    Period Weeks    Status MET   Target Date 01/02/22      PT SHORT TERM GOAL #3   Title Patient will increase 10 meter walk test to >1.21ms with LRAD as to improve gait speed for  better community ambulation and to reduce fall risk.    Baseline 6/12:0.77 with crutches 7/20: 1.6 m/s    Time 4    Period Weeks    Status Achieved   Target Date 01/02/22      PT SHORT TERM GOAL #4   Title --    Baseline --    Time --    Period --    Status --    Target Date --      PT SHORT TERM GOAL #5   Title --    Baseline --    Time --    Period --  Status --    Target Date --              PT Long Term Goals      PT LONG TERM GOAL #1   Title Patient will increase FOTO score to equal to or greater than  71   to demonstrate statistically significant improvement in mobility and quality of life.    Baseline 6/12: 30 7/20: 51 8/8: 65% 8/31: 64% 10/5: 67%   Time 8 11/28: 71% 75%    Period Weeks    Status  Met   Target Date 07/18/2022           PT LONG TERM GOAL #2   Title Patient will report a worst pain of 3/10 on VAS in  R knee  to improve tolerance with ADLs and reduced symptoms with activities.    Baseline 6/12: 8/10 pain 7/20: 6/10 8/8: 5/10 8/31: 6/10 10/5: 5/10 11/28: 5/10 1/4: 4/10   Time 8    Period Weeks    Status Partially Met   Target Date 07/18/2022           PT LONG TERM GOAL #3   Title Patient will increase lower extremity functional scale to >60/80 to demonstrate improved functional mobility and increased tolerance with ADLs.    Baseline 6/12: 19/80 7/20: 45/80 8/8: 49/80 8/31 55/80 10/5: 58/80 11/28: 66/80   Time 8    Period Weeks    Status  Met   Target Date 05/25/2022         PT LONG TERM GOAL #4   Title Patient will improve R knee ROM (neutral to 120 degrees) for return to PLOF and safe functional mobility.    Baseline 6/12: locked in extension 7/20: seated 85 supine AROM: 75    extension: AROM -5 PROM -2 8/8:  supine AROM 75 PROM 80;   seated: AROM 73 PROM 86 8/31: supine:AROM 75 PROM 84   sitting; AROM 79 PROM 92 10/5: 128 degrees   Time 8    Period Weeks    Status MET   Target Date 03/28/2022       PT LONG TERM  GOAL #5   Title Patient will increase six minute walk test distance to >1000 for progression to community ambulator and improve gait ability    Baseline 6/12: unable to test 7/20: 1340 w/o brace 8/8: 1690 ft    Time 8    Period Weeks    Status MET   Target Date 03/28/2022     PT LONG TERM GOAL #6  Title Patient will return to sport and running 5 miles without pain increase for return to PLOF   Baseline 8/8: unable to play or run 8/31: unable to 10/5: not cleared for return to sport yet 11/28: Patient was able to run 0.5 mile without stopping  1/4: 0.8 miles without stopping  Time 8   Period Weeks   Status On going  Target Date 07/18/2022             PT LONG TERM GOAL #7  Title Patient will increase BLE gross strength to 5/5 as to improve functional strength for independent gait, increased standing tolerance and increased ADL ability.  Baseline 10/5: see above 11/28: see above   Time 8   Period Weeks   Status Partially Met  Target Date 07/18/2022           PT LONG TERM GOAL #8  Title Patient will increase single leg hop test on  the (R) leg to an average of > 75 in to demonstrate symmetry between legs and the ability to hop in order to return to sport with full confidence.   Baseline 11/28: Right: 37.6, 39, 40 in (Average: 38.8 in) 1/4; 32. 41.5, 42 (average 38.5)  Time 8  Period Weeks   Status Partially Met  Target Date 07/18/2022       PT LONG TERM GOAL #9  Title Patient will increase Y-test on the (R) leg to an average of > 31.5 in to demonstrate symmetry between legs and increased ability to maintain single leg balance.    Baseline 11/28:(R) Forward (23.25 in), Behind Leg (31 in), Lateral (33 in), Average (29 in) 1/4: Right leg: Forward (34 in), Behind Leg (38 in), Lateral (33.5 in) Average (35.2 in)  Time 8  Period Weeks   Status MET  Target Date 07/18/2022        Plan       Clinical Impression Statement Patient has met his Y test and FOTO goal. HE is  able to jump further this session however his average jump length is decreased due to patient fear of jumping initially requiring encouragement and cueing for correct body mechanics. Patient encouraged to trust leg and continue focus on quad activation. His running is improving but not yet at return to sport level. Patient's condition has the potential to improve in response to therapy. Maximum improvement is yet to be obtained. The anticipated improvement is attainable and reasonable in a generally predictable time.   Patient will continue to benefit from skilled physical therapy interventions in order to improve right knee strength, overall confidence in sport activities, and overall functional capability of the RLE in order to return allow return to his previous level of function.   Personal Factors and Comorbidities Profession;Transportation    Examination-Activity Limitations Bathing;Bed Mobility;Bend;Caring for Others;Carry;Dressing;Hygiene/Grooming;Lift;Locomotion Level;Toileting;Stand;Stairs;Squat;Sit;Transfers    Examination-Participation Restrictions Cleaning;Community Activity;Driving;Meal Prep;Laundry;Occupation;Shop;Yard Work;Volunteer    Stability/Clinical Decision Making Stable/Uncomplicated    Rehab Potential Good    PT Frequency 2x / week    PT Duration 8 weeks    PT Treatment/Interventions ADLs/Self Care Home Management;Cryotherapy;Electrical Stimulation;Iontophoresis 61m/ml Dexamethasone;Moist Heat;Gait training;DME Instruction;Stair training;Functional mobility training;Therapeutic activities;Therapeutic exercise;Patient/family education;Neuromuscular re-education;Balance training;Orthotic Fit/Training;Manual techniques;Passive range of motion;Scar mobilization;Manual lymph drainage;Dry needling;Energy conservation;Splinting;Taping    PT Next Visit Plan    PT Home Exercise Plan see above    Consulted and Agree with Plan of Care Patient             MJanna ArchPT  06/29/2022,  8:15 AM

## 2022-06-29 ENCOUNTER — Ambulatory Visit: Payer: 59 | Attending: Orthopaedic Surgery

## 2022-06-29 DIAGNOSIS — R262 Difficulty in walking, not elsewhere classified: Secondary | ICD-10-CM | POA: Diagnosis not present

## 2022-06-29 DIAGNOSIS — M25661 Stiffness of right knee, not elsewhere classified: Secondary | ICD-10-CM | POA: Diagnosis not present

## 2022-06-29 DIAGNOSIS — M25561 Pain in right knee: Secondary | ICD-10-CM | POA: Diagnosis not present

## 2022-06-30 DIAGNOSIS — S83281D Other tear of lateral meniscus, current injury, right knee, subsequent encounter: Secondary | ICD-10-CM | POA: Diagnosis not present

## 2022-07-03 NOTE — Therapy (Signed)
OUTPATIENT PHYSICAL THERAPY TREATMENT NOTE  Patient Name: Douglas Adams MRN: [id number] DOB:09-28-2000, 22 y.o., male Today's Date: 07/04/2022  PCP: Saddie Benders MD  REFERRING PROVIDER: Saddie Benders MD   PT End of Session - 07/04/22 0716     Visit Number 51    Number of Visits 53    Date for PT Re-Evaluation 07/18/21    Authorization Type [employer] Employee; PN 11/28    Authorization Time Period 01/31/22-03/28/22    Progress Note Due on Visit 30    PT Start Time 0715    PT Stop Time 0759    PT Time Calculation (min) 44 min    Activity Tolerance Patient tolerated treatment well    Behavior During Therapy [facility] for tasks assessed/performed               Past Medical History:  Diagnosis Date   Allergies    PONV (postoperative nausea and vomiting) 12/01/2021   pt's with extended recovery- vomiting x 3 hrs.   Past Surgical History:  Procedure Laterality Date   ADENOIDECTOMY AND MYRINGOTOMY WITH TUBE PLACEMENT Bilateral 2007   ANTERIOR CRUCIATE LIGAMENT REPAIR Right 12/01/2021   Procedure: RECONSTRUCTION ANTERIOR CRUCIATE LIGAMENT (ACL);  Surgeon: Hiram Gash, MD;  Location: [place];  Service: Orthopedics;  Laterality: Right;   KNEE ARTHROSCOPY Right 03/02/2022   Procedure: ARTHROSCOPY KNEE;  Surgeon: Hiram Gash, MD;  Location: [place];  Service: Orthopedics;  Laterality: Right;   LYSIS OF ADHESION Right 03/02/2022   Procedure: LYSIS OF ADHESION/MANIPULATION;  Surgeon: Hiram Gash, MD;  Location: [place];  Service: Orthopedics;  Laterality: Right;   There are no problems to display for this patient.  REFERRING DIAG: ACL reconstruction and arthroscopy of R knee  THERAPY DIAG:  Stiffness of right knee, not elsewhere classified  Difficulty in walking, not elsewhere classified  Acute pain of right knee  Rationale for Evaluation and Treatment Rehabilitation  PERTINENT HISTORY: Patient is a 22 year old male s/p ACL  reconstruction with meniscal repair 12/01/21. Patient was injured with a twisting injury while playing basketball where he felt a pop and was unable to bear weight. Protocol not provided however [name] has protocol from clinic: knee extension lock for one week, d/c brace by 4 weeks if quad control appropriate. Progress PROM AAROM and AROM as tolerated for 0-6 weeks; with limitation of 0-90 for first 4 weeks. Pt goes for manip under anesth on 03/02/22.   PRECAUTIONS: ACL protocol  SUBJECTIVE:  Patient reports physician wants him to increase his strength training between PT sessions, is pleased with his progress.   PAIN:  Are you having pain? Yes: NPRS scale: 1/10 Pain location: Rt Patella, quad   Pain description: post surgical Aggravating factors: movement Relieving factors: elevating, ice, rest  INTERVENTION  There Ex Warm up:  Forward and backward on stationary bike 5 min    Leg Press:  Bilateral LE #85; 12x; 2 sets; cues for decreasing velocity of recruitment Single LE #40; 12x; 2 sets; lateral cueing   Weighted calf raise #9 dumbbells in each hand 15x Single limb calf raise 15x each LE; UE support 15lb KB: RDL to squat 15x      Activity Description: lay flat; jump up forward run, lateral tap R or L pending side running on, backward shuffle to homebase to lay flat and tap with hands   Activity Setting:  The [facility] setting was selected for the goal of  improving spatial awareness and visual scanning ability, establishing a central point of reference during dynamic movements for enhanced balance and control.    Number of Pods:  5 Cycles/Sets:  10/2 sets Duration (Time or Hit Count):  10          Ice cup massage x4 minutes   Trigger Point Dry Needling (TDN), unbilled Education performed with patient regarding potential benefit of TDN. Reviewed precautions and risks with patient. Reviewed special precautions/risks over lung fields which include pneumothorax. Reviewed  signs and symptoms of pneumothorax and advised pt to go to ER immediately if these symptoms develop advise them of dry needling treatment. Extensive time spent with pt to ensure full understanding of TDN risks. Pt provided verbal consent to treatment. TDN performed to  with 0.25 x 40 single needle placements with local twitch response (LTR). Pistoning technique utilized. Improved pain-free motion following intervention.  R quad x 2 minutes.     PATIENT EDUCATION: Education details: exercise technique, body mechanics, protocol Person educated: Patient Education method: Explanation, Demonstration, Tactile cues, and Verbal cues Education comprehension: verbalized understanding, returned demonstration, verbal cues required, and tactile cues required  HOME EXERCISE PROGRAM: Access Code: [email or web address] URL: [email or web address] Date: 12/15/2021 Prepared by: [name]  Exercises- Long Sitting Quad Set  - 1 x daily - 7 x weekly - 2 sets - 10 reps - 5 hold - Active Straight Leg Raise with Quad Set  - 1 x daily - 7 x weekly - 2 sets - 10 reps - 5 hold - Mini Squat with Counter Support  - 1 x daily - 7 x weekly - 2 sets - 10 reps - 5 hold - Side Lunge with Counter Support  - 1 x daily - 7 x weekly - 2 sets - 10 reps - 5 hold - Seated Ankle Alphabet  - 1 x daily - 7 x weekly - 2 sets - 10 reps - 5 hold   PT Short Term Goals       PT SHORT TERM GOAL #1   Title Patient will be independent in home exercise program to improve strength/mobility for better functional independence with ADLs.    Baseline 6/12; HEP given 7/20 : HEP compliant    Time 4    Status Achieved   Target Date 01/02/22      PT SHORT TERM GOAL #2   Title Patient will report a worst pain of 5/10 on VAS in R knee to improve tolerance with ADLs and reduced symptoms with activities.    Baseline 6/12: 8/10 7/20: 6/10 8/8: 5/10    Time 4    Period Weeks    Status MET   Target Date 01/02/22      PT SHORT TERM GOAL #3    Title Patient will increase 10 meter walk test to >1.3m/s with LRAD as to improve gait speed for better community ambulation and to reduce fall risk.    Baseline 6/12:0.77 with crutches 7/20: 1.6 m/s    Time 4    Period Weeks    Status Achieved   Target Date 01/02/22      PT SHORT TERM GOAL #4   Title --    Baseline --    Time --    Period --    Status --    Target Date --      PT SHORT TERM GOAL #5   Title --    Baseline --    Time --    Period --  Status --    Target Date --              PT Long Term Goals      PT LONG TERM GOAL #1   Title Patient will increase FOTO score to equal to or greater than  71   to demonstrate statistically significant improvement in mobility and quality of life.    Baseline 6/12: 30 7/20: 51 8/8: 65% 8/31: 64% 10/5: 67%   Time 8 11/28: 71% 75%    Period Weeks    Status  Met   Target Date 07/18/2022           PT LONG TERM GOAL #2   Title Patient will report a worst pain of 3/10 on VAS in  R knee  to improve tolerance with ADLs and reduced symptoms with activities.    Baseline 6/12: 8/10 pain 7/20: 6/10 8/8: 5/10 8/31: 6/10 10/5: 5/10 11/28: 5/10 1/4: 4/10   Time 8    Period Weeks    Status Partially Met   Target Date 07/18/2022           PT LONG TERM GOAL #3   Title Patient will increase lower extremity functional scale to >60/80 to demonstrate improved functional mobility and increased tolerance with ADLs.    Baseline 6/12: 19/80 7/20: 45/80 8/8: 49/80 8/31 55/80 10/5: 58/80 11/28: 66/80   Time 8    Period Weeks    Status  Met   Target Date 05/25/2022         PT LONG TERM GOAL #4   Title Patient will improve R knee ROM (neutral to 120 degrees) for return to PLOF and safe functional mobility.    Baseline 6/12: locked in extension 7/20: seated 85 supine AROM: 75    extension: AROM -5 PROM -2 8/8:  supine AROM 75 PROM 80;   seated: AROM 73 PROM 86 8/31: supine:AROM 75 PROM 84   sitting; AROM 79 PROM 92 10/5: 128  degrees   Time 8    Period Weeks    Status MET   Target Date 03/28/2022       PT LONG TERM GOAL #5   Title Patient will increase six minute walk test distance to >1000 for progression to community ambulator and improve gait ability    Baseline 6/12: unable to test 7/20: 1340 w/o brace 8/8: 1690 ft    Time 8    Period Weeks    Status MET   Target Date 03/28/2022     PT LONG TERM GOAL #6  Title Patient will return to sport and running 5 miles without pain increase for return to PLOF   Baseline 8/8: unable to play or run 8/31: unable to 10/5: not cleared for return to sport yet 11/28: Patient was able to run 0.5 mile without stopping  1/4: 0.8 miles without stopping  Time 8   Period Weeks   Status On going  Target Date 07/18/2022             PT LONG TERM GOAL #7  Title Patient will increase BLE gross strength to 5/5 as to improve functional strength for independent gait, increased standing tolerance and increased ADL ability.  Baseline 10/5: see above 11/28: see above   Time 8   Period Weeks   Status Partially Met  Target Date 07/18/2022           PT LONG TERM GOAL #8  Title Patient will increase single leg hop test on  the (R) leg to an average of > 75 in to demonstrate symmetry between legs and the ability to hop in order to return to sport with full confidence.   Baseline 11/28: Right: 37.6, 39, 40 in (Average: 38.8 in) 1/4; 32. 41.5, 42 (average 38.5)  Time 8  Period Weeks   Status Partially Met  Target Date 07/18/2022       PT LONG TERM GOAL #9  Title Patient will increase Y-test on the (R) leg to an average of > 31.5 in to demonstrate symmetry between legs and increased ability to maintain single leg balance.    Baseline 11/28:(R) Forward (23.25 in), Behind Leg (31 in), Lateral (33 in), Average (29 in) 1/4: Right leg: Forward (34 in), Behind Leg (38 in), Lateral (33.5 in) Average (35.2 in)  Time 8  Period Weeks   Status MET  Target Date 07/18/2022         Plan       Clinical Impression Statement Focused strengthening in addition to return to sport interventions added to POC. Patient highly motivated but does demonstrate fatigue with repeated muscle activation. Patient will continue to benefit from skilled physical therapy interventions in order to improve right knee strength, overall confidence in sport activities, and overall functional capability of the RLE in order to return allow return to his previous level of function.   Personal Factors and Comorbidities Profession;Transportation    Examination-Activity Limitations Bathing;Bed Mobility;Bend;Caring for Others;Carry;Dressing;Hygiene/Grooming;Lift;Locomotion Level;Toileting;Stand;Stairs;Squat;Sit;Transfers    Examination-Participation Restrictions Cleaning;Community Activity;Driving;Meal Prep;Laundry;Occupation;Shop;Yard Work;Volunteer    Stability/Clinical Decision Making Stable/Uncomplicated    Rehab Potential Good    PT Frequency 2x / week    PT Duration 8 weeks    PT Treatment/Interventions ADLs/Self Care Home Management;Cryotherapy;Electrical Stimulation;Iontophoresis 4mg /ml Dexamethasone;Moist Heat;Gait training;DME Instruction;Stair training;Functional mobility training;Therapeutic activities;Therapeutic exercise;Patient/family education;Neuromuscular re-education;Balance training;Orthotic Fit/Training;Manual techniques;Passive range of motion;Scar mobilization;Manual lymph drainage;Dry needling;Energy conservation;Splinting;Taping    PT Next Visit Plan    PT Home Exercise Plan see above    Consulted and Agree with Plan of Care Patient             PT  07/04/2022, 8:03 AM

## 2022-07-04 ENCOUNTER — Ambulatory Visit: Payer: 59 | Attending: Pediatrics

## 2022-07-04 DIAGNOSIS — M25661 Stiffness of right knee, not elsewhere classified: Secondary | ICD-10-CM | POA: Insufficient documentation

## 2022-07-04 DIAGNOSIS — M25561 Pain in right knee: Secondary | ICD-10-CM | POA: Diagnosis not present

## 2022-07-04 DIAGNOSIS — R262 Difficulty in walking, not elsewhere classified: Secondary | ICD-10-CM | POA: Insufficient documentation

## 2022-07-05 NOTE — Therapy (Signed)
OUTPATIENT PHYSICAL THERAPY TREATMENT NOTE  Patient Name: Douglas Adams MRN: [id number] DOB:[birth year], 22 y.o., male Today's Date: 07/06/2022  PCP: Lucio Edward MD  REFERRING PROVIDER: Lucio Edward MD   PT End of Session - 07/06/22 0804     Visit Number 52    Number of Visits 56    Date for PT Re-Evaluation 07/18/21    Authorization Type [employer] Employee; PN 11/28    Authorization Time Period 01/31/22-03/28/22    Progress Note Due on Visit 30    PT Start Time 0800    PT Stop Time 0844    PT Time Calculation (min) 44 min    Activity Tolerance Patient tolerated treatment well    Behavior During Therapy [facility] for tasks assessed/performed                Past Medical History:  Diagnosis Date   Allergies    PONV (postoperative nausea and vomiting) 12/01/2021   pt's with extended recovery- vomiting x 3 hrs.   Past Surgical History:  Procedure Laterality Date   ADENOIDECTOMY AND MYRINGOTOMY WITH TUBE PLACEMENT Bilateral 2007   ANTERIOR CRUCIATE LIGAMENT REPAIR Right 12/01/2021   Procedure: RECONSTRUCTION ANTERIOR CRUCIATE LIGAMENT (ACL);  Surgeon: Bjorn Pippin, MD;  Location: [facility];  Service: Orthopedics;  Laterality: Right;   KNEE ARTHROSCOPY Right 03/02/2022   Procedure: ARTHROSCOPY KNEE;  Surgeon: Bjorn Pippin, MD;  Location: [facility];  Service: Orthopedics;  Laterality: Right;   LYSIS OF ADHESION Right 03/02/2022   Procedure: LYSIS OF ADHESION/MANIPULATION;  Surgeon: Bjorn Pippin, MD;  Location: [facility];  Service: Orthopedics;  Laterality: Right;   There are no problems to display for this patient.  REFERRING DIAG: ACL reconstruction and arthroscopy of R knee  THERAPY DIAG:  Stiffness of right knee, not elsewhere classified  Difficulty in walking, not elsewhere classified  Acute pain of right knee  Rationale for Evaluation and Treatment Rehabilitation  PERTINENT HISTORY: Patient is a 22 year old male s/p  ACL reconstruction with meniscal repair 12/01/21. Patient was injured with a twisting injury while playing basketball where he felt a pop and was unable to bear weight. Protocol not provided however Thereasa Parkin has protocol from clinic: knee extension lock for one week, d/c brace by 4 weeks if quad control appropriate. Progress PROM AAROM and AROM as tolerated for 0-6 weeks; with limitation of 0-90 for first 4 weeks. Pt goes for manip under anesth on 03/02/22.   PRECAUTIONS: ACL protocol  SUBJECTIVE:  Patient reports some stiffness but limited pain, more soreness.Has been compliant with HEP.   PAIN:  Are you having pain? Yes: NPRS scale: 3/10 Pain location: Rt Patella, quad   Pain description: post surgical Aggravating factors: movement Relieving factors: elevating, ice, rest  INTERVENTION   Return to run protocol: - Walk 3 mps 1 minute 3x; jog 6.0 mps 1 minute x 3 trials  *Patient required cues to prevent from (B) feet from shifting outward and to promote more knee flexion in their (R) knee*  Strengthening : superset Leg Press: ; first round #4 plate 78G for warmup. 2nd round #7 plate 8x,  Single leg calf raises leaning against wall with arm switching legs (Target Lateral and Medial Calf): 3x12  Single Leg Hopping: x2 rounds Rear foot elevated single leg hops forward and backward (30 seconds); over line Rear foot elevated single leg loop lateral (30 seconds); over line Floating heel split squats 12x ea Single leg STS followed  by small single leg hop 8x ea  Cutting Control and Return to sprinting:  Patient leans forward against therapist, let go, and have patient cut laterally and sprint (PT standing on planting leg side)   Activity Description: lay flat; jump up forward run, lateral tap R or L pending side running on, backward shuffle to homebase to lay flat and tap with hands   Activity Setting:  The [facility] setting was selected for the goal of improving spatial awareness and  visual scanning ability, establishing a central point of reference during dynamic movements for enhanced balance and control.    Number of Pods:  5 Cycles/Sets:  10/2 sets Duration (Time or Hit Count):  10  Ice cup massage 3 minutes   Trigger Point Dry Needling (TDN), unbilled Education performed with patient regarding potential benefit of TDN. Reviewed precautions and risks with patient. Reviewed special precautions/risks over lung fields which include pneumothorax. Reviewed signs and symptoms of pneumothorax and advised pt to go to ER immediately if these symptoms develop advise them of dry needling treatment. Extensive time spent with pt to ensure full understanding of TDN risks. Pt provided verbal consent to treatment. TDN performed to  with 0.25 x 40 single needle placements with local twitch response (LTR). Pistoning technique utilized. Improved pain-free motion following intervention. R quad 3 minutes     PATIENT EDUCATION: Education details: exercise technique, body mechanics, protocol Person educated: Patient Education method: Explanation, Demonstration, Tactile cues, and Verbal cues Education comprehension: verbalized understanding, returned demonstration, verbal cues required, and tactile cues required  HOME EXERCISE PROGRAM: Access Code: [email or web address] URL: [email or web address] Date: 12/15/2021 Prepared by: [name]  Exercises- Long Sitting Quad Set  - 1 x daily - 7 x weekly - 2 sets - 10 reps - 5 hold - Active Straight Leg Raise with Quad Set  - 1 x daily - 7 x weekly - 2 sets - 10 reps - 5 hold - Mini Squat with Counter Support  - 1 x daily - 7 x weekly - 2 sets - 10 reps - 5 hold - Side Lunge with Counter Support  - 1 x daily - 7 x weekly - 2 sets - 10 reps - 5 hold - Seated Ankle Alphabet  - 1 x daily - 7 x weekly - 2 sets - 10 reps - 5 hold   PT Short Term Goals       PT SHORT TERM GOAL #1   Title Patient will be independent in home exercise program  to improve strength/mobility for better functional independence with ADLs.    Baseline 6/12; HEP given 7/20 : HEP compliant    Time 4    Status Achieved   Target Date 01/02/22      PT SHORT TERM GOAL #2   Title Patient will report a worst pain of 5/10 on VAS in R knee to improve tolerance with ADLs and reduced symptoms with activities.    Baseline 6/12: 8/10 7/20: 6/10 8/8: 5/10    Time 4    Period Weeks    Status MET   Target Date 01/02/22      PT SHORT TERM GOAL #3   Title Patient will increase 10 meter walk test to >1.2m/s with LRAD as to improve gait speed for better community ambulation and to reduce fall risk.    Baseline 6/12:0.77 with crutches 7/20: 1.6 m/s    Time 4    Period Weeks    Status Achieved   Target  Date 01/02/22      PT SHORT TERM GOAL #4   Title --    Baseline --    Time --    Period --    Status --    Target Date --      PT SHORT TERM GOAL #5   Title --    Baseline --    Time --    Period --    Status --    Target Date --              PT Long Term Goals      PT LONG TERM GOAL #1   Title Patient will increase FOTO score to equal to or greater than  71   to demonstrate statistically significant improvement in mobility and quality of life.    Baseline 6/12: 30 7/20: 51 8/8: 65% 8/31: 64% 10/5: 67%   Time 8 11/28: 71% 75%    Period Weeks    Status  Met   Target Date 07/18/2022           PT LONG TERM GOAL #2   Title Patient will report a worst pain of 3/10 on VAS in  R knee  to improve tolerance with ADLs and reduced symptoms with activities.    Baseline 6/12: 8/10 pain 7/20: 6/10 8/8: 5/10 8/31: 6/10 10/5: 5/10 11/28: 5/10 1/4: 4/10   Time 8    Period Weeks    Status Partially Met   Target Date 07/18/2022           PT LONG TERM GOAL #3   Title Patient will increase lower extremity functional scale to >60/80 to demonstrate improved functional mobility and increased tolerance with ADLs.    Baseline 6/12: 19/80 7/20: 45/80 8/8:  49/80 8/31 55/80 10/5: 58/80 11/28: 66/80   Time 8    Period Weeks    Status  Met   Target Date 05/25/2022         PT LONG TERM GOAL #4   Title Patient will improve R knee ROM (neutral to 120 degrees) for return to PLOF and safe functional mobility.    Baseline 6/12: locked in extension 7/20: seated 85 supine AROM: 75    extension: AROM -5 PROM -2 8/8:  supine AROM 75 PROM 80;   seated: AROM 73 PROM 86 8/31: supine:AROM 75 PROM 84   sitting; AROM 79 PROM 92 10/5: 128 degrees   Time 8    Period Weeks    Status MET   Target Date 03/28/2022       PT LONG TERM GOAL #5   Title Patient will increase six minute walk test distance to >1000 for progression to community ambulator and improve gait ability    Baseline 6/12: unable to test 7/20: 1340 w/o brace 8/8: 1690 ft    Time 8    Period Weeks    Status MET   Target Date 03/28/2022     PT LONG TERM GOAL #6  Title Patient will return to sport and running 5 miles without pain increase for return to PLOF   Baseline 8/8: unable to play or run 8/31: unable to 10/5: not cleared for return to sport yet 11/28: Patient was able to run 0.5 mile without stopping  1/4: 0.8 miles without stopping  Time 8   Period Weeks   Status On going  Target Date 07/18/2022             PT LONG TERM GOAL #7  Title Patient will increase BLE gross strength to 5/5 as to improve functional strength for independent gait, increased standing tolerance and increased ADL ability.  Baseline 10/5: see above 11/28: see above   Time 8   Period Weeks   Status Partially Met  Target Date 07/18/2022           PT LONG TERM GOAL #8  Title Patient will increase single leg hop test on the (R) leg to an average of > 75 in to demonstrate symmetry between legs and the ability to hop in order to return to sport with full confidence.   Baseline 11/28: Right: 37.6, 39, 40 in (Average: 38.8 in) 1/4; 32. 41.5, 42 (average 38.5)  Time 8  Period Weeks   Status Partially  Met  Target Date 07/18/2022       PT LONG TERM GOAL #9  Title Patient will increase Y-test on the (R) leg to an average of > 31.5 in to demonstrate symmetry between legs and increased ability to maintain single leg balance.    Baseline 11/28:(R) Forward (23.25 in), Behind Leg (31 in), Lateral (33 in), Average (29 in) 1/4: Right leg: Forward (34 in), Behind Leg (38 in), Lateral (33.5 in) Average (35.2 in)  Time 8  Period Weeks   Status MET  Target Date 07/18/2022        Plan       Clinical Impression Statement Patient tolerates return to sport exercises well with minimal pain. He has significant quad fatigue indicating need for continued quad strengthening. Cutting activities are nonpainful and are improving in coordination.  Patient will continue to benefit from skilled physical therapy interventions in order to improve right knee strength, overall confidence in sport activities, and overall functional capability of the RLE in order to return allow return to his previous level of function.   Personal Factors and Comorbidities Profession;Transportation    Examination-Activity Limitations Bathing;Bed Mobility;Bend;Caring for Others;Carry;Dressing;Hygiene/Grooming;Lift;Locomotion Level;Toileting;Stand;Stairs;Squat;Sit;Transfers    Examination-Participation Restrictions Cleaning;Community Activity;Driving;Meal Prep;Laundry;Occupation;Shop;Yard Work;Volunteer    Stability/Clinical Decision Making Stable/Uncomplicated    Rehab Potential Good    PT Frequency 2x / week    PT Duration 8 weeks    PT Treatment/Interventions ADLs/Self Care Home Management;Cryotherapy;Electrical Stimulation;Iontophoresis 4mg /ml Dexamethasone;Moist Heat;Gait training;DME Instruction;Stair training;Functional mobility training;Therapeutic activities;Therapeutic exercise;Patient/family education;Neuromuscular re-education;Balance training;Orthotic Fit/Training;Manual techniques;Passive range of motion;Scar  mobilization;Manual lymph drainage;Dry needling;Energy conservation;Splinting;Taping    PT Next Visit Plan    PT Home Exercise Plan see above    Consulted and Agree with Plan of Care Patient             PT  07/06/2022, 12:22 PM

## 2022-07-06 ENCOUNTER — Ambulatory Visit: Payer: 59

## 2022-07-06 DIAGNOSIS — M25661 Stiffness of right knee, not elsewhere classified: Secondary | ICD-10-CM

## 2022-07-06 DIAGNOSIS — M25561 Pain in right knee: Secondary | ICD-10-CM

## 2022-07-06 DIAGNOSIS — R262 Difficulty in walking, not elsewhere classified: Secondary | ICD-10-CM

## 2022-07-10 NOTE — Therapy (Signed)
OUTPATIENT PHYSICAL THERAPY TREATMENT NOTE  Patient Name: Douglas Adams MRN: [id number] DOB:12/05/2000, 22 y.o., male Today's Date: 07/11/2022  PCP: Saddie Benders MD  REFERRING PROVIDER: Saddie Benders MD  PT End of Session - 07/11/22 0716     Visit Number 53    Number of Visits 28    Date for PT Re-Evaluation 07/18/21    Authorization Type [employer] Employee; PN 11/28    Authorization Time Period 01/31/22-03/28/22    Progress Note Due on Visit 30    PT Start Time 0715    PT Stop Time 0759    PT Time Calculation (min) 44 min    Activity Tolerance Patient tolerated treatment well    Behavior During Therapy [facility] for tasks assessed/performed                Past Medical History:  Diagnosis Date   Allergies    PONV (postoperative nausea and vomiting) 12/01/2021   pt's with extended recovery- vomiting x 3 hrs.   Past Surgical History:  Procedure Laterality Date   ADENOIDECTOMY AND MYRINGOTOMY WITH TUBE PLACEMENT Bilateral 2007   ANTERIOR CRUCIATE LIGAMENT REPAIR Right 12/01/2021   Procedure: RECONSTRUCTION ANTERIOR CRUCIATE LIGAMENT (ACL);  Surgeon: Hiram Gash, MD;  Location: [place];  Service: Orthopedics;  Laterality: Right;   KNEE ARTHROSCOPY Right 03/02/2022   Procedure: ARTHROSCOPY KNEE;  Surgeon: Hiram Gash, MD;  Location: [place];  Service: Orthopedics;  Laterality: Right;   LYSIS OF ADHESION Right 03/02/2022   Procedure: LYSIS OF ADHESION/MANIPULATION;  Surgeon: Hiram Gash, MD;  Location: [place];  Service: Orthopedics;  Laterality: Right;   There are no problems to display for this patient.   REFERRING DIAG: ACL reconstruction and arthroscopy of R knee  THERAPY DIAG:  Stiffness of right knee, not elsewhere classified  Difficulty in walking, not elsewhere classified  Acute pain of right knee  Rationale for Evaluation and Treatment Rehabilitation  PERTINENT HISTORY: Patient is a 22 year old male s/p  ACL reconstruction with meniscal repair 12/01/21. Patient was injured with a twisting injury while playing basketball where he felt a pop and was unable to bear weight. Protocol not provided however [name] has protocol from clinic: knee extension lock for one week, d/c brace by 4 weeks if quad control appropriate. Progress PROM AAROM and AROM as tolerated for 0-6 weeks; with limitation of 0-90 for first 4 weeks. Pt goes for manip under anesth on 03/02/22.   PRECAUTIONS: ACL protocol  SUBJECTIVE:  Patient reports his leg was sore over the weekend but feeling good today.   PAIN:  Are you having pain? Yes: NPRS scale: 2/10 Pain location: Rt Patella, quad   Pain description: post surgical Aggravating factors: movement Relieving factors: elevating, ice, rest  INTERVENTION   Right Left  Hip flexion 4- 4+  Hip Abduction 4- 4+  Hip Adduction 4- 4+  Knee Extension  4 4+  Knee Flexion 3+ 4  DF 5 5  PF 4+ 5   There Ex Warm up:  Forward and backward on stationary bike 5 min   Walking "herding turtles" hamstring stretch 40 ft x  2 trials    Activity Description:Red: lateral shuffle to color lit up; red=10 squats, blue =10 lunges; green 10 jumps  Activity Setting:  The [employer] setting was chosen to enhance cognitive processing and agility, providing an unpredictable environment to simulate real-world scenarios, and fostering quick reactions and adaptability.   Number of  Pods:  5 Cycles/Sets:  10/x2 Duration (Time or Hit Count):  10  Activity Description: red ball right hand blue ball left hands; squat and hit pod   Activity Setting:  The [employer] setting was chosen to enhance cognitive processing and agility, providing an unpredictable environment to simulate real-world scenarios, and fostering quick reactions and adaptability.   Number of Pods:  6 Cycles/Sets:  2 Duration (Time or Hit Count):  10   KB RDL to squat 15x; x2 sets  Matrix machine deadlift #12.5 each side;  10x; 2 sets   Heel raise with legs on 2x4 20x  Squat with heels on 2x4 ; first round 10x; no weight; second time with cradle carry #15 KB 10   Single leg heel raise with leg on 2x4 10x each LE   Ice cup massage x4 minutes  Trigger Point Dry Needling (TDN), unbilled Education performed with patient regarding potential benefit of TDN. Reviewed precautions and risks with patient. Reviewed special precautions/risks over lung fields which include pneumothorax. Reviewed signs and symptoms of pneumothorax and advised pt to go to ER immediately if these symptoms develop advise them of dry needling treatment. Extensive time spent with pt to ensure full understanding of TDN risks. Pt provided verbal consent to treatment. TDN performed to  with 0.3 x 60 single needle placements with local twitch response (LTR). Pistoning technique utilized. Improved pain-free motion following intervention.  R quad . X2 minutes    PATIENT EDUCATION: Education details: exercise technique, body mechanics, protocol Person educated: Patient Education method: Explanation, Demonstration, Tactile cues, and Verbal cues Education comprehension: verbalized understanding, returned demonstration, verbal cues required, and tactile cues required  HOME EXERCISE PROGRAM: Access Code: [email or web address] URL: [email or web address] Date: 12/15/2021 Prepared by: Precious Bard  Exercises - Long Sitting Quad Set  - 1 x daily - 7 x weekly - 2 sets - 10 reps - 5 hold - Active Straight Leg Raise with Quad Set  - 1 x daily - 7 x weekly - 2 sets - 10 reps - 5 hold - Mini Squat with Counter Support  - 1 x daily - 7 x weekly - 2 sets - 10 reps - 5 hold - Side Lunge with Counter Support  - 1 x daily - 7 x weekly - 2 sets - 10 reps - 5 hold - Seated Ankle Alphabet  - 1 x daily - 7 x weekly - 2 sets - 10 reps - 5 hold  Updated HEP: Access Code: [email or web address] URL: [email or web address] Date: 06/06/2022 Prepared by: Precious Bard  Program Notes If you can all calf raises should be performed on an elevated surface. The single leg calf raises will be standing Calf Raises with 4-5th toes off edge and standing Calf Pulses with 4-5th toes off edge (Med bridge didn't have these exercises). Seated calf raises will target the soleus. The bridges through your heels will target your glutes and hamstrings. To progress the exercises move your heels further out to create a longer lever arm, after you are comfortable with those you can more to sinle leg bridging.   Exercises - Standing Heel Raise with Toes Turned In  - 1 x daily - 7 x weekly - 3 sets - 10 reps - 2 hold - Single-Leg Calf Raise on Step  - 1 x daily - 7 x weekly - 3 sets - 12 reps - 2 hold - Single-Leg Calf Raise on Step Pulses  - 1 x daily - 7 x weekly - 3  sets - 10 reps - Seated Calf Raise with Weights on Thighs On Step  - 1 x daily - 7 x weekly - 3 sets - 12 reps - 2 hold - Lunge Matrix  - 1 x daily - 7 x weekly - 3 sets - 5 reps - Bridge on Heels  - 1 x daily - 7 x weekly - 3 sets - 8 reps - 2 hold   PT Short Term Goals       PT SHORT TERM GOAL #1   Title Patient will be independent in home exercise program to improve strength/mobility for better functional independence with ADLs.    Baseline 6/12; HEP given 7/20 : HEP compliant    Time 4    Status Achieved   Target Date 01/02/22      PT SHORT TERM GOAL #2   Title Patient will report a worst pain of 5/10 on VAS in R knee to improve tolerance with ADLs and reduced symptoms with activities.    Baseline 6/12: 8/10 7/20: 6/10 8/8: 5/10    Time 4    Period Weeks    Status MET   Target Date 01/02/22      PT SHORT TERM GOAL #3   Title Patient will increase 10 meter walk test to >1.66m/s with LRAD as to improve gait speed for better community ambulation and to reduce fall risk.    Baseline 6/12:0.77 with crutches 7/20: 1.6 m/s    Time 4    Period Weeks    Status Achieved   Target Date 01/02/22      PT  SHORT TERM GOAL #4   Title --    Baseline --    Time --    Period --    Status --    Target Date --      PT SHORT TERM GOAL #5   Title --    Baseline --    Time --    Period --    Status --    Target Date --              PT Long Term Goals      PT LONG TERM GOAL #1   Title Patient will increase FOTO score to equal to or greater than  71   to demonstrate statistically significant improvement in mobility and quality of life.    Baseline 6/12: 30 7/20: 51 8/8: 65% 8/31: 64% 10/5: 67%   Time 8    Period Weeks    Status Partially Met   Target Date 05/25/2022         PT LONG TERM GOAL #2   Title Patient will report a worst pain of 3/10 on VAS in  R knee  to improve tolerance with ADLs and reduced symptoms with activities.    Baseline 6/12: 8/10 pain 7/20: 6/10 8/8: 5/10 8/31: 6/10 10/5: 5/10    Time 8    Period Weeks    Status Partially Met   Target Date 05/25/2022         PT LONG TERM GOAL #3   Title Patient will increase lower extremity functional scale to >60/80 to demonstrate improved functional mobility and increased tolerance with ADLs.    Baseline 6/12: 19/80 7/20: 45/80 8/8: 49/80 8/31 55/80 10/5: 58/80   Time 8    Period Weeks    Status Partially Met   Target Date 05/25/2022         PT LONG TERM GOAL #4  Title Patient will improve R knee ROM (neutral to 120 degrees) for return to PLOF and safe functional mobility.    Baseline 6/12: locked in extension 7/20: seated 85 supine AROM: 75    extension: AROM -5 PROM -2 8/8:  supine AROM 75 PROM 80;   seated: AROM 73 PROM 86 8/31: supine:AROM 75 PROM 84   sitting; AROM 79 PROM 92 10/5: 128 degrees   Time 8    Period Weeks    Status MET   Target Date 03/28/2022       PT LONG TERM GOAL #5   Title Patient will increase six minute walk test distance to >1000 for progression to community ambulator and improve gait ability    Baseline 6/12: unable to test 7/20: 1340 w/o brace 8/8: 1690 ft    Time 8     Period Weeks    Status MET   Target Date 03/28/2022     PT LONG TERM GOAL #6  Title Patient will return to sport and running 5 miles without pain increase for return to PLOF   Baseline 8/8: unable to play or run 8/31: unable to 10/5: not cleared for return to sport yet  Time 8   Period Weeks   Status On going  Target Date 05/25/2022           PT LONG TERM GOAL #7  Title Patient will increase BLE gross strength to 5/5 as to improve functional strength for independent gait, increased standing tolerance and increased ADL ability.  Baseline 10/5: see above  Time 8   Period Weeks   Status New  Target Date 05/25/2022             Plan       Clinical Impression Statement Patient tolerates progressive strengthening and return to sport interventions well with minimal pain increase. Patient highly motivated throughout physical therapy session. He does have fatigue of LE with repeated activation indicating continued need for focusing on strengthening.    Patient will continue to benefit from skilled physical therapy interventions in order to improve right knee range of motion, strength, in order to return allow return to his previous level of function.   Personal Factors and Comorbidities Profession;Transportation    Examination-Activity Limitations Bathing;Bed Mobility;Bend;Caring for Others;Carry;Dressing;Hygiene/Grooming;Lift;Locomotion Level;Toileting;Stand;Stairs;Squat;Sit;Transfers    Examination-Participation Restrictions Cleaning;Community Activity;Driving;Meal Prep;Laundry;Occupation;Shop;Yard Work;Volunteer    Stability/Clinical Decision Making Stable/Uncomplicated    Rehab Potential Good    PT Frequency 2x / week    PT Duration 8 weeks    PT Treatment/Interventions ADLs/Self Care Home Management;Cryotherapy;Electrical Stimulation;Iontophoresis 4mg /ml Dexamethasone;Moist Heat;Gait training;DME Instruction;Stair training;Functional mobility training;Therapeutic  activities;Therapeutic exercise;Patient/family education;Neuromuscular re-education;Balance training;Orthotic Fit/Training;Manual techniques;Passive range of motion;Scar mobilization;Manual lymph drainage;Dry needling;Energy conservation;Splinting;Taping    PT Next Visit Plan CREATE new updated HEP for patient.    PT Home Exercise Plan see above    Consulted and Agree with Plan of Care Patient             [name] PT  07/11/2022, 7:59 AM

## 2022-07-11 ENCOUNTER — Ambulatory Visit: Payer: 59

## 2022-07-11 DIAGNOSIS — M25661 Stiffness of right knee, not elsewhere classified: Secondary | ICD-10-CM | POA: Diagnosis not present

## 2022-07-11 DIAGNOSIS — R262 Difficulty in walking, not elsewhere classified: Secondary | ICD-10-CM | POA: Diagnosis not present

## 2022-07-11 DIAGNOSIS — M25561 Pain in right knee: Secondary | ICD-10-CM | POA: Diagnosis not present

## 2022-07-12 NOTE — Therapy (Signed)
OUTPATIENT PHYSICAL THERAPY TREATMENT NOTE  Patient Name: Douglas Adams MRN: [id number] DOB:11/22/2000, 22 y.o., male Today's Date: 07/13/2022  PCP: Saddie Benders MD  REFERRING PROVIDER: Saddie Benders MD   PT End of Session - 07/13/22 0759     Visit Number 44    Number of Visits 53    Date for PT Re-Evaluation 07/18/21    Authorization Type [employer] Employee; PN 11/28    Authorization Time Period 01/31/22-03/28/22    Progress Note Due on Visit 30    PT Start Time 0800    PT Stop Time 0844    PT Time Calculation (min) 44 min    Activity Tolerance Patient tolerated treatment well    Behavior During Therapy [facility] for tasks assessed/performed                 Past Medical History:  Diagnosis Date   Allergies    PONV (postoperative nausea and vomiting) 12/01/2021   pt's with extended recovery- vomiting x 3 hrs.   Past Surgical History:  Procedure Laterality Date   ADENOIDECTOMY AND MYRINGOTOMY WITH TUBE PLACEMENT Bilateral 2007   ANTERIOR CRUCIATE LIGAMENT REPAIR Right 12/01/2021   Procedure: RECONSTRUCTION ANTERIOR CRUCIATE LIGAMENT (ACL);  Surgeon: Hiram Gash, MD;  Location: [place];  Service: Orthopedics;  Laterality: Right;   KNEE ARTHROSCOPY Right 03/02/2022   Procedure: ARTHROSCOPY KNEE;  Surgeon: Hiram Gash, MD;  Location: [place];  Service: Orthopedics;  Laterality: Right;   LYSIS OF ADHESION Right 03/02/2022   Procedure: LYSIS OF ADHESION/MANIPULATION;  Surgeon: Hiram Gash, MD;  Location: [place];  Service: Orthopedics;  Laterality: Right;   There are no problems to display for this patient.  REFERRING DIAG: ACL reconstruction and arthroscopy of R knee  THERAPY DIAG:  Stiffness of right knee, not elsewhere classified  Difficulty in walking, not elsewhere classified  Acute pain of right knee  Rationale for Evaluation and Treatment Rehabilitation  PERTINENT HISTORY: Patient is a 22 year old male s/p  ACL reconstruction with meniscal repair 12/01/21. Patient was injured with a twisting injury while playing basketball where he felt a pop and was unable to bear weight. Protocol not provided however [name] has protocol from clinic: knee extension lock for one week, d/c brace by 4 weeks if quad control appropriate. Progress PROM AAROM and AROM as tolerated for 0-6 weeks; with limitation of 0-90 for first 4 weeks. Pt goes for manip under anesth on 03/02/22.   PRECAUTIONS: ACL protocol  SUBJECTIVE:  Patient had hamstring tightness the day prior, is better today. Cold is affecting his pain.   PAIN:  Are you having pain? Yes: NPRS scale: 3-4/10 Pain location: Rt Patella, quad   Pain description: post surgical Aggravating factors: movement Relieving factors: elevating, ice, rest  INTERVENTION Warm Up:  Stationary Bike 6 minutes: Forward 3 and Backward 3  Herding Turtles x 40 ft x 2 trials  Return to run protocol: - Walk 3.2 mps 1 minute 3x; jog 6.3 mps 1 minute x 3 trials  *Patient required cues to prevent from (B) feet from shifting outward and to promote more knee flexion in their (R) knee*  Strengthening:  Superset: x2 sets  RDL to squat with 15lb KB 10x; cue for hip hinging Modified sit to stand single limb ; 10x each LE; modified to sitting on 2 airex pad;   Back against wall:  Slow dorsiflexion focus on control and equal contraction 20x Lateral lean against wall:  raise knee closest to wall; plantarflex 10x ; knee furthest from the wall up , plantarflex 10x; x2 sets each LE  Back against wall ; ankle eversion then inversion with df 10x   Ice cup massage 3 minutes   Trigger Point Dry Needling (TDN), unbilled Education performed with patient regarding potential benefit of TDN. Reviewed precautions and risks with patient. Reviewed special precautions/risks over lung fields which include pneumothorax. Reviewed signs and symptoms of pneumothorax and advised pt to go to ER immediately if  these symptoms develop advise them of dry needling treatment. Extensive time spent with pt to ensure full understanding of TDN risks. Pt provided verbal consent to treatment. TDN performed to  with 0.25 x 40 single needle placements with local twitch response (LTR). Pistoning technique utilized. Improved pain-free motion following intervention. Bilateral hamstring 3 minutes     PATIENT EDUCATION: Education details: exercise technique, body mechanics, protocol Person educated: Patient Education method: Explanation, Demonstration, Tactile cues, and Verbal cues Education comprehension: verbalized understanding, returned demonstration, verbal cues required, and tactile cues required  HOME EXERCISE PROGRAM: Access Code: [email or web address] URL: [email or web address] Date: 12/15/2021 Prepared by: Precious Bard  Exercises- Long Sitting Quad Set  - 1 x daily - 7 x weekly - 2 sets - 10 reps - 5 hold - Active Straight Leg Raise with Quad Set  - 1 x daily - 7 x weekly - 2 sets - 10 reps - 5 hold - Mini Squat with Counter Support  - 1 x daily - 7 x weekly - 2 sets - 10 reps - 5 hold - Side Lunge with Counter Support  - 1 x daily - 7 x weekly - 2 sets - 10 reps - 5 hold - Seated Ankle Alphabet  - 1 x daily - 7 x weekly - 2 sets - 10 reps - 5 hold   PT Short Term Goals       PT SHORT TERM GOAL #1   Title Patient will be independent in home exercise program to improve strength/mobility for better functional independence with ADLs.    Baseline 6/12; HEP given 7/20 : HEP compliant    Time 4    Status Achieved   Target Date 01/02/22      PT SHORT TERM GOAL #2   Title Patient will report a worst pain of 5/10 on VAS in R knee to improve tolerance with ADLs and reduced symptoms with activities.    Baseline 6/12: 8/10 7/20: 6/10 8/8: 5/10    Time 4    Period Weeks    Status MET   Target Date 01/02/22      PT SHORT TERM GOAL #3   Title Patient will increase 10 meter walk test to >1.31m/s with  LRAD as to improve gait speed for better community ambulation and to reduce fall risk.    Baseline 6/12:0.77 with crutches 7/20: 1.6 m/s    Time 4    Period Weeks    Status Achieved   Target Date 01/02/22      PT SHORT TERM GOAL #4   Title --    Baseline --    Time --    Period --    Status --    Target Date --      PT SHORT TERM GOAL #5   Title --    Baseline --    Time --    Period --    Status --    Target Date --  PT Long Term Goals      PT LONG TERM GOAL #1   Title Patient will increase FOTO score to equal to or greater than  71   to demonstrate statistically significant improvement in mobility and quality of life.    Baseline 6/12: 30 7/20: 51 8/8: 65% 8/31: 64% 10/5: 67%   Time 8 11/28: 71% 75%    Period Weeks    Status  Met   Target Date 07/18/2022           PT LONG TERM GOAL #2   Title Patient will report a worst pain of 3/10 on VAS in  R knee  to improve tolerance with ADLs and reduced symptoms with activities.    Baseline 6/12: 8/10 pain 7/20: 6/10 8/8: 5/10 8/31: 6/10 10/5: 5/10 11/28: 5/10 1/4: 4/10   Time 8    Period Weeks    Status Partially Met   Target Date 07/18/2022           PT LONG TERM GOAL #3   Title Patient will increase lower extremity functional scale to >60/80 to demonstrate improved functional mobility and increased tolerance with ADLs.    Baseline 6/12: 19/80 7/20: 45/80 8/8: 49/80 8/31 55/80 10/5: 58/80 11/28: 66/80   Time 8    Period Weeks    Status  Met   Target Date 05/25/2022         PT LONG TERM GOAL #4   Title Patient will improve R knee ROM (neutral to 120 degrees) for return to PLOF and safe functional mobility.    Baseline 6/12: locked in extension 7/20: seated 85 supine AROM: 75    extension: AROM -5 PROM -2 8/8:  supine AROM 75 PROM 80;   seated: AROM 73 PROM 86 8/31: supine:AROM 75 PROM 84   sitting; AROM 79 PROM 92 10/5: 128 degrees   Time 8    Period Weeks    Status MET   Target Date  03/28/2022       PT LONG TERM GOAL #5   Title Patient will increase six minute walk test distance to >1000 for progression to community ambulator and improve gait ability    Baseline 6/12: unable to test 7/20: 1340 w/o brace 8/8: 1690 ft    Time 8    Period Weeks    Status MET   Target Date 03/28/2022     PT LONG TERM GOAL #6  Title Patient will return to sport and running 5 miles without pain increase for return to PLOF   Baseline 8/8: unable to play or run 8/31: unable to 10/5: not cleared for return to sport yet 11/28: Patient was able to run 0.5 mile without stopping  1/4: 0.8 miles without stopping  Time 8   Period Weeks   Status On going  Target Date 07/18/2022             PT LONG TERM GOAL #7  Title Patient will increase BLE gross strength to 5/5 as to improve functional strength for independent gait, increased standing tolerance and increased ADL ability.  Baseline 10/5: see above 11/28: see above   Time 8   Period Weeks   Status Partially Met  Target Date 07/18/2022           PT LONG TERM GOAL #8  Title Patient will increase single leg hop test on the (R) leg to an average of > 75 in to demonstrate symmetry between legs and the ability to hop in  order to return to sport with full confidence.   Baseline 11/28: Right: 37.6, 39, 40 in (Average: 38.8 in) 1/4; 32. 41.5, 42 (average 38.5)  Time 8  Period Weeks   Status Partially Met  Target Date 07/18/2022       PT LONG TERM GOAL #9  Title Patient will increase Y-test on the (R) leg to an average of > 31.5 in to demonstrate symmetry between legs and increased ability to maintain single leg balance.    Baseline 11/28:(R) Forward (23.25 in), Behind Leg (31 in), Lateral (33 in), Average (29 in) 1/4: Right leg: Forward (34 in), Behind Leg (38 in), Lateral (33.5 in) Average (35.2 in)  Time 8  Period Weeks   Status MET  Target Date 07/18/2022        Plan       Clinical Impression Statement Patient  initially presents with increased hamstring tightness that is reduced by end of session. Patient is highly motivated throughout physical therapy session. He is able to run with minimal pain increase. R quad tightness is decreased by end of session and patient tolerates all strengthening interventions well. Single leg sit to stands continue to be limited and an area for progression.  Patient will continue to benefit from skilled physical therapy interventions in order to improve right knee strength, overall confidence in sport activities, and overall functional capability of the RLE in order to return allow return to his previous level of function.   Personal Factors and Comorbidities Profession;Transportation    Examination-Activity Limitations Bathing;Bed Mobility;Bend;Caring for Others;Carry;Dressing;Hygiene/Grooming;Lift;Locomotion Level;Toileting;Stand;Stairs;Squat;Sit;Transfers    Examination-Participation Restrictions Cleaning;Community Activity;Driving;Meal Prep;Laundry;Occupation;Shop;Yard Work;Volunteer    Stability/Clinical Decision Making Stable/Uncomplicated    Rehab Potential Good    PT Frequency 2x / week    PT Duration 8 weeks    PT Treatment/Interventions ADLs/Self Care Home Management;Cryotherapy;Electrical Stimulation;Iontophoresis 4mg /ml Dexamethasone;Moist Heat;Gait training;DME Instruction;Stair training;Functional mobility training;Therapeutic activities;Therapeutic exercise;Patient/family education;Neuromuscular re-education;Balance training;Orthotic Fit/Training;Manual techniques;Passive range of motion;Scar mobilization;Manual lymph drainage;Dry needling;Energy conservation;Splinting;Taping    PT Next Visit Plan    PT Home Exercise Plan see above    Consulted and Agree with Plan of Care Patient             PT  07/13/2022, 9:28 AM

## 2022-07-13 ENCOUNTER — Ambulatory Visit: Payer: 59

## 2022-07-13 DIAGNOSIS — M25661 Stiffness of right knee, not elsewhere classified: Secondary | ICD-10-CM

## 2022-07-13 DIAGNOSIS — R262 Difficulty in walking, not elsewhere classified: Secondary | ICD-10-CM

## 2022-07-13 DIAGNOSIS — M25561 Pain in right knee: Secondary | ICD-10-CM

## 2022-07-17 NOTE — Therapy (Signed)
OUTPATIENT PHYSICAL THERAPY TREATMENT NOTE /RECERT Patient Name: Douglas Adams MRN: [id number] DOB:[birth year], 22 y.o., male Today's Date: 07/18/2022  PCP: Saddie Benders MD  REFERRING PROVIDER: Saddie Benders MD   PT End of Session - 07/18/22 0759     Visit Number 55    Number of Visits 36    Date for PT Re-Evaluation 09/12/22    Authorization Type [employer] Employee; PN 11/28    Authorization Time Period 01/31/22-03/28/22    Progress Note Due on Visit 30    PT Start Time 0715    PT Stop Time 0800    PT Time Calculation (min) 45 min    Activity Tolerance Patient tolerated treatment well    Behavior During Therapy [facility] for tasks assessed/performed                  Past Medical History:  Diagnosis Date   Allergies    PONV (postoperative nausea and vomiting) 12/01/2021   pt's with extended recovery- vomiting x 3 hrs.   Past Surgical History:  Procedure Laterality Date   ADENOIDECTOMY AND MYRINGOTOMY WITH TUBE PLACEMENT Bilateral 2007   ANTERIOR CRUCIATE LIGAMENT REPAIR Right 12/01/2021   Procedure: RECONSTRUCTION ANTERIOR CRUCIATE LIGAMENT (ACL);  Surgeon: Hiram Gash, MD;  Location: [place];  Service: Orthopedics;  Laterality: Right;   KNEE ARTHROSCOPY Right 03/02/2022   Procedure: ARTHROSCOPY KNEE;  Surgeon: Hiram Gash, MD;  Location: [place];  Service: Orthopedics;  Laterality: Right;   LYSIS OF ADHESION Right 03/02/2022   Procedure: LYSIS OF ADHESION/MANIPULATION;  Surgeon: Hiram Gash, MD;  Location: [place];  Service: Orthopedics;  Laterality: Right;   There are no problems to display for this patient.  REFERRING DIAG: ACL reconstruction and arthroscopy of R knee  THERAPY DIAG:  Stiffness of right knee, not elsewhere classified  Difficulty in walking, not elsewhere classified  Acute pain of right knee  Rationale for Evaluation and Treatment Rehabilitation  PERTINENT HISTORY: Patient is a 21 year old  male s/p ACL reconstruction with meniscal repair 12/01/21. Patient was injured with a twisting injury while playing basketball where he felt a pop and was unable to bear weight. Protocol not provided however [name] has protocol from clinic: knee extension lock for one week, d/c brace by 4 weeks if quad control appropriate. Progress PROM AAROM and AROM as tolerated for 0-6 weeks; with limitation of 0-90 for first 4 weeks. Pt goes for manip under anesth on 03/02/22.   PRECAUTIONS: ACL protocol  SUBJECTIVE:  Patient reports compliance with HEP. Is progressing his strengthening.   PAIN:  Are you having pain? Yes: NPRS scale: 3-4/10 Pain location: Rt Patella, quad   Pain description: post surgical Aggravating factors: movement Relieving factors: elevating, ice, rest  INTERVENTION Goals: see below for details    Right Left  Hip flexion 4+ 5  Hip Abduction 4 5  Hip Adduction 4+ 5  Knee Extension  4 5  Knee Flexion 4 5  Hip IR 4+ 5  Hip ER 4 5    Warm Up:  Stationary Bike 6 minutes: Forward 3 and Backward 3   Ice cup massage 4 minutes   Trigger Point Dry Needling (TDN), unbilled Education performed with patient regarding potential benefit of TDN. Reviewed precautions and risks with patient. Reviewed special precautions/risks over lung fields which include pneumothorax. Reviewed signs and symptoms of pneumothorax and advised pt to go to ER immediately if these symptoms develop advise them of dry  needling treatment. Extensive time spent with pt to ensure full understanding of TDN risks. Pt provided verbal consent to treatment. TDN performed to  with 0.25 x 40 single needle placements with local twitch response (LTR). Pistoning technique utilized. Improved pain-free motion following intervention. R hamstring and quad 3 minutes     PATIENT EDUCATION: Education details: exercise technique, body mechanics, protocol Person educated: Patient Education method: Explanation, Demonstration, Tactile  cues, and Verbal cues Education comprehension: verbalized understanding, returned demonstration, verbal cues required, and tactile cues required  HOME EXERCISE PROGRAM: Access Code: [email or web address] URL: [email or web address] Date: 12/15/2021 Prepared by: [name]  Exercises- Long Sitting Quad Set  - 1 x daily - 7 x weekly - 2 sets - 10 reps - 5 hold - Active Straight Leg Raise with Quad Set  - 1 x daily - 7 x weekly - 2 sets - 10 reps - 5 hold - Mini Squat with Counter Support  - 1 x daily - 7 x weekly - 2 sets - 10 reps - 5 hold - Side Lunge with Counter Support  - 1 x daily - 7 x weekly - 2 sets - 10 reps - 5 hold - Seated Ankle Alphabet  - 1 x daily - 7 x weekly - 2 sets - 10 reps - 5 hold  Access Code: [email or web address] URL: [email or web address] Date: 07/18/2022 Prepared by: [name]  Exercises - Supine Bridge  - 1 x daily - 7 x weekly - 2 sets - 10 reps - 5 hold - Single Leg Bridge  - 1 x daily - 7 x weekly - 2 sets - 10 reps - 5 hold - Split Squats Upright Trunk with Dumbbells (Quad Bias)  - 1 x daily - 7 x weekly - 2 sets - 10 reps - 5 hold - Wall Squat with Heel Raises  - 1 x daily - 7 x weekly - 2 sets - 10 reps - 5 hold - Full Leg Press  - 1 x daily - 7 x weekly - 3 sets - 10 reps - Eccentric Hamstring Curl with Weight Machine  - 1 x daily - 7 x weekly - 3 sets - 10 reps - Single Leg Knee Extension with Weight Machine  - 1 x daily - 7 x weekly - 3 sets - 10 reps   PT Short Term Goals       PT SHORT TERM GOAL #1   Title Patient will be independent in home exercise program to improve strength/mobility for better functional independence with ADLs.    Baseline 6/12; HEP given 7/20 : HEP compliant    Time 4    Status Achieved   Target Date 01/02/22      PT SHORT TERM GOAL #2   Title Patient will report a worst pain of 5/10 on VAS in R knee to improve tolerance with ADLs and reduced symptoms with activities.    Baseline 6/12: 8/10 7/20: 6/10 8/8: 5/10     Time 4    Period Weeks    Status MET   Target Date 01/02/22      PT SHORT TERM GOAL #3   Title Patient will increase 10 meter walk test to >1.30m/s with LRAD as to improve gait speed for better community ambulation and to reduce fall risk.    Baseline 6/12:0.77 with crutches 7/20: 1.6 m/s    Time 4    Period Weeks    Status Achieved   Target Date 01/02/22      PT  SHORT TERM GOAL #4   Title --    Baseline --    Time --    Period --    Status --    Target Date --      PT SHORT TERM GOAL #5   Title --    Baseline --    Time --    Period --    Status --    Target Date --              PT Long Term Goals      PT LONG TERM GOAL #1   Title Patient will increase FOTO score to equal to or greater than  71   to demonstrate statistically significant improvement in mobility and quality of life.    Baseline 6/12: 30 7/20: 51 8/8: 65% 8/31: 64% 10/5: 67%   Time 8 11/28: 71% 75%    Period Weeks    Status  Met   Target Date 07/18/2022           PT LONG TERM GOAL #2   Title Patient will report a worst pain of 3/10 on VAS in  R knee  to improve tolerance with ADLs and reduced symptoms with activities.    Baseline 6/12: 8/10 pain 7/20: 6/10 8/8: 5/10 8/31: 6/10 10/5: 5/10 11/28: 5/10 1/4: 4/10 1/23: 3/10    Time 8    Period Weeks    Status MET   Target Date 07/18/2022           PT LONG TERM GOAL #3   Title Patient will increase lower extremity functional scale to >60/80 to demonstrate improved functional mobility and increased tolerance with ADLs.    Baseline 6/12: 19/80 7/20: 45/80 8/8: 49/80 8/31 55/80 10/5: 58/80 11/28: 66/80   Time 8    Period Weeks    Status  Met   Target Date 05/25/2022         PT LONG TERM GOAL #4   Title Patient will improve R knee ROM (neutral to 120 degrees) for return to PLOF and safe functional mobility.    Baseline 6/12: locked in extension 7/20: seated 85 supine AROM: 75    extension: AROM -5 PROM -2 8/8:  supine AROM 75 PROM  80;   seated: AROM 73 PROM 86 8/31: supine:AROM 75 PROM 84   sitting; AROM 79 PROM 92 10/5: 128 degrees   Time 8    Period Weeks    Status MET   Target Date 03/28/2022       PT LONG TERM GOAL #5   Title Patient will increase six minute walk test distance to >1000 for progression to community ambulator and improve gait ability    Baseline 6/12: unable to test 7/20: 1340 w/o brace 8/8: 1690 ft    Time 8    Period Weeks    Status MET   Target Date 03/28/2022     PT LONG TERM GOAL #6  Title Patient will return to sport and running 5 miles without pain increase for return to PLOF   Baseline 8/8: unable to play or run 8/31: unable to 10/5: not cleared for return to sport yet 11/28: Patient was able to run 0.5 mile without stopping  1/4: 0.8 miles without stopping 1/23: 1 mile in 9: 16 minutes   Time 8   Period Weeks   Status On going  Target Date 09/12/2022               PT LONG  TERM GOAL #7  Title Patient will increase BLE gross strength to 5/5 as to improve functional strength for independent gait, increased standing tolerance and increased ADL ability.  Baseline 10/5: see above 11/28: see above 1/23: see above  Time 8   Period Weeks   Status Partially Met  Target Date 09/12/2022             PT LONG TERM GOAL #8  Title Patient will increase single leg hop test on the (R) leg to an average of > 75 in to demonstrate symmetry between legs and the ability to hop in order to return to sport with full confidence.   Baseline 11/28: Right: 37.6, 39, 40 in (Average: 38.8 in) 1/4; 32. 41.5, 42 (average 38.5) 1/23: 36.5, 38.5, 41 (average 38.7)    Time 8  Period Weeks   Status Partially Met  Target Date 09/12/2022         PT LONG TERM GOAL #9  Title Patient will increase Y-test on the (R) leg to an average of > 31.5 in to demonstrate symmetry between legs and increased ability to maintain single leg balance.    Baseline 11/28:(R) Forward (23.25 in), Behind Leg (31  in), Lateral (33 in), Average (29 in) 1/4: Right leg: Forward (34 in), Behind Leg (38 in), Lateral (33.5 in) Average (35.2 in)  Time 8  Period Weeks   Status MET  Target Date 07/18/2022        Plan       Clinical Impression Statement Patient demosntrates excellent progress towards functional goals at this time. He has met his pain goal and is progressing towards his functional single leg jump and strength goal. Will decrease frequency to 1x/week with focus on gym program HEP and dynamic return to sport while in clinic. Patient is agreeable to plan, updated HEP given to patient.  Patient will continue to benefit from skilled physical therapy interventions in order to improve right knee strength, overall confidence in sport activities, and overall functional capability of the RLE in order to return allow return to his previous level of function.   Personal Factors and Comorbidities Profession;Transportation    Examination-Activity Limitations Bathing;Bed Mobility;Bend;Caring for Others;Carry;Dressing;Hygiene/Grooming;Lift;Locomotion Level;Toileting;Stand;Stairs;Squat;Sit;Transfers    Examination-Participation Restrictions Cleaning;Community Activity;Driving;Meal Prep;Laundry;Occupation;Shop;Yard Work;Volunteer    Stability/Clinical Decision Making Stable/Uncomplicated    Rehab Potential Good    PT Frequency 1x / week    PT Duration 8 weeks    PT Treatment/Interventions ADLs/Self Care Home Management;Cryotherapy;Electrical Stimulation;Iontophoresis 4mg /ml Dexamethasone;Moist Heat;Gait training;DME Instruction;Stair training;Functional mobility training;Therapeutic activities;Therapeutic exercise;Patient/family education;Neuromuscular re-education;Balance training;Orthotic Fit/Training;Manual techniques;Passive range of motion;Scar mobilization;Manual lymph drainage;Dry needling;Energy conservation;Splinting;Taping    PT Next Visit Plan    PT Home Exercise Plan see above    Consulted and Agree  with Plan of Care Patient             PT  07/18/2022, 8:00 AM

## 2022-07-18 ENCOUNTER — Ambulatory Visit: Payer: 59

## 2022-07-18 DIAGNOSIS — R262 Difficulty in walking, not elsewhere classified: Secondary | ICD-10-CM

## 2022-07-18 DIAGNOSIS — M25661 Stiffness of right knee, not elsewhere classified: Secondary | ICD-10-CM | POA: Diagnosis not present

## 2022-07-18 DIAGNOSIS — M25561 Pain in right knee: Secondary | ICD-10-CM | POA: Diagnosis not present

## 2022-07-20 ENCOUNTER — Ambulatory Visit: Payer: 59

## 2022-07-24 NOTE — Therapy (Incomplete)
OUTPATIENT PHYSICAL THERAPY TREATMENT NOTE /RECERT Patient Name: Douglas Adams MRN: [id number] DOB:[birth year], 22 y.o., male Today's Date: 07/24/2022  PCP: Saddie Benders MD  REFERRING PROVIDER: Saddie Benders MD          Past Medical History:  Diagnosis Date   Allergies    PONV (postoperative nausea and vomiting) 12/01/2021   pt's with extended recovery- vomiting x 3 hrs.   Past Surgical History:  Procedure Laterality Date   ADENOIDECTOMY AND MYRINGOTOMY WITH TUBE PLACEMENT Bilateral 2007   ANTERIOR CRUCIATE LIGAMENT REPAIR Right 12/01/2021   Procedure: RECONSTRUCTION ANTERIOR CRUCIATE LIGAMENT (ACL);  Surgeon: Hiram Gash, MD;  Location: [place];  Service: Orthopedics;  Laterality: Right;   KNEE ARTHROSCOPY Right 03/02/2022   Procedure: ARTHROSCOPY KNEE;  Surgeon: Hiram Gash, MD;  Location: [place];  Service: Orthopedics;  Laterality: Right;   LYSIS OF ADHESION Right 03/02/2022   Procedure: LYSIS OF ADHESION/MANIPULATION;  Surgeon: Hiram Gash, MD;  Location: [place];  Service: Orthopedics;  Laterality: Right;   There are no problems to display for this patient.  REFERRING DIAG: ACL reconstruction and arthroscopy of R knee  THERAPY DIAG:  No diagnosis found.  Rationale for Evaluation and Treatment Rehabilitation  PERTINENT HISTORY: Patient is a 22 year old male s/p ACL reconstruction with meniscal repair 12/01/21. Patient was injured with a twisting injury while playing basketball where he felt a pop and was unable to bear weight. Protocol not provided however [name] has protocol from clinic: knee extension lock for one week, d/c brace by 4 weeks if quad control appropriate. Progress PROM AAROM and AROM as tolerated for 0-6 weeks; with limitation of 0-90 for first 4 weeks. Pt goes for manip under anesth on 03/02/22.   PRECAUTIONS: ACL protocol  SUBJECTIVE:  Patient reports compliance with HEP. Is progressing his  strengthening.   PAIN:  Are you having pain? Yes: NPRS scale: 3-4/10 Pain location: Rt Patella, quad   Pain description: post surgical Aggravating factors: movement Relieving factors: elevating, ice, rest  INTERVENTION    Right Left  Hip flexion 4+ 5  Hip Abduction 4 5  Hip Adduction 4+ 5  Knee Extension  4 5  Knee Flexion 4 5  Hip IR 4+ 5  Hip ER 4 5    Warm Up:  Stationary Bike 6 minutes: Forward 3 and Backward 3     OUTPATIENT PHYSICAL THERAPY TREATMENT NOTE   Patient Name: Douglas Adams MRN: [id number] DOB:[birth year], 22 y.o., male Today's Date: 07/11/2022   PCP: Saddie Benders MD  REFERRING PROVIDER: Saddie Benders MD   PT End of Session - 07/11/22 0716       Visit Number [id number]     Number of Visits 51     Date for PT Re-Evaluation 07/18/21     Authorization Type [employer] Employee; PN 11/28     Authorization Time Period 01/31/22-03/28/22     Progress Note Due on Visit 30     PT Start Time 0715     PT Stop Time 0759     PT Time Calculation (min) 44 min     Activity Tolerance Patient tolerated treatment well     Behavior During Therapy [facility] for tasks assessed/performed                             Past Medical History:  Diagnosis Date   Allergies     PONV (postoperative  nausea and vomiting) 12/01/2021    pt's with extended recovery- vomiting x 3 hrs.         Past Surgical History:  Procedure Laterality Date   ADENOIDECTOMY AND MYRINGOTOMY WITH TUBE PLACEMENT Bilateral 2007   ANTERIOR CRUCIATE LIGAMENT REPAIR Right 12/01/2021    Procedure: RECONSTRUCTION ANTERIOR CRUCIATE LIGAMENT (ACL);  Surgeon: Bjorn Pippin, MD;  Location: [facility];  Service: Orthopedics;  Laterality: Right;   KNEE ARTHROSCOPY Right 03/02/2022    Procedure: ARTHROSCOPY KNEE;  Surgeon: Bjorn Pippin, MD;  Location: [facility];  Service: Orthopedics;  Laterality: Right;   LYSIS OF ADHESION Right 03/02/2022    Procedure: LYSIS OF ADHESION/MANIPULATION;   Surgeon: Bjorn Pippin, MD;  Location: [facility];  Service: Orthopedics;  Laterality: Right;    There are no problems to display for this patient.     REFERRING DIAG: ACL reconstruction and arthroscopy of R knee   THERAPY DIAG:  Stiffness of right knee, not elsewhere classified   Difficulty in walking, not elsewhere classified   Acute pain of right knee   Rationale for Evaluation and Treatment Rehabilitation   PERTINENT HISTORY: Patient is a 22 year old male s/p ACL reconstruction with meniscal repair 12/01/21. Patient was injured with a twisting injury while playing basketball where he felt a pop and was unable to bear weight. Protocol not provided however Thereasa Parkin has protocol from clinic: knee extension lock for one week, d/c brace by 4 weeks if quad control appropriate. Progress PROM AAROM and AROM as tolerated for 0-6 weeks; with limitation of 0-90 for first 4 weeks. Pt goes for manip under anesth on 03/02/22.    PRECAUTIONS: ACL protocol   SUBJECTIVE:  Patient reports his leg was sore over the weekend but feeling good today.    PAIN:  Are you having pain? Yes: NPRS scale: 2/10 Pain location: Rt Patella, quad   Pain description: post surgical Aggravating factors: movement Relieving factors: elevating, ice, rest   INTERVENTION     Right Left  Hip flexion 4- 4+  Hip Abduction 4- 4+  Hip Adduction 4- 4+  Knee Extension  4 4+  Knee Flexion 3+ 4  DF 5 5  PF 4+ 5    There Ex Warm up:  Forward and backward on stationary bike 5 min    Walking "herding turtles" hamstring stretch 40 ft x  2 trials      Activity Description:Red: lateral shuffle to color lit up; red=10 squats, blue =10 lunges; green 10 jumps  Activity Setting:  The [employer] Random setting was chosen to enhance cognitive processing and agility, providing an unpredictable environment to simulate real-world scenarios, and fostering quick reactions and adaptability.   Number of Pods:  5 Cycles/Sets:   10/x2 Duration (Time or Hit Count):  10   Activity Description: red ball right hand blue ball left hands; squat and hit pod    Activity Setting:  The Blaze Pod Random setting was chosen to enhance cognitive processing and agility, providing an unpredictable environment to simulate real-world scenarios, and fostering quick reactions and adaptability.   Number of Pods:  6 Cycles/Sets:  2 Duration (Time or Hit Count):  10        Ice cup massage 4 minutes   Trigger Point Dry Needling (TDN), unbilled Education performed with patient regarding potential benefit of TDN. Reviewed precautions and risks with patient. Reviewed special precautions/risks over lung fields which include pneumothorax. Reviewed signs and symptoms of  pneumothorax and advised pt to go to ER immediately if these symptoms develop advise them of dry needling treatment. Extensive time spent with pt to ensure full understanding of TDN risks. Pt provided verbal consent to treatment. TDN performed to  with 0.25 x 40 single needle placements with local twitch response (LTR). Pistoning technique utilized. Improved pain-free motion following intervention. R hamstring and quad 3 minutes     PATIENT EDUCATION: Education details: exercise technique, body mechanics, protocol Person educated: Patient Education method: Explanation, Demonstration, Tactile cues, and Verbal cues Education comprehension: verbalized understanding, returned demonstration, verbal cues required, and tactile cues required  HOME EXERCISE PROGRAM: Access Code: [email or web address] URL: [email or web address] Date: 12/15/2021 Prepared by: Precious Bard  Exercises- Long Sitting Quad Set  - 1 x daily - 7 x weekly - 2 sets - 10 reps - 5 hold - Active Straight Leg Raise with Quad Set  - 1 x daily - 7 x weekly - 2 sets - 10 reps - 5 hold - Mini Squat with Counter Support  - 1 x daily - 7 x weekly - 2 sets - 10 reps - 5 hold - Side Lunge with Counter Support  - 1  x daily - 7 x weekly - 2 sets - 10 reps - 5 hold - Seated Ankle Alphabet  - 1 x daily - 7 x weekly - 2 sets - 10 reps - 5 hold  Access Code: [email or web address] URL: [email or web address] Date: 07/18/2022 Prepared by: Precious Bard  Exercises - Supine Bridge  - 1 x daily - 7 x weekly - 2 sets - 10 reps - 5 hold - Single Leg Bridge  - 1 x daily - 7 x weekly - 2 sets - 10 reps - 5 hold - Split Squats Upright Trunk with Dumbbells (Quad Bias)  - 1 x daily - 7 x weekly - 2 sets - 10 reps - 5 hold - Wall Squat with Heel Raises  - 1 x daily - 7 x weekly - 2 sets - 10 reps - 5 hold - Full Leg Press  - 1 x daily - 7 x weekly - 3 sets - 10 reps - Eccentric Hamstring Curl with Weight Machine  - 1 x daily - 7 x weekly - 3 sets - 10 reps - Single Leg Knee Extension with Weight Machine  - 1 x daily - 7 x weekly - 3 sets - 10 reps   PT Short Term Goals       PT SHORT TERM GOAL #1   Title Patient will be independent in home exercise program to improve strength/mobility for better functional independence with ADLs.    Baseline 6/12; HEP given 7/20 : HEP compliant    Time 4    Status Achieved   Target Date 01/02/22      PT SHORT TERM GOAL #2   Title Patient will report a worst pain of 5/10 on VAS in R knee to improve tolerance with ADLs and reduced symptoms with activities.    Baseline 6/12: 8/10 7/20: 6/10 8/8: 5/10    Time 4    Period Weeks    Status MET   Target Date 01/02/22      PT SHORT TERM GOAL #3   Title Patient will increase 10 meter walk test to >1.45m/s with LRAD as to improve gait speed for better community ambulation and to reduce fall risk.    Baseline 6/12:0.77 with crutches 7/20: 1.6 m/s    Time 4    Period Weeks  Status Achieved   Target Date 01/02/22      PT SHORT TERM GOAL #4   Title --    Baseline --    Time --    Period --    Status --    Target Date --      PT SHORT TERM GOAL #5   Title --    Baseline --    Time --    Period --    Status --    Target  Date --              PT Long Term Goals      PT LONG TERM GOAL #1   Title Patient will increase FOTO score to equal to or greater than  71   to demonstrate statistically significant improvement in mobility and quality of Adams.    Baseline 6/12: 30 7/20: 51 8/8: 65% 8/31: 64% 10/5: 67%   Time 8 11/28: 71% 75%    Period Weeks    Status  Met   Target Date 07/18/2022           PT LONG TERM GOAL #2   Title Patient will report a worst pain of 3/10 on VAS in  R knee  to improve tolerance with ADLs and reduced symptoms with activities.    Baseline 6/12: 8/10 pain 7/20: 6/10 8/8: 5/10 8/31: 6/10 10/5: 5/10 11/28: 5/10 1/4: 4/10 1/23: 3/10    Time 8    Period Weeks    Status MET   Target Date 07/18/2022           PT LONG TERM GOAL #3   Title Patient will increase lower extremity functional scale to >60/80 to demonstrate improved functional mobility and increased tolerance with ADLs.    Baseline 6/12: 19/80 7/20: 45/80 8/8: 49/80 8/31 55/80 10/5: 58/80 11/28: 66/80   Time 8    Period Weeks    Status  Met   Target Date 05/25/2022         PT LONG TERM GOAL #4   Title Patient will improve R knee ROM (neutral to 120 degrees) for return to PLOF and safe functional mobility.    Baseline 6/12: locked in extension 7/20: seated 85 supine AROM: 75    extension: AROM -5 PROM -2 8/8:  supine AROM 75 PROM 80;   seated: AROM 73 PROM 86 8/31: supine:AROM 75 PROM 84   sitting; AROM 79 PROM 92 10/5: 128 degrees   Time 8    Period Weeks    Status MET   Target Date 03/28/2022       PT LONG TERM GOAL #5   Title Patient will increase six minute walk test distance to >1000 for progression to community ambulator and improve gait ability    Baseline 6/12: unable to test 7/20: 1340 w/o brace 8/8: 1690 ft    Time 8    Period Weeks    Status MET   Target Date 03/28/2022     PT LONG TERM GOAL #6  Title Patient will return to sport and running 5 miles without pain increase for return to  PLOF   Baseline 8/8: unable to play or run 8/31: unable to 10/5: not cleared for return to sport yet 11/28: Patient was able to run 0.5 mile without stopping  1/4: 0.8 miles without stopping 1/23: 1 mile in 9: 16 minutes   Time 8   Period Weeks   Status On going  Target Date 09/12/2022  PT LONG TERM GOAL #7  Title Patient will increase BLE gross strength to 5/5 as to improve functional strength for independent gait, increased standing tolerance and increased ADL ability.  Baseline 10/5: see above 11/28: see above 1/23: see above  Time 8   Period Weeks   Status Partially Met  Target Date 09/12/2022             PT LONG TERM GOAL #8  Title Patient will increase single leg hop test on the (R) leg to an average of > 75 in to demonstrate symmetry between legs and the ability to hop in order to return to sport with full confidence.   Baseline 11/28: Right: 37.6, 39, 40 in (Average: 38.8 in) 1/4; 32. 41.5, 42 (average 38.5) 1/23: 36.5, 38.5, 41 (average 38.7)    Time 8  Period Weeks   Status Partially Met  Target Date 09/12/2022         PT LONG TERM GOAL #9  Title Patient will increase Y-test on the (R) leg to an average of > 31.5 in to demonstrate symmetry between legs and increased ability to maintain single leg balance.    Baseline 11/28:(R) Forward (23.25 in), Behind Leg (31 in), Lateral (33 in), Average (29 in) 1/4: Right leg: Forward (34 in), Behind Leg (38 in), Lateral (33.5 in) Average (35.2 in)  Time 8  Period Weeks   Status MET  Target Date 07/18/2022        Plan       Clinical Impression Statement *** Patient will continue to benefit from skilled physical therapy interventions in order to improve right knee strength, overall confidence in sport activities, and overall functional capability of the RLE in order to return allow return to his previous level of function.   Personal Factors and Comorbidities Profession;Transportation     Examination-Activity Limitations Bathing;Bed Mobility;Bend;Caring for Others;Carry;Dressing;Hygiene/Grooming;Lift;Locomotion Level;Toileting;Stand;Stairs;Squat;Sit;Transfers    Examination-Participation Restrictions Cleaning;Community Activity;Driving;Meal Prep;Laundry;Occupation;Shop;Yard Work;Volunteer    Stability/Clinical Decision Making Stable/Uncomplicated    Rehab Potential Good    PT Frequency 1x / week    PT Duration 8 weeks    PT Treatment/Interventions ADLs/Self Care Home Management;Cryotherapy;Electrical Stimulation;Iontophoresis 4mg /ml Dexamethasone;Moist Heat;Gait training;DME Instruction;Stair training;Functional mobility training;Therapeutic activities;Therapeutic exercise;Patient/family education;Neuromuscular re-education;Balance training;Orthotic Fit/Training;Manual techniques;Passive range of motion;Scar mobilization;Manual lymph drainage;Dry needling;Energy conservation;Splinting;Taping    PT Next Visit Plan    PT Home Exercise Plan see above    Consulted and Agree with Plan of Care Patient             [name] PT  07/24/2022, 11:17 AM

## 2022-07-25 ENCOUNTER — Ambulatory Visit: Payer: 59

## 2022-07-31 NOTE — Therapy (Signed)
OUTPATIENT PHYSICAL THERAPY TREATMENT NOTE Patient Name: Douglas Adams MRN: [id number] DOB:[birth year], 22 y.o., male Today's Date: 08/01/2022  PCP: Saddie Benders MD  REFERRING PROVIDER: Saddie Benders MD   PT End of Session - 08/01/22 0910     Visit Number [id number]    Number of Visits 76    Date for PT Re-Evaluation 09/12/22    Authorization Type [employer] Employee; PN 11/28    Authorization Time Period 01/31/22-03/28/22    Progress Note Due on Visit 30    PT Start Time 0715    PT Stop Time 0759    PT Time Calculation (min) 44 min    Activity Tolerance Patient tolerated treatment well    Behavior During Therapy [facility] for tasks assessed/performed                   Past Medical History:  Diagnosis Date   Allergies    PONV (postoperative nausea and vomiting) 12/01/2021   pt's with extended recovery- vomiting x 3 hrs.   Past Surgical History:  Procedure Laterality Date   ADENOIDECTOMY AND MYRINGOTOMY WITH TUBE PLACEMENT Bilateral 2007   ANTERIOR CRUCIATE LIGAMENT REPAIR Right 12/01/2021   Procedure: RECONSTRUCTION ANTERIOR CRUCIATE LIGAMENT (ACL);  Surgeon: Hiram Gash, MD;  Location: [place];  Service: Orthopedics;  Laterality: Right;   KNEE ARTHROSCOPY Right 03/02/2022   Procedure: ARTHROSCOPY KNEE;  Surgeon: Hiram Gash, MD;  Location: [place];  Service: Orthopedics;  Laterality: Right;   LYSIS OF ADHESION Right 03/02/2022   Procedure: LYSIS OF ADHESION/MANIPULATION;  Surgeon: Hiram Gash, MD;  Location: [place];  Service: Orthopedics;  Laterality: Right;   There are no problems to display for this patient.  REFERRING DIAG: ACL reconstruction and arthroscopy of R knee  THERAPY DIAG:  Stiffness of right knee, not elsewhere classified  Acute pain of right knee  Difficulty in walking, not elsewhere classified  Rationale for Evaluation and Treatment Rehabilitation  PERTINENT HISTORY: Patient is a 22 year old male  s/p ACL reconstruction with meniscal repair 12/01/21. Patient was injured with a twisting injury while playing basketball where he felt a pop and was unable to bear weight. Protocol not provided however [name] has protocol from clinic: knee extension lock for one week, d/c brace by 4 weeks if quad control appropriate. Progress PROM AAROM and AROM as tolerated for 0-6 weeks; with limitation of 0-90 for first 4 weeks. Pt goes for manip under anesth on 03/02/22.   PRECAUTIONS: ACL protocol  SUBJECTIVE:  Patient has been compliant with strength training, tried golfing once.   PAIN:  Are you having pain? Yes: NPRS scale: 3-4/10 Pain location: Rt Patella, quad   Pain description: post surgical Aggravating factors: movement Relieving factors: elevating, ice, rest  INTERVENTION    Right Left  Hip flexion 4+ 5  Hip Abduction 4 5  Hip Adduction 4+ 5  Knee Extension  4 5  Knee Flexion 4 5  Hip IR 4+ 5  Hip ER 4 5    Warm Up:  Stationary Bike 4 minutes: Forward 2 and Backward 2   Treatment Return to running protocol: Ambulate 4.0 2 minutes, interval run progressively increasing speed by 0.2 with rest of 1 minute walk at 3.8; run speeds: 6.2, 6.4, 6.6, 6.8, 7.0 for 1 minute each.    Return to sport:   Activity Description: Patient sprints forward, quick deceleration to ghost guard, lateral tap cone, backwards run back to homebase;  Activity  Setting:  The [facility] setting was selected for the goal of improving spatial awareness and visual scanning ability, establishing a central point of reference during dynamic movements for enhanced balance and control.   Number of Pods:  5 Cycles/Sets:  2 Duration (Time or Hit Count):  10  Activity Description: catch basketball squat and hit pod    Activity Setting:  The [facility] [employer] setting was chosen to enhance cognitive processing and agility, providing an unpredictable environment to simulate real-world scenarios, and fostering quick  reactions and adaptability.   Number of Pods:  6 Cycles/Sets:  2 Duration (Time or Hit Count):  10   Ice cup massage 4 minutes   Trigger Point Dry Needling (TDN), unbilled Education performed with patient regarding potential benefit of TDN. Reviewed precautions and risks with patient. Reviewed special precautions/risks over lung fields which include pneumothorax. Reviewed signs and symptoms of pneumothorax and advised pt to go to ER immediately if these symptoms develop advise them of dry needling treatment. Extensive time spent with pt to ensure full understanding of TDN risks. Pt provided verbal consent to treatment. TDN performed to  with 0.25 x 40 single needle placements with local twitch response (LTR). Pistoning technique utilized. Improved pain-free motion following intervention. R hamstring and quad 3 minutes     PATIENT EDUCATION: Education details: exercise technique, body mechanics, protocol Person educated: Patient Education method: Explanation, Demonstration, Tactile cues, and Verbal cues Education comprehension: verbalized understanding, returned demonstration, verbal cues required, and tactile cues required  HOME EXERCISE PROGRAM: Access Code: [email or web address] URL: [email or web address] Date: 12/15/2021 Prepared by: [name]  Exercises- Long Sitting Quad Set  - 1 x daily - 7 x weekly - 2 sets - 10 reps - 5 hold - Active Straight Leg Raise with Quad Set  - 1 x daily - 7 x weekly - 2 sets - 10 reps - 5 hold - Mini Squat with Counter Support  - 1 x daily - 7 x weekly - 2 sets - 10 reps - 5 hold - Side Lunge with Counter Support  - 1 x daily - 7 x weekly - 2 sets - 10 reps - 5 hold - Seated Ankle Alphabet  - 1 x daily - 7 x weekly - 2 sets - 10 reps - 5 hold  Access Code: [email or web address] URL: [email or web address] Date: 07/18/2022 Prepared by: [name]  Exercises - Supine Bridge  - 1 x daily - 7 x weekly - 2 sets - 10 reps - 5 hold - Single Leg  Bridge  - 1 x daily - 7 x weekly - 2 sets - 10 reps - 5 hold - Split Squats Upright Trunk with Dumbbells (Quad Bias)  - 1 x daily - 7 x weekly - 2 sets - 10 reps - 5 hold - Wall Squat with Heel Raises  - 1 x daily - 7 x weekly - 2 sets - 10 reps - 5 hold - Full Leg Press  - 1 x daily - 7 x weekly - 3 sets - 10 reps - Eccentric Hamstring Curl with Weight Machine  - 1 x daily - 7 x weekly - 3 sets - 10 reps - Single Leg Knee Extension with Weight Machine  - 1 x daily - 7 x weekly - 3 sets - 10 reps   PT Short Term Goals       PT SHORT TERM GOAL #1   Title Patient will be independent in home exercise program to improve strength/mobility  for better functional independence with ADLs.    Baseline 6/12; HEP given 7/20 : HEP compliant    Time 4    Status Achieved   Target Date 01/02/22      PT SHORT TERM GOAL #2   Title Patient will report a worst pain of 5/10 on VAS in R knee to improve tolerance with ADLs and reduced symptoms with activities.    Baseline 6/12: 8/10 7/20: 6/10 8/8: 5/10    Time 4    Period Weeks    Status MET   Target Date 01/02/22      PT SHORT TERM GOAL #3   Title Patient will increase 10 meter walk test to >1.59m/s with LRAD as to improve gait speed for better community ambulation and to reduce fall risk.    Baseline 6/12:0.77 with crutches 7/20: 1.6 m/s    Time 4    Period Weeks    Status Achieved   Target Date 01/02/22      PT SHORT TERM GOAL #4   Title --    Baseline --    Time --    Period --    Status --    Target Date --      PT SHORT TERM GOAL #5   Title --    Baseline --    Time --    Period --    Status --    Target Date --              PT Long Term Goals      PT LONG TERM GOAL #1   Title Patient will increase FOTO score to equal to or greater than  71   to demonstrate statistically significant improvement in mobility and quality of life.    Baseline 6/12: 30 7/20: 51 8/8: 65% 8/31: 64% 10/5: 67%   Time 8 11/28: 71% 75%    Period  Weeks    Status  Met   Target Date 07/18/2022           PT LONG TERM GOAL #2   Title Patient will report a worst pain of 3/10 on VAS in  R knee  to improve tolerance with ADLs and reduced symptoms with activities.    Baseline 6/12: 8/10 pain 7/20: 6/10 8/8: 5/10 8/31: 6/10 10/5: 5/10 11/28: 5/10 1/4: 4/10 1/23: 3/10    Time 8    Period Weeks    Status MET   Target Date 07/18/2022           PT LONG TERM GOAL #3   Title Patient will increase lower extremity functional scale to >60/80 to demonstrate improved functional mobility and increased tolerance with ADLs.    Baseline 6/12: 19/80 7/20: 45/80 8/8: 49/80 8/31 55/80 10/5: 58/80 11/28: 66/80   Time 8    Period Weeks    Status  Met   Target Date 05/25/2022         PT LONG TERM GOAL #4   Title Patient will improve R knee ROM (neutral to 120 degrees) for return to PLOF and safe functional mobility.    Baseline 6/12: locked in extension 7/20: seated 85 supine AROM: 75    extension: AROM -5 PROM -2 8/8:  supine AROM 75 PROM 80;   seated: AROM 73 PROM 86 8/31: supine:AROM 75 PROM 84   sitting; AROM 79 PROM 92 10/5: 128 degrees   Time 8    Period Weeks    Status MET   Target Date  03/28/2022       PT LONG TERM GOAL #5   Title Patient will increase six minute walk test distance to >1000 for progression to community ambulator and improve gait ability    Baseline 6/12: unable to test 7/20: 1340 w/o brace 8/8: 1690 ft    Time 8    Period Weeks    Status MET   Target Date 03/28/2022     PT LONG TERM GOAL #6  Title Patient will return to sport and running 5 miles without pain increase for return to PLOF   Baseline 8/8: unable to play or run 8/31: unable to 10/5: not cleared for return to sport yet 11/28: Patient was able to run 0.5 mile without stopping  1/4: 0.8 miles without stopping 1/23: 1 mile in 9: 16 minutes   Time 8   Period Weeks   Status On going  Target Date 09/12/2022               PT LONG TERM GOAL  #7  Title Patient will increase BLE gross strength to 5/5 as to improve functional strength for independent gait, increased standing tolerance and increased ADL ability.  Baseline 10/5: see above 11/28: see above 1/23: see above  Time 8   Period Weeks   Status Partially Met  Target Date 09/12/2022             PT LONG TERM GOAL #8  Title Patient will increase single leg hop test on the (R) leg to an average of > 75 in to demonstrate symmetry between legs and the ability to hop in order to return to sport with full confidence.   Baseline 11/28: Right: 37.6, 39, 40 in (Average: 38.8 in) 1/4; 32. 41.5, 42 (average 38.5) 1/23: 36.5, 38.5, 41 (average 38.7)    Time 8  Period Weeks   Status Partially Met  Target Date 09/12/2022         PT LONG TERM GOAL #9  Title Patient will increase Y-test on the (R) leg to an average of > 31.5 in to demonstrate symmetry between legs and increased ability to maintain single leg balance.    Baseline 11/28:(R) Forward (23.25 in), Behind Leg (31 in), Lateral (33 in), Average (29 in) 1/4: Right leg: Forward (34 in), Behind Leg (38 in), Lateral (33.5 in) Average (35.2 in)  Time 8  Period Weeks   Status MET  Target Date 07/18/2022        Plan       Clinical Impression Statement Patient presents with excellent motivation, return to fast paced running tolerated well with slight increase in fatigue/soreness with increase in speed. Patient is tolerating dynamic return to sport interventions but remains challenged with posterior running and deceleration.  Patient will continue to benefit from skilled physical therapy interventions in order to improve right knee strength, overall confidence in sport activities, and overall functional capability of the RLE in order to return allow return to his previous level of function.   Personal Factors and Comorbidities Profession;Transportation    Examination-Activity Limitations Bathing;Bed Mobility;Bend;Caring for  Others;Carry;Dressing;Hygiene/Grooming;Lift;Locomotion Level;Toileting;Stand;Stairs;Squat;Sit;Transfers    Examination-Participation Restrictions Cleaning;Community Activity;Driving;Meal Prep;Laundry;Occupation;Shop;Yard Work;Volunteer    Stability/Clinical Decision Making Stable/Uncomplicated    Rehab Potential Good    PT Frequency 1x / week    PT Duration 8 weeks    PT Treatment/Interventions ADLs/Self Care Home Management;Cryotherapy;Electrical Stimulation;Iontophoresis 4mg /ml Dexamethasone;Moist Heat;Gait training;DME Instruction;Stair training;Functional mobility training;Therapeutic activities;Therapeutic exercise;Patient/family education;Neuromuscular re-education;Balance training;Orthotic Fit/Training;Manual techniques;Passive range of motion;Scar mobilization;Manual lymph drainage;Dry needling;Energy conservation;Splinting;Taping  PT Next Visit Plan    PT Home Exercise Plan see above    Consulted and Agree with Plan of Care Patient             Precious Bard PT  08/01/2022, 11:20 AM

## 2022-08-01 ENCOUNTER — Ambulatory Visit: Payer: 59 | Attending: Orthopaedic Surgery

## 2022-08-01 DIAGNOSIS — R262 Difficulty in walking, not elsewhere classified: Secondary | ICD-10-CM | POA: Insufficient documentation

## 2022-08-01 DIAGNOSIS — M25561 Pain in right knee: Secondary | ICD-10-CM | POA: Insufficient documentation

## 2022-08-01 DIAGNOSIS — M25661 Stiffness of right knee, not elsewhere classified: Secondary | ICD-10-CM | POA: Diagnosis not present

## 2022-08-07 NOTE — Therapy (Signed)
OUTPATIENT PHYSICAL THERAPY TREATMENT NOTE Patient Name: Douglas Adams MRN: [id number] DOB:2000/08/10, 22 y.o., male Today's Date: 08/08/2022  PCP: Saddie Benders MD  REFERRING PROVIDER: Saddie Benders MD   PT End of Session - 08/08/22 0715     Visit Number 30    Number of Visits 21    Date for PT Re-Evaluation 09/12/22    Authorization Type [employer] Employee; PN 11/28    Authorization Time Period 01/31/22-03/28/22    Progress Note Due on Visit 30    PT Start Time 0715    PT Stop Time 0759    PT Time Calculation (min) 44 min    Activity Tolerance Patient tolerated treatment well    Behavior During Therapy [facility] for tasks assessed/performed                    Past Medical History:  Diagnosis Date   Allergies    PONV (postoperative nausea and vomiting) 12/01/2021   pt's with extended recovery- vomiting x 3 hrs.   Past Surgical History:  Procedure Laterality Date   ADENOIDECTOMY AND MYRINGOTOMY WITH TUBE PLACEMENT Bilateral 2007   ANTERIOR CRUCIATE LIGAMENT REPAIR Right 12/01/2021   Procedure: RECONSTRUCTION ANTERIOR CRUCIATE LIGAMENT (ACL);  Surgeon: Hiram Gash, MD;  Location: [place];  Service: Orthopedics;  Laterality: Right;   KNEE ARTHROSCOPY Right 03/02/2022   Procedure: ARTHROSCOPY KNEE;  Surgeon: Hiram Gash, MD;  Location: [place];  Service: Orthopedics;  Laterality: Right;   LYSIS OF ADHESION Right 03/02/2022   Procedure: LYSIS OF ADHESION/MANIPULATION;  Surgeon: Hiram Gash, MD;  Location: [place];  Service: Orthopedics;  Laterality: Right;   There are no problems to display for this patient.  REFERRING DIAG: ACL reconstruction and arthroscopy of R knee  THERAPY DIAG:  Stiffness of right knee, not elsewhere classified  Difficulty in walking, not elsewhere classified  Acute pain of right knee  Rationale for Evaluation and Treatment Rehabilitation  PERTINENT HISTORY: Patient is a 22 year old  male s/p ACL reconstruction with meniscal repair 12/01/21. Patient was injured with a twisting injury while playing basketball where he felt a pop and was unable to bear weight. Protocol not provided however [name] has protocol from clinic: knee extension lock for one week, d/c brace by 4 weeks if quad control appropriate. Progress PROM AAROM and AROM as tolerated for 0-6 weeks; with limitation of 0-90 for first 4 weeks. Pt goes for manip under anesth on 03/02/22.   PRECAUTIONS: ACL protocol  SUBJECTIVE:  Patient reports some running but not a lot since last session, has been compliant with strengthening program.   PAIN:  Are you having pain? Yes: NPRS scale: 3-4/10 Pain location: Rt Patella, quad   Pain description: post surgical Aggravating factors: movement Relieving factors: elevating, ice, rest  INTERVENTION    Right Left  Hip flexion 4+ 5  Hip Abduction 4 5  Hip Adduction 4+ 5  Knee Extension  4 5  Knee Flexion 4 5  Hip IR 4+ 5  Hip ER 4 5    Warm Up:  Stationary Bike 4 minutes: Forward 2 and Backward 2   Treatment Return to running protocol: Ambulate 4.0 2 minutes, interval run progressively increasing speed by 0.2 with rest of 1 minute walk at 3.8; run speeds: 6.2, 6.4, 6.6, 6.8, 7.0 , 7.2, for 1 minute each.    Return to sport:   Activity Description: Resisted walking forward/backward, lateral L and R #27.5  Activity Setting:  The [employer] Random setting was chosen to enhance cognitive processing and agility, providing an unpredictable environment to simulate real-world scenarios, and fostering quick reactions and adaptability.   Number of Pods:  6 Cycles/Sets:  2 Duration (Time or Hit Count):  10  Bilateral LE jump 10x, single knee jump 10x R and L; 3 sets  LE jump squat 10x superset with curtsy lunge 5x each side; 2 sets   Ice cup massage 4 minutes   Trigger Point Dry Needling (TDN), unbilled Education performed with patient regarding potential benefit of TDN.  Reviewed precautions and risks with patient. Reviewed special precautions/risks over lung fields which include pneumothorax. Reviewed signs and symptoms of pneumothorax and advised pt to go to ER immediately if these symptoms develop advise them of dry needling treatment. Extensive time spent with pt to ensure full understanding of TDN risks. Pt provided verbal consent to treatment. TDN performed to  with 0.25 x 40 single needle placements with local twitch response (LTR). Pistoning technique utilized. Improved pain-free motion following intervention. L hamstring 1 minute     PATIENT EDUCATION: Education details: exercise technique, body mechanics, protocol Person educated: Patient Education method: Explanation, Demonstration, Tactile cues, and Verbal cues Education comprehension: verbalized understanding, returned demonstration, verbal cues required, and tactile cues required  HOME EXERCISE PROGRAM: Access Code: [email or web address] URL: [email or web address] Date: 12/15/2021 Prepared by: [name]  Exercises- Long Sitting Quad Set  - 1 x daily - 7 x weekly - 2 sets - 10 reps - 5 hold - Active Straight Leg Raise with Quad Set  - 1 x daily - 7 x weekly - 2 sets - 10 reps - 5 hold - Mini Squat with Counter Support  - 1 x daily - 7 x weekly - 2 sets - 10 reps - 5 hold - Side Lunge with Counter Support  - 1 x daily - 7 x weekly - 2 sets - 10 reps - 5 hold - Seated Ankle Alphabet  - 1 x daily - 7 x weekly - 2 sets - 10 reps - 5 hold  Access Code: [email or web address] URL: [email or web address] Date: 07/18/2022 Prepared by: [name]  Exercises - Supine Bridge  - 1 x daily - 7 x weekly - 2 sets - 10 reps - 5 hold - Single Leg Bridge  - 1 x daily - 7 x weekly - 2 sets - 10 reps - 5 hold - Split Squats Upright Trunk with Dumbbells (Quad Bias)  - 1 x daily - 7 x weekly - 2 sets - 10 reps - 5 hold - Wall Squat with Heel Raises  - 1 x daily - 7 x weekly - 2 sets - 10 reps - 5 hold -  Full Leg Press  - 1 x daily - 7 x weekly - 3 sets - 10 reps - Eccentric Hamstring Curl with Weight Machine  - 1 x daily - 7 x weekly - 3 sets - 10 reps - Single Leg Knee Extension with Weight Machine  - 1 x daily - 7 x weekly - 3 sets - 10 reps   PT Short Term Goals       PT SHORT TERM GOAL #1   Title Patient will be independent in home exercise program to improve strength/mobility for better functional independence with ADLs.    Baseline 6/12; HEP given 7/20 : HEP compliant    Time 4    Status Achieved   Target Date 01/02/22      PT  SHORT TERM GOAL #2   Title Patient will report a worst pain of 5/10 on VAS in R knee to improve tolerance with ADLs and reduced symptoms with activities.    Baseline 6/12: 8/10 7/20: 6/10 8/8: 5/10    Time 4    Period Weeks    Status MET   Target Date 01/02/22      PT SHORT TERM GOAL #3   Title Patient will increase 10 meter walk test to >1.45ms with LRAD as to improve gait speed for better community ambulation and to reduce fall risk.    Baseline 6/12:0.77 with crutches 7/20: 1.6 m/s    Time 4    Period Weeks    Status Achieved   Target Date 01/02/22      PT SHORT TERM GOAL #4   Title --    Baseline --    Time --    Period --    Status --    Target Date --      PT SHORT TERM GOAL #5   Title --    Baseline --    Time --    Period --    Status --    Target Date --              PT Long Term Goals      PT LONG TERM GOAL #1   Title Patient will increase FOTO score to equal to or greater than  71   to demonstrate statistically significant improvement in mobility and quality of life.    Baseline 6/12: 30 7/20: 51 8/8: 65% 8/31: 64% 10/5: 67%   Time 8 11/28: 71% 75%    Period Weeks    Status  Met   Target Date 07/18/2022           PT LONG TERM GOAL #2   Title Patient will report a worst pain of 3/10 on VAS in  R knee  to improve tolerance with ADLs and reduced symptoms with activities.    Baseline 6/12: 8/10 pain 7/20: 6/10  8/8: 5/10 8/31: 6/10 10/5: 5/10 11/28: 5/10 1/4: 4/10 1/23: 3/10    Time 8    Period Weeks    Status MET   Target Date 07/18/2022           PT LONG TERM GOAL #3   Title Patient will increase lower extremity functional scale to >60/80 to demonstrate improved functional mobility and increased tolerance with ADLs.    Baseline 6/12: 19/80 7/20: 45/80 8/8: 49/80 8/31 55/80 10/5: 58/80 11/28: 66/80   Time 8    Period Weeks    Status  Met   Target Date 05/25/2022         PT LONG TERM GOAL #4   Title Patient will improve R knee ROM (neutral to 120 degrees) for return to PLOF and safe functional mobility.    Baseline 6/12: locked in extension 7/20: seated 85 supine AROM: 75    extension: AROM -5 PROM -2 8/8:  supine AROM 75 PROM 80;   seated: AROM 73 PROM 86 8/31: supine:AROM 75 PROM 84   sitting; AROM 79 PROM 92 10/5: 128 degrees   Time 8    Period Weeks    Status MET   Target Date 03/28/2022       PT LONG TERM GOAL #5   Title Patient will increase six minute walk test distance to >1000 for progression to community ambulator and improve gait ability    Baseline  6/12: unable to test 7/20: 1340 w/o brace 8/8: 1690 ft    Time 8    Period Weeks    Status MET   Target Date 03/28/2022     PT LONG TERM GOAL #6  Title Patient will return to sport and running 5 miles without pain increase for return to PLOF   Baseline 8/8: unable to play or run 8/31: unable to 10/5: not cleared for return to sport yet 11/28: Patient was able to run 0.5 mile without stopping  1/4: 0.8 miles without stopping 1/23: 1 mile in 9: 16 minutes   Time 8   Period Weeks   Status On going  Target Date 09/12/2022               PT LONG TERM GOAL #7  Title Patient will increase BLE gross strength to 5/5 as to improve functional strength for independent gait, increased standing tolerance and increased ADL ability.  Baseline 10/5: see above 11/28: see above 1/23: see above  Time 8   Period Weeks   Status  Partially Met  Target Date 09/12/2022             PT LONG TERM GOAL #8  Title Patient will increase single leg hop test on the (R) leg to an average of > 75 in to demonstrate symmetry between legs and the ability to hop in order to return to sport with full confidence.   Baseline 11/28: Right: 37.6, 39, 40 in (Average: 38.8 in) 1/4; 32. 41.5, 42 (average 38.5) 1/23: 36.5, 38.5, 41 (average 38.7)    Time 8  Period Weeks   Status Partially Met  Target Date 09/12/2022         PT LONG TERM GOAL #9  Title Patient will increase Y-test on the (R) leg to an average of > 31.5 in to demonstrate symmetry between legs and increased ability to maintain single leg balance.    Baseline 11/28:(R) Forward (23.25 in), Behind Leg (31 in), Lateral (33 in), Average (29 in) 1/4: Right leg: Forward (34 in), Behind Leg (38 in), Lateral (33.5 in) Average (35.2 in)  Time 8  Period Weeks   Status MET  Target Date 07/18/2022        Plan       Clinical Impression Statement Patient presents with excellent motivation. He is very challenged with single limb right LE hopping, will continue to progress single limb return to sport tasks. Lateral stabilization performed as well as anterior and posterior without LOB only fatigue. Speed increase to 7.2 tolerated as well.  Patient will continue to benefit from skilled physical therapy interventions in order to improve right knee strength, overall confidence in sport activities, and overall functional capability of the RLE in order to return allow return to his previous level of function.   Personal Factors and Comorbidities Profession;Transportation    Examination-Activity Limitations Bathing;Bed Mobility;Bend;Caring for Others;Carry;Dressing;Hygiene/Grooming;Lift;Locomotion Level;Toileting;Stand;Stairs;Squat;Sit;Transfers    Examination-Participation Restrictions Cleaning;Community Activity;Driving;Meal Prep;Laundry;Occupation;Shop;Yard Work;Volunteer     Stability/Clinical Decision Making Stable/Uncomplicated    Rehab Potential Good    PT Frequency 1x / week    PT Duration 8 weeks    PT Treatment/Interventions ADLs/Self Care Home Management;Cryotherapy;Electrical Stimulation;Iontophoresis 47m/ml Dexamethasone;Moist Heat;Gait training;DME Instruction;Stair training;Functional mobility training;Therapeutic activities;Therapeutic exercise;Patient/family education;Neuromuscular re-education;Balance training;Orthotic Fit/Training;Manual techniques;Passive range of motion;Scar mobilization;Manual lymph drainage;Dry needling;Energy conservation;Splinting;Taping    PT Next Visit Plan    PT Home Exercise Plan see above    Consulted and Agree with Plan of Care Patient  [name] PT  08/08/2022, 8:18 AM

## 2022-08-08 ENCOUNTER — Ambulatory Visit: Payer: 59

## 2022-08-08 DIAGNOSIS — M25661 Stiffness of right knee, not elsewhere classified: Secondary | ICD-10-CM | POA: Diagnosis not present

## 2022-08-08 DIAGNOSIS — M25561 Pain in right knee: Secondary | ICD-10-CM

## 2022-08-08 DIAGNOSIS — R262 Difficulty in walking, not elsewhere classified: Secondary | ICD-10-CM | POA: Diagnosis not present

## 2022-08-15 ENCOUNTER — Ambulatory Visit: Payer: 59

## 2022-08-15 DIAGNOSIS — M25661 Stiffness of right knee, not elsewhere classified: Secondary | ICD-10-CM

## 2022-08-15 DIAGNOSIS — M25561 Pain in right knee: Secondary | ICD-10-CM

## 2022-08-15 DIAGNOSIS — R262 Difficulty in walking, not elsewhere classified: Secondary | ICD-10-CM | POA: Diagnosis not present

## 2022-08-15 NOTE — Therapy (Signed)
OUTPATIENT PHYSICAL THERAPY TREATMENT NOTE Patient Name: Douglas Adams MRN: [id number] DOB:[birth year], 22 y.o., male Today's Date: 08/15/2022  PCP: Saddie Benders MD  REFERRING PROVIDER: Saddie Benders MD   PT End of Session - 08/15/22 0756     Visit Number 60    Number of Visits 59    Date for PT Re-Evaluation 09/12/22    Authorization Type  Employee; PN 11/28    Authorization Time Period 01/31/22-03/28/22    Progress Note Due on Visit 30    PT Start Time 0715    PT Stop Time 0759    PT Time Calculation (min) 44 min    Activity Tolerance Patient tolerated treatment well    Behavior During Therapy [facility] for tasks assessed/performed                     Past Medical History:  Diagnosis Date   Allergies    PONV (postoperative nausea and vomiting) 12/01/2021   pt's with extended recovery- vomiting x 3 hrs.   Past Surgical History:  Procedure Laterality Date   ADENOIDECTOMY AND MYRINGOTOMY WITH TUBE PLACEMENT Bilateral 2007   ANTERIOR CRUCIATE LIGAMENT REPAIR Right 12/01/2021   Procedure: RECONSTRUCTION ANTERIOR CRUCIATE LIGAMENT (ACL);  Surgeon: Hiram Gash, MD;  Location: [place];  Service: Orthopedics;  Laterality: Right;   KNEE ARTHROSCOPY Right 03/02/2022   Procedure: ARTHROSCOPY KNEE;  Surgeon: Hiram Gash, MD;  Location: [place];  Service: Orthopedics;  Laterality: Right;   LYSIS OF ADHESION Right 03/02/2022   Procedure: LYSIS OF ADHESION/MANIPULATION;  Surgeon: Hiram Gash, MD;  Location: [place];  Service: Orthopedics;  Laterality: Right;   There are no problems to display for this patient.  REFERRING DIAG: ACL reconstruction and arthroscopy of R knee  THERAPY DIAG:  Stiffness of right knee, not elsewhere classified  Difficulty in walking, not elsewhere classified  Acute pain of right knee  Rationale for Evaluation and Treatment Rehabilitation  PERTINENT HISTORY: Patient is a 23 year old  male s/p ACL reconstruction with meniscal repair 12/01/21. Patient was injured with a twisting injury while playing basketball where he felt a pop and was unable to bear weight. Protocol not provided however [name] has protocol from clinic: knee extension lock for one week, d/c brace by 4 weeks if quad control appropriate. Progress PROM AAROM and AROM as tolerated for 0-6 weeks; with limitation of 0-90 for first 4 weeks. Pt goes for manip under anesth on 03/02/22.   PRECAUTIONS: ACL protocol  SUBJECTIVE:  Patient reports compliance with HEP, has not run though.    PAIN:  Are you having pain? Yes: NPRS scale: 3-4/10 Pain location: Rt Patella, quad   Pain description: post surgical Aggravating factors: movement Relieving factors: elevating, ice, rest  INTERVENTION    Right Left  Hip flexion 4+ 5  Hip Abduction 4 5  Hip Adduction 4+ 5  Knee Extension  4 5  Knee Flexion 4 5  Hip IR 4+ 5  Hip ER 4 5    Warm Up:  Stationary Bike 4 minutes: Forward 2 and Backward 2   Treatment Return to running protocol: Ambulate 4.0 2 minutes, interval run progressively increasing speed by 0.2 with rest of 1 minute walk at 3.8; run speeds:  6.6, 6.8, 7.0 , 7.2, 7.4 for 1 minute each.    Return to sport:   Activity Description: acceleration/decceleration to light up cone.  Activity Setting: [facility] setting  was selected for the goal of improving spatial awareness and visual scanning ability, establishing a central point of reference during dynamic movements for enhanced balance and control.     Number of Pods:  6 Cycles/Sets:  2 Duration (Time or Hit Count):  10  Activity Description: lateral shuffle to lit up cone tap with basketball  Activity Setting:  The [employer] Random setting was chosen to enhance cognitive processing and agility, providing an unpredictable environment to simulate real-world scenarios, and fostering quick reactions and adaptability.   Number of Pods:   6 Cycles/Sets:  2 Duration (Time or Hit Count):  10     Bilateral LE jump 10x, single knee jump 10x R and L; 3 sets  Herding turtles 50 ft x2 trials  SLD 40 ft x 2 trials  Ice cup massage 4 minutes      PATIENT EDUCATION: Education details: exercise technique, body mechanics, protocol Person educated: Patient Education method: Explanation, Demonstration, Tactile cues, and Verbal cues Education comprehension: verbalized understanding, returned demonstration, verbal cues required, and tactile cues required  HOME EXERCISE PROGRAM: Access Code: [email or web address] URL: [email or web address] Date: 12/15/2021 Prepared by: [name]  Exercises- Long Sitting Quad Set  - 1 x daily - 7 x weekly - 2 sets - 10 reps - 5 hold - Active Straight Leg Raise with Quad Set  - 1 x daily - 7 x weekly - 2 sets - 10 reps - 5 hold - Mini Squat with Counter Support  - 1 x daily - 7 x weekly - 2 sets - 10 reps - 5 hold - Side Lunge with Counter Support  - 1 x daily - 7 x weekly - 2 sets - 10 reps - 5 hold - Seated Ankle Alphabet  - 1 x daily - 7 x weekly - 2 sets - 10 reps - 5 hold  Access Code: [email or web address] URL: [email or web address] Date: 07/18/2022 Prepared by: [name]  Exercises - Supine Bridge  - 1 x daily - 7 x weekly - 2 sets - 10 reps - 5 hold - Single Leg Bridge  - 1 x daily - 7 x weekly - 2 sets - 10 reps - 5 hold - Split Squats Upright Trunk with Dumbbells (Quad Bias)  - 1 x daily - 7 x weekly - 2 sets - 10 reps - 5 hold - Wall Squat with Heel Raises  - 1 x daily - 7 x weekly - 2 sets - 10 reps - 5 hold - Full Leg Press  - 1 x daily - 7 x weekly - 3 sets - 10 reps - Eccentric Hamstring Curl with Weight Machine  - 1 x daily - 7 x weekly - 3 sets - 10 reps - Single Leg Knee Extension with Weight Machine  - 1 x daily - 7 x weekly - 3 sets - 10 reps   PT Short Term Goals       PT SHORT TERM GOAL #1   Title Patient will be independent in home exercise program to  improve strength/mobility for better functional independence with ADLs.    Baseline 6/12; HEP given 7/20 : HEP compliant    Time 4    Status Achieved   Target Date 01/02/22      PT SHORT TERM GOAL #2   Title Patient will report a worst pain of 5/10 on VAS in R knee to improve tolerance with ADLs and reduced symptoms with activities.    Baseline 6/12: 8/10 7/20: 6/10 8/8: 5/10  Time 4    Period Weeks    Status MET   Target Date 01/02/22      PT SHORT TERM GOAL #3   Title Patient will increase 10 meter walk test to >1.65ms with LRAD as to improve gait speed for better community ambulation and to reduce fall risk.    Baseline 6/12:0.77 with crutches 7/20: 1.6 m/s    Time 4    Period Weeks    Status Achieved   Target Date 01/02/22      PT SHORT TERM GOAL #4   Title --    Baseline --    Time --    Period --    Status --    Target Date --      PT SHORT TERM GOAL #5   Title --    Baseline --    Time --    Period --    Status --    Target Date --              PT Long Term Goals      PT LONG TERM GOAL #1   Title Patient will increase FOTO score to equal to or greater than  71   to demonstrate statistically significant improvement in mobility and quality of life.    Baseline 6/12: 30 7/20: 51 8/8: 65% 8/31: 64% 10/5: 67%   Time 8 11/28: 71% 75%    Period Weeks    Status  Met   Target Date 07/18/2022           PT LONG TERM GOAL #2   Title Patient will report a worst pain of 3/10 on VAS in  R knee  to improve tolerance with ADLs and reduced symptoms with activities.    Baseline 6/12: 8/10 pain 7/20: 6/10 8/8: 5/10 8/31: 6/10 10/5: 5/10 11/28: 5/10 1/4: 4/10 1/23: 3/10    Time 8    Period Weeks    Status MET   Target Date 07/18/2022           PT LONG TERM GOAL #3   Title Patient will increase lower extremity functional scale to >60/80 to demonstrate improved functional mobility and increased tolerance with ADLs.    Baseline 6/12: 19/80 7/20: 45/80 8/8:  49/80 8/31 55/80 10/5: 58/80 11/28: 66/80   Time 8    Period Weeks    Status  Met   Target Date 05/25/2022         PT LONG TERM GOAL #4   Title Patient will improve R knee ROM (neutral to 120 degrees) for return to PLOF and safe functional mobility.    Baseline 6/12: locked in extension 7/20: seated 85 supine AROM: 75    extension: AROM -5 PROM -2 8/8:  supine AROM 75 PROM 80;   seated: AROM 73 PROM 86 8/31: supine:AROM 75 PROM 84   sitting; AROM 79 PROM 92 10/5: 128 degrees   Time 8    Period Weeks    Status MET   Target Date 03/28/2022       PT LONG TERM GOAL #5   Title Patient will increase six minute walk test distance to >1000 for progression to community ambulator and improve gait ability    Baseline 6/12: unable to test 7/20: 1340 w/o brace 8/8: 1690 ft    Time 8    Period Weeks    Status MET   Target Date 03/28/2022     PT LONG TERM GOAL #6  Title Patient  will return to sport and running 5 miles without pain increase for return to PLOF   Baseline 8/8: unable to play or run 8/31: unable to 10/5: not cleared for return to sport yet 11/28: Patient was able to run 0.5 mile without stopping  1/4: 0.8 miles without stopping 1/23: 1 mile in 9: 16 minutes   Time 8   Period Weeks   Status On going  Target Date 09/12/2022               PT LONG TERM GOAL #7  Title Patient will increase BLE gross strength to 5/5 as to improve functional strength for independent gait, increased standing tolerance and increased ADL ability.  Baseline 10/5: see above 11/28: see above 1/23: see above  Time 8   Period Weeks   Status Partially Met  Target Date 09/12/2022             PT LONG TERM GOAL #8  Title Patient will increase single leg hop test on the (R) leg to an average of > 75 in to demonstrate symmetry between legs and the ability to hop in order to return to sport with full confidence.   Baseline 11/28: Right: 37.6, 39, 40 in (Average: 38.8 in) 1/4; 32. 41.5, 42  (average 38.5) 1/23: 36.5, 38.5, 41 (average 38.7)    Time 8  Period Weeks   Status Partially Met  Target Date 09/12/2022         PT LONG TERM GOAL #9  Title Patient will increase Y-test on the (R) leg to an average of > 31.5 in to demonstrate symmetry between legs and increased ability to maintain single leg balance.    Baseline 11/28:(R) Forward (23.25 in), Behind Leg (31 in), Lateral (33 in), Average (29 in) 1/4: Right leg: Forward (34 in), Behind Leg (38 in), Lateral (33.5 in) Average (35.2 in)  Time 8  Period Weeks   Status MET  Target Date 07/18/2022        Plan       Clinical Impression Statement Patient continues to progress with functional return to sport activities, increasing speed and agility on surgical limb. He does continue to have limited deceleration, especially backwards on surgical limb as well as single limb jumping.  Patient will continue to benefit from skilled physical therapy interventions in order to improve right knee strength, overall confidence in sport activities, and overall functional capability of the RLE in order to return allow return to his previous level of function.   Personal Factors and Comorbidities Profession;Transportation    Examination-Activity Limitations Bathing;Bed Mobility;Bend;Caring for Others;Carry;Dressing;Hygiene/Grooming;Lift;Locomotion Level;Toileting;Stand;Stairs;Squat;Sit;Transfers    Examination-Participation Restrictions Cleaning;Community Activity;Driving;Meal Prep;Laundry;Occupation;Shop;Yard Work;Volunteer    Stability/Clinical Decision Making Stable/Uncomplicated    Rehab Potential Good    PT Frequency 1x / week    PT Duration 8 weeks    PT Treatment/Interventions ADLs/Self Care Home Management;Cryotherapy;Electrical Stimulation;Iontophoresis 59m/ml Dexamethasone;Moist Heat;Gait training;DME Instruction;Stair training;Functional mobility training;Therapeutic activities;Therapeutic exercise;Patient/family  education;Neuromuscular re-education;Balance training;Orthotic Fit/Training;Manual techniques;Passive range of motion;Scar mobilization;Manual lymph drainage;Dry needling;Energy conservation;Splinting;Taping    PT Next Visit Plan    PT Home Exercise Plan see above    Consulted and Agree with Plan of Care Patient             MJanna ArchPT  08/15/2022, 7:59 AM

## 2022-08-21 NOTE — Therapy (Signed)
OUTPATIENT PHYSICAL THERAPY TREATMENT NOTE Patient Name: Douglas Adams MRN: [id number] DOB:2001/06/05, 22 y.o., male Today's Date: 08/22/2022  PCP: Saddie Benders MD  REFERRING PROVIDER: Saddie Benders MD   PT End of Session - 08/22/22 0714     Visit Number [id number]    Number of Visits 53    Date for PT Re-Evaluation 09/12/22    Authorization Type [employer] Employee; PN 11/28    Authorization Time Period 01/31/22-03/28/22    Progress Note Due on Visit 30    PT Start Time 0715    PT Stop Time 0759    PT Time Calculation (min) 44 min    Activity Tolerance Patient tolerated treatment well    Behavior During Therapy [facility] for tasks assessed/performed                      Past Medical History:  Diagnosis Date   Allergies    PONV (postoperative nausea and vomiting) 12/01/2021   pt's with extended recovery- vomiting x 3 hrs.   Past Surgical History:  Procedure Laterality Date   ADENOIDECTOMY AND MYRINGOTOMY WITH TUBE PLACEMENT Bilateral 2007   ANTERIOR CRUCIATE LIGAMENT REPAIR Right 12/01/2021   Procedure: RECONSTRUCTION ANTERIOR CRUCIATE LIGAMENT (ACL);  Surgeon: Hiram Gash, MD;  Location: [place];  Service: Orthopedics;  Laterality: Right;   KNEE ARTHROSCOPY Right 03/02/2022   Procedure: ARTHROSCOPY KNEE;  Surgeon: Hiram Gash, MD;  Location: [place];  Service: Orthopedics;  Laterality: Right;   LYSIS OF ADHESION Right 03/02/2022   Procedure: LYSIS OF ADHESION/MANIPULATION;  Surgeon: Hiram Gash, MD;  Location: [place];  Service: Orthopedics;  Laterality: Right;   There are no problems to display for this patient.  REFERRING DIAG: ACL reconstruction and arthroscopy of R knee  THERAPY DIAG:  Stiffness of right knee, not elsewhere classified  Difficulty in walking, not elsewhere classified  Acute pain of right knee  Rationale for Evaluation and Treatment Rehabilitation  PERTINENT HISTORY: Patient is a 22 year old  male s/p ACL reconstruction with meniscal repair 12/01/21. Patient was injured with a twisting injury while playing basketball where he felt a pop and was unable to bear weight. Protocol not provided however [name] has protocol from clinic: knee extension lock for one week, d/c brace by 4 weeks if quad control appropriate. Progress PROM AAROM and AROM as tolerated for 0-6 weeks; with limitation of 0-90 for first 4 weeks. Pt goes for manip under anesth on 03/02/22.   PRECAUTIONS: ACL protocol  SUBJECTIVE:  Patient has been golfing and running since last session  PAIN:  Are you having pain? Yes: NPRS scale: 1/10 Pain location: Rt Patella, quad   Pain description: post surgical Aggravating factors: movement Relieving factors: elevating, ice, rest  INTERVENTION    Right Left  Hip flexion 4+ 5  Hip Abduction 4 5  Hip Adduction 4+ 5  Knee Extension  4 5  Knee Flexion 4 5  Hip IR 4+ 5  Hip ER 4 5    Warm Up:  Stationary Bike 4 minutes: Forward 2 and Backward 2   Treatment   Return to sport/dynamic for single leg plyometrics -Lateral jump to vertical jump (mock shooting) 10x 3 sets -Zig Zag/skier jump on outside leg as high as you can 40 ft x 3 sets Single leg hop onto green step 10x 3 sets   Herding turtles 50 ft x2 trials  SLD 40 ft x 2 trials  Ice  cup massage 4 minutes  Trigger Point Dry Needling (TDN), unbilled Education performed with patient regarding potential benefit of TDN. Reviewed precautions and risks with patient. Reviewed special precautions/risks over lung fields which include pneumothorax. Reviewed signs and symptoms of pneumothorax and advised pt to go to ER immediately if these symptoms develop advise them of dry needling treatment. Extensive time spent with pt to ensure full understanding of TDN risks. Pt provided verbal consent to treatment. TDN performed to  with 0.25 x 40 single needle placements with local twitch response (LTR). Pistoning technique utilized.  Improved pain-free motion following intervention. R quad x 2 minutes       PATIENT EDUCATION: Education details: exercise technique, body mechanics, protocol Person educated: Patient Education method: Explanation, Demonstration, Tactile cues, and Verbal cues Education comprehension: verbalized understanding, returned demonstration, verbal cues required, and tactile cues required  HOME EXERCISE PROGRAM: Access Code: [email or web address] URL: [email or web address] Date: 12/15/2021 Prepared by: [name]  Exercises- Long Sitting Quad Set  - 1 x daily - 7 x weekly - 2 sets - 10 reps - 5 hold - Active Straight Leg Raise with Quad Set  - 1 x daily - 7 x weekly - 2 sets - 10 reps - 5 hold - Mini Squat with Counter Support  - 1 x daily - 7 x weekly - 2 sets - 10 reps - 5 hold - Side Lunge with Counter Support  - 1 x daily - 7 x weekly - 2 sets - 10 reps - 5 hold - Seated Ankle Alphabet  - 1 x daily - 7 x weekly - 2 sets - 10 reps - 5 hold  Access Code: [email or web address] URL: [email or web address] Date: 07/18/2022 Prepared by: [name]  Exercises - Supine Bridge  - 1 x daily - 7 x weekly - 2 sets - 10 reps - 5 hold - Single Leg Bridge  - 1 x daily - 7 x weekly - 2 sets - 10 reps - 5 hold - Split Squats Upright Trunk with Dumbbells (Quad Bias)  - 1 x daily - 7 x weekly - 2 sets - 10 reps - 5 hold - Wall Squat with Heel Raises  - 1 x daily - 7 x weekly - 2 sets - 10 reps - 5 hold - Full Leg Press  - 1 x daily - 7 x weekly - 3 sets - 10 reps - Eccentric Hamstring Curl with Weight Machine  - 1 x daily - 7 x weekly - 3 sets - 10 reps - Single Leg Knee Extension with Weight Machine  - 1 x daily - 7 x weekly - 3 sets - 10 reps   PT Short Term Goals       PT SHORT TERM GOAL #1   Title Patient will be independent in home exercise program to improve strength/mobility for better functional independence with ADLs.    Baseline 6/12; HEP given 7/20 : HEP compliant    Time 4     Status Achieved   Target Date 01/02/22      PT SHORT TERM GOAL #2   Title Patient will report a worst pain of 5/10 on VAS in R knee to improve tolerance with ADLs and reduced symptoms with activities.    Baseline 6/12: 8/10 7/20: 6/10 8/8: 5/10    Time 4    Period Weeks    Status MET   Target Date 01/02/22      PT SHORT TERM GOAL #3   Title Patient will increase  10 meter walk test to >1.69ms with LRAD as to improve gait speed for better community ambulation and to reduce fall risk.    Baseline 6/12:0.77 with crutches 7/20: 1.6 m/s    Time 4    Period Weeks    Status Achieved   Target Date 01/02/22      PT SHORT TERM GOAL #4   Title --    Baseline --    Time --    Period --    Status --    Target Date --      PT SHORT TERM GOAL #5   Title --    Baseline --    Time --    Period --    Status --    Target Date --              PT Long Term Goals      PT LONG TERM GOAL #1   Title Patient will increase FOTO score to equal to or greater than  71   to demonstrate statistically significant improvement in mobility and quality of life.    Baseline 6/12: 30 7/20: 51 8/8: 65% 8/31: 64% 10/5: 67%   Time 8 11/28: 71% 75%    Period Weeks    Status  Met   Target Date 07/18/2022           PT LONG TERM GOAL #2   Title Patient will report a worst pain of 3/10 on VAS in  R knee  to improve tolerance with ADLs and reduced symptoms with activities.    Baseline 6/12: 8/10 pain 7/20: 6/10 8/8: 5/10 8/31: 6/10 10/5: 5/10 11/28: 5/10 1/4: 4/10 1/23: 3/10    Time 8    Period Weeks    Status MET   Target Date 07/18/2022           PT LONG TERM GOAL #3   Title Patient will increase lower extremity functional scale to >60/80 to demonstrate improved functional mobility and increased tolerance with ADLs.    Baseline 6/12: 19/80 7/20: 45/80 8/8: 49/80 8/31 55/80 10/5: 58/80 11/28: 66/80   Time 8    Period Weeks    Status  Met   Target Date 05/25/2022         PT LONG TERM  GOAL #4   Title Patient will improve R knee ROM (neutral to 120 degrees) for return to PLOF and safe functional mobility.    Baseline 6/12: locked in extension 7/20: seated 85 supine AROM: 75    extension: AROM -5 PROM -2 8/8:  supine AROM 75 PROM 80;   seated: AROM 73 PROM 86 8/31: supine:AROM 75 PROM 84   sitting; AROM 79 PROM 92 10/5: 128 degrees   Time 8    Period Weeks    Status MET   Target Date 03/28/2022       PT LONG TERM GOAL #5   Title Patient will increase six minute walk test distance to >1000 for progression to community ambulator and improve gait ability    Baseline 6/12: unable to test 7/20: 1340 w/o brace 8/8: 1690 ft    Time 8    Period Weeks    Status MET   Target Date 03/28/2022     PT LONG TERM GOAL #6  Title Patient will return to sport and running 5 miles without pain increase for return to PLOF   Baseline 8/8: unable to play or run 8/31: unable to 10/5: not cleared for return to  sport yet 11/28: Patient was able to run 0.5 mile without stopping  1/4: 0.8 miles without stopping 1/23: 1 mile in 9: 16 minutes   Time 8   Period Weeks   Status On going  Target Date 09/12/2022               PT LONG TERM GOAL #7  Title Patient will increase BLE gross strength to 5/5 as to improve functional strength for independent gait, increased standing tolerance and increased ADL ability.  Baseline 10/5: see above 11/28: see above 1/23: see above  Time 8   Period Weeks   Status Partially Met  Target Date 09/12/2022             PT LONG TERM GOAL #8  Title Patient will increase single leg hop test on the (R) leg to an average of > 75 in to demonstrate symmetry between legs and the ability to hop in order to return to sport with full confidence.   Baseline 11/28: Right: 37.6, 39, 40 in (Average: 38.8 in) 1/4; 32. 41.5, 42 (average 38.5) 1/23: 36.5, 38.5, 41 (average 38.7)    Time 8  Period Weeks   Status Partially Met  Target Date 09/12/2022         PT  LONG TERM GOAL #9  Title Patient will increase Y-test on the (R) leg to an average of > 31.5 in to demonstrate symmetry between legs and increased ability to maintain single leg balance.    Baseline 11/28:(R) Forward (23.25 in), Behind Leg (31 in), Lateral (33 in), Average (29 in) 1/4: Right leg: Forward (34 in), Behind Leg (38 in), Lateral (33.5 in) Average (35.2 in)  Time 8  Period Weeks   Status MET  Target Date 07/18/2022        Plan       Clinical Impression Statement Patient tolerates return to sport jumping well this session. He is very challenged with single leg hop onto 2" step but demonstrates good body awareness and need for rest breaks. Ambulation between sets to perform dynamic rest break performed.  Patient will continue to benefit from skilled physical therapy interventions in order to improve right knee strength, overall confidence in sport activities, and overall functional capability of the RLE in order to return allow return to his previous level of function.   Personal Factors and Comorbidities Profession;Transportation    Examination-Activity Limitations Bathing;Bed Mobility;Bend;Caring for Others;Carry;Dressing;Hygiene/Grooming;Lift;Locomotion Level;Toileting;Stand;Stairs;Squat;Sit;Transfers    Examination-Participation Restrictions Cleaning;Community Activity;Driving;Meal Prep;Laundry;Occupation;Shop;Yard Work;Volunteer    Stability/Clinical Decision Making Stable/Uncomplicated    Rehab Potential Good    PT Frequency 1x / week    PT Duration 8 weeks    PT Treatment/Interventions ADLs/Self Care Home Management;Cryotherapy;Electrical Stimulation;Iontophoresis '4mg'$ /ml Dexamethasone;Moist Heat;Gait training;DME Instruction;Stair training;Functional mobility training;Therapeutic activities;Therapeutic exercise;Patient/family education;Neuromuscular re-education;Balance training;Orthotic Fit/Training;Manual techniques;Passive range of motion;Scar mobilization;Manual lymph  drainage;Dry needling;Energy conservation;Splinting;Taping    PT Next Visit Plan    PT Home Exercise Plan see above    Consulted and Agree with Plan of Care Patient             [name] PT  08/22/2022, 7:59 AM

## 2022-08-22 ENCOUNTER — Ambulatory Visit: Payer: 59

## 2022-08-22 DIAGNOSIS — M25661 Stiffness of right knee, not elsewhere classified: Secondary | ICD-10-CM

## 2022-08-22 DIAGNOSIS — R262 Difficulty in walking, not elsewhere classified: Secondary | ICD-10-CM | POA: Diagnosis not present

## 2022-08-22 DIAGNOSIS — M25561 Pain in right knee: Secondary | ICD-10-CM

## 2022-08-28 NOTE — Therapy (Signed)
OUTPATIENT PHYSICAL THERAPY TREATMENT NOTE/ Physical Therapy Progress Note   Dates of reporting period  06/29/22   to   08/29/22  Patient Name: Douglas Adams MRN: [id number] DOB:[birth year], 22 y.o., male Today's Date: 08/29/2022  PCP: Saddie Benders MD  REFERRING PROVIDER: Saddie Benders MD   PT End of Session - 08/29/22 0714     Visit Number 60    Number of Visits 36    Date for PT Re-Evaluation 09/12/22    Authorization Type [employer] Employee; PN 11/28    Authorization Time Period 01/31/22-03/28/22    Progress Note Due on Visit 30    PT Start Time 0715    PT Stop Time 0759    PT Time Calculation (min) 44 min    Activity Tolerance Patient tolerated treatment well    Behavior During Therapy [facility] for tasks assessed/performed                       Past Medical History:  Diagnosis Date   Allergies    PONV (postoperative nausea and vomiting) 12/01/2021   pt's with extended recovery- vomiting x 3 hrs.   Past Surgical History:  Procedure Laterality Date   ADENOIDECTOMY AND MYRINGOTOMY WITH TUBE PLACEMENT Bilateral 2007   ANTERIOR CRUCIATE LIGAMENT REPAIR Right 12/01/2021   Procedure: RECONSTRUCTION ANTERIOR CRUCIATE LIGAMENT (ACL);  Surgeon: Hiram Gash, MD;  Location: [place];  Service: Orthopedics;  Laterality: Right;   KNEE ARTHROSCOPY Right 03/02/2022   Procedure: ARTHROSCOPY KNEE;  Surgeon: Hiram Gash, MD;  Location: [place];  Service: Orthopedics;  Laterality: Right;   LYSIS OF ADHESION Right 03/02/2022   Procedure: LYSIS OF ADHESION/MANIPULATION;  Surgeon: Hiram Gash, MD;  Location: [place];  Service: Orthopedics;  Laterality: Right;   There are no problems to display for this patient.  REFERRING DIAG: ACL reconstruction and arthroscopy of R knee  THERAPY DIAG:  Stiffness of right knee, not elsewhere classified  Difficulty in walking, not elsewhere classified  Acute pain of right knee  Rationale for  Evaluation and Treatment Rehabilitation  PERTINENT HISTORY: Patient is a 22 year old male s/p ACL reconstruction with meniscal repair 12/01/21. Patient was injured with a twisting injury while playing basketball where he felt a pop and was unable to bear weight. Protocol not provided however [name] has protocol from clinic: knee extension lock for one week, d/c brace by 4 weeks if quad control appropriate. Progress PROM AAROM and AROM as tolerated for 0-6 weeks; with limitation of 0-90 for first 4 weeks. Pt goes for manip under anesth on 03/02/22.   PRECAUTIONS: ACL protocol  SUBJECTIVE:  Patient has been compliant with HEP. Has been to the gym twice since last session.   PAIN:  Are you having pain? Yes: NPRS scale: 1/10 Pain location: Rt Patella, quad   Pain description: post surgical Aggravating factors: movement Relieving factors: elevating, ice, rest  INTERVENTION    Right Left  Hip flexion 4+ 5  Hip Abduction 4 5  Hip Adduction 4+ 5  Knee Extension  4 5  Knee Flexion 4 5  Hip IR 4+ 5  Hip ER 4 5   Goal performed: see below   Warm Up:  Stationary Bike 4 minutes: Forward 2 and Backward 2   Treatment   Return to sport/dynamic for single leg plyometrics Jump down land 10x 2 sets Forward jump down single leg 10x; 2 sets Forward jump down to forward jump  10x ;   -Lateral jump to vertical jump (mock shooting) 10x       Herding turtles 50 ft x2 trials  SLD 40 ft x 2 trials  Ice cup massage 4 minutes  Trigger Point Dry Needling (TDN), unbilled Education performed with patient regarding potential benefit of TDN. Reviewed precautions and risks with patient. Reviewed special precautions/risks over lung fields which include pneumothorax. Reviewed signs and symptoms of pneumothorax and advised pt to go to ER immediately if these symptoms develop advise them of dry needling treatment. Extensive time spent with pt to ensure full understanding of TDN risks. Pt provided verbal  consent to treatment. TDN performed to  with 0.25 x 40 single needle placements with local twitch response (LTR). Pistoning technique utilized. Improved pain-free motion following intervention. R quad x 2 minutes       PATIENT EDUCATION: Education details: exercise technique, body mechanics, protocol Person educated: Patient Education method: Explanation, Demonstration, Tactile cues, and Verbal cues Education comprehension: verbalized understanding, returned demonstration, verbal cues required, and tactile cues required  HOME EXERCISE PROGRAM: Access Code: [email or web address] URL: [email or web address] Date: 12/15/2021 Prepared by: [name]  Exercises- Long Sitting Quad Set  - 1 x daily - 7 x weekly - 2 sets - 10 reps - 5 hold - Active Straight Leg Raise with Quad Set  - 1 x daily - 7 x weekly - 2 sets - 10 reps - 5 hold - Mini Squat with Counter Support  - 1 x daily - 7 x weekly - 2 sets - 10 reps - 5 hold - Side Lunge with Counter Support  - 1 x daily - 7 x weekly - 2 sets - 10 reps - 5 hold - Seated Ankle Alphabet  - 1 x daily - 7 x weekly - 2 sets - 10 reps - 5 hold  Access Code: [email or web address] URL: [email or web address] Date: 07/18/2022 Prepared by: [name]  Exercises - Supine Bridge  - 1 x daily - 7 x weekly - 2 sets - 10 reps - 5 hold - Single Leg Bridge  - 1 x daily - 7 x weekly - 2 sets - 10 reps - 5 hold - Split Squats Upright Trunk with Dumbbells (Quad Bias)  - 1 x daily - 7 x weekly - 2 sets - 10 reps - 5 hold - Wall Squat with Heel Raises  - 1 x daily - 7 x weekly - 2 sets - 10 reps - 5 hold - Full Leg Press  - 1 x daily - 7 x weekly - 3 sets - 10 reps - Eccentric Hamstring Curl with Weight Machine  - 1 x daily - 7 x weekly - 3 sets - 10 reps - Single Leg Knee Extension with Weight Machine  - 1 x daily - 7 x weekly - 3 sets - 10 reps   PT Short Term Goals       PT SHORT TERM GOAL #1   Title Patient will be independent in home exercise  program to improve strength/mobility for better functional independence with ADLs.    Baseline 6/12; HEP given 7/20 : HEP compliant    Time 4    Status Achieved   Target Date 01/02/22      PT SHORT TERM GOAL #2   Title Patient will report a worst pain of 5/10 on VAS in R knee to improve tolerance with ADLs and reduced symptoms with activities.    Baseline 6/12: 8/10 7/20: 6/10 8/8: 5/10  Time 4    Period Weeks    Status MET   Target Date 01/02/22      PT SHORT TERM GOAL #3   Title Patient will increase 10 meter walk test to >1.71ms with LRAD as to improve gait speed for better community ambulation and to reduce fall risk.    Baseline 6/12:0.77 with crutches 7/20: 1.6 m/s    Time 4    Period Weeks    Status Achieved   Target Date 01/02/22      PT SHORT TERM GOAL #4   Title --    Baseline --    Time --    Period --    Status --    Target Date --      PT SHORT TERM GOAL #5   Title --    Baseline --    Time --    Period --    Status --    Target Date --              PT Long Term Goals      PT LONG TERM GOAL #1   Title Patient will increase FOTO score to equal to or greater than  71   to demonstrate statistically significant improvement in mobility and quality of life.    Baseline 6/12: 30 7/20: 51 8/8: 65% 8/31: 64% 10/5: 67%   Time 8 11/28: 71% 75%    Period Weeks    Status  Met   Target Date 07/18/2022           PT LONG TERM GOAL #2   Title Patient will report a worst pain of 3/10 on VAS in  R knee  to improve tolerance with ADLs and reduced symptoms with activities.    Baseline 6/12: 8/10 pain 7/20: 6/10 8/8: 5/10 8/31: 6/10 10/5: 5/10 11/28: 5/10 1/4: 4/10 1/23: 3/10    Time 8    Period Weeks    Status MET   Target Date 07/18/2022           PT LONG TERM GOAL #3   Title Patient will increase lower extremity functional scale to >60/80 to demonstrate improved functional mobility and increased tolerance with ADLs.    Baseline 6/12: 19/80 7/20:  45/80 8/8: 49/80 8/31 55/80 10/5: 58/80 11/28: 66/80   Time 8    Period Weeks    Status  Met   Target Date 05/25/2022         PT LONG TERM GOAL #4   Title Patient will improve R knee ROM (neutral to 120 degrees) for return to PLOF and safe functional mobility.    Baseline 6/12: locked in extension 7/20: seated 85 supine AROM: 75    extension: AROM -5 PROM -2 8/8:  supine AROM 75 PROM 80;   seated: AROM 73 PROM 86 8/31: supine:AROM 75 PROM 84   sitting; AROM 79 PROM 92 10/5: 128 degrees   Time 8    Period Weeks    Status MET   Target Date 03/28/2022       PT LONG TERM GOAL #5   Title Patient will increase six minute walk test distance to >1000 for progression to community ambulator and improve gait ability    Baseline 6/12: unable to test 7/20: 1340 w/o brace 8/8: 1690 ft    Time 8    Period Weeks    Status MET   Target Date 03/28/2022     PT LONG TERM GOAL #6  Title Patient  will return to sport and running 5 miles without pain increase for return to PLOF   Baseline 8/8: unable to play or run 8/31: unable to 10/5: not cleared for return to sport yet 11/28: Patient was able to run 0.5 mile without stopping  1/4: 0.8 miles without stopping 1/23: 1 mile in 9: 16 minutes  3/5: 1.25 miles in 9.5 minutes   Time 8   Period Weeks   Status On going  Target Date 09/12/2022               PT LONG TERM GOAL #7  Title Patient will increase BLE gross strength to 5/5 as to improve functional strength for independent gait, increased standing tolerance and increased ADL ability.  Baseline 10/5: see above 11/28: see above 1/23: see above  Time 8   Period Weeks   Status Partially Met  Target Date 09/12/2022             PT LONG TERM GOAL #8  Title Patient will increase single leg hop test on the (R) leg to an average of > 75 in to demonstrate symmetry between legs and the ability to hop in order to return to sport with full confidence.   Baseline 11/28: Right: 37.6, 39, 40 in  (Average: 38.8 in) 1/4; 32. 41.5, 42 (average 38.5) 1/23: 36.5, 38.5, 41 (average 38.7)   3/5: 36, 43, 47 (average 42)   Time 8  Period Weeks   Status Partially Met  Target Date 09/12/2022         PT LONG TERM GOAL #9  Title Patient will increase Y-test on the (R) leg to an average of > 31.5 in to demonstrate symmetry between legs and increased ability to maintain single leg balance.    Baseline 11/28:(R) Forward (23.25 in), Behind Leg (31 in), Lateral (33 in), Average (29 in) 1/4: Right leg: Forward (34 in), Behind Leg (38 in), Lateral (33.5 in) Average (35.2 in)  Time 8  Period Weeks   Status MET  Target Date 07/18/2022        Plan       Clinical Impression Statement Patient makes good progress with increased single leg hop and stability on surgical limb. His ability to run is progressing with run performed at 6. 10mh for 1.25 miles.  Patient's condition has the potential to improve in response to therapy. Maximum improvement is yet to be obtained. The anticipated improvement is attainable and reasonable in a generally predictable time.    Patient will continue to benefit from skilled physical therapy interventions in order to improve right knee strength, overall confidence in sport activities, and overall functional capability of the RLE in order to return allow return to his previous level of function.   Personal Factors and Comorbidities Profession;Transportation    Examination-Activity Limitations Bathing;Bed Mobility;Bend;Caring for Others;Carry;Dressing;Hygiene/Grooming;Lift;Locomotion Level;Toileting;Stand;Stairs;Squat;Sit;Transfers    Examination-Participation Restrictions Cleaning;Community Activity;Driving;Meal Prep;Laundry;Occupation;Shop;Yard Work;Volunteer    Stability/Clinical Decision Making Stable/Uncomplicated    Rehab Potential Good    PT Frequency 1x / week    PT Duration 8 weeks    PT Treatment/Interventions ADLs/Self Care Home  Management;Cryotherapy;Electrical Stimulation;Iontophoresis '4mg'$ /ml Dexamethasone;Moist Heat;Gait training;DME Instruction;Stair training;Functional mobility training;Therapeutic activities;Therapeutic exercise;Patient/family education;Neuromuscular re-education;Balance training;Orthotic Fit/Training;Manual techniques;Passive range of motion;Scar mobilization;Manual lymph drainage;Dry needling;Energy conservation;Splinting;Taping    PT Next Visit Plan    PT Home Exercise Plan see above    Consulted and Agree with Plan of Care Patient             MJanna ArchPT  08/29/2022, 8:00 AM

## 2022-08-29 ENCOUNTER — Ambulatory Visit: Payer: 59 | Attending: Orthopaedic Surgery

## 2022-08-29 DIAGNOSIS — M25661 Stiffness of right knee, not elsewhere classified: Secondary | ICD-10-CM | POA: Diagnosis not present

## 2022-08-29 DIAGNOSIS — M25561 Pain in right knee: Secondary | ICD-10-CM | POA: Insufficient documentation

## 2022-08-29 DIAGNOSIS — R262 Difficulty in walking, not elsewhere classified: Secondary | ICD-10-CM | POA: Diagnosis not present

## 2022-09-01 DIAGNOSIS — S83281D Other tear of lateral meniscus, current injury, right knee, subsequent encounter: Secondary | ICD-10-CM | POA: Diagnosis not present

## 2022-09-04 NOTE — Therapy (Signed)
OUTPATIENT PHYSICAL THERAPY TREATMENT NOTE/ Physical Therapy Progress Note   Dates of reporting period  06/29/22   to   08/29/22  Patient Name: Douglas Adams MRN: [id number] DOB:May 28, 2001, 22 y.o., male Today's Date: 09/04/2022  PCP: Saddie Benders MD  REFERRING PROVIDER: Saddie Benders MD               Past Medical History:  Diagnosis Date   Allergies    PONV (postoperative nausea and vomiting) 12/01/2021   pt's with extended recovery- vomiting x 3 hrs.   Past Surgical History:  Procedure Laterality Date   ADENOIDECTOMY AND MYRINGOTOMY WITH TUBE PLACEMENT Bilateral 2007   ANTERIOR CRUCIATE LIGAMENT REPAIR Right 12/01/2021   Procedure: RECONSTRUCTION ANTERIOR CRUCIATE LIGAMENT (ACL);  Surgeon: Hiram Gash, MD;  Location: [facility];  Service: Orthopedics;  Laterality: Right;   KNEE ARTHROSCOPY Right 03/02/2022   Procedure: ARTHROSCOPY KNEE;  Surgeon: Hiram Gash, MD;  Location: [facility];  Service: Orthopedics;  Laterality: Right;   LYSIS OF ADHESION Right 03/02/2022   Procedure: LYSIS OF ADHESION/MANIPULATION;  Surgeon: Hiram Gash, MD;  Location: [facility];  Service: Orthopedics;  Laterality: Right;   There are no problems to display for this patient.  REFERRING DIAG: ACL reconstruction and arthroscopy of R knee  THERAPY DIAG:  No diagnosis found.  Rationale for Evaluation and Treatment Rehabilitation  PERTINENT HISTORY: Patient is a 22 year old male s/p ACL reconstruction with meniscal repair 12/01/21. Patient was injured with a twisting injury while playing basketball where he felt a pop and was unable to bear weight. Protocol not provided however [name] has protocol from clinic: knee extension lock for one week, d/c brace by 4 weeks if quad control appropriate. Progress PROM AAROM and AROM as tolerated for 0-6 weeks; with limitation of 0-90 for first 4 weeks. Pt goes for manip under anesth on 03/02/22.   PRECAUTIONS: ACL  protocol  SUBJECTIVE:  Patient has been compliant with HEP. Has been to the gym twice since last session.   PAIN:  Are you having pain? Yes: NPRS scale: 1/10 Pain location: Rt Patella, quad   Pain description: post surgical Aggravating factors: movement Relieving factors: elevating, ice, rest  INTERVENTION    Right Left  Hip flexion 4+ 5  Hip Abduction 4 5  Hip Adduction 4+ 5  Knee Extension  4 5  Knee Flexion 4 5  Hip IR 4+ 5  Hip ER 4 5     Warm Up:  Stationary Bike 4 minutes: Forward 2 and Backward 2   Treatment   Return to sport/dynamic for single leg plyometrics Jump down land 10x 2 sets Forward jump down single leg 10x; 2 sets Forward jump down to forward jump 10x ;   -Lateral jump to vertical jump (mock shooting) 10x  -Zig Zag/skier jump on outside leg as high as you can 40 ft x 3 sets -Single leg hop onto green step 10x 3 sets     Herding turtles 50 ft x2 trials  SLD 40 ft x 2 trials  Ice cup massage 4 minutes  Trigger Point Dry Needling (TDN), unbilled Education performed with patient regarding potential benefit of TDN. Reviewed precautions and risks with patient. Reviewed special precautions/risks over lung fields which include pneumothorax. Reviewed signs and symptoms of pneumothorax and advised pt to go to ER immediately if these symptoms develop advise them of dry needling treatment. Extensive time spent with pt to ensure full understanding of TDN risks.  Pt provided verbal consent to treatment. TDN performed to  with 0.25 x 40 single needle placements with local twitch response (LTR). Pistoning technique utilized. Improved pain-free motion following intervention. R quad x 2 minutes       PATIENT EDUCATION: Education details: exercise technique, body mechanics, protocol Person educated: Patient Education method: Explanation, Demonstration, Tactile cues, and Verbal cues Education comprehension: verbalized understanding, returned demonstration,  verbal cues required, and tactile cues required  HOME EXERCISE PROGRAM: Access Code: [email or web address] URL: [email or web address] Date: 12/15/2021 Prepared by: [name]  Exercises- Long Sitting Quad Set  - 1 x daily - 7 x weekly - 2 sets - 10 reps - 5 hold - Active Straight Leg Raise with Quad Set  - 1 x daily - 7 x weekly - 2 sets - 10 reps - 5 hold - Mini Squat with Counter Support  - 1 x daily - 7 x weekly - 2 sets - 10 reps - 5 hold - Side Lunge with Counter Support  - 1 x daily - 7 x weekly - 2 sets - 10 reps - 5 hold - Seated Ankle Alphabet  - 1 x daily - 7 x weekly - 2 sets - 10 reps - 5 hold  Access Code: [email or web address] URL: [email or web address] Date: 07/18/2022 Prepared by: [name]  Exercises - Supine Bridge  - 1 x daily - 7 x weekly - 2 sets - 10 reps - 5 hold - Single Leg Bridge  - 1 x daily - 7 x weekly - 2 sets - 10 reps - 5 hold - Split Squats Upright Trunk with Dumbbells (Quad Bias)  - 1 x daily - 7 x weekly - 2 sets - 10 reps - 5 hold - Wall Squat with Heel Raises  - 1 x daily - 7 x weekly - 2 sets - 10 reps - 5 hold - Full Leg Press  - 1 x daily - 7 x weekly - 3 sets - 10 reps - Eccentric Hamstring Curl with Weight Machine  - 1 x daily - 7 x weekly - 3 sets - 10 reps - Single Leg Knee Extension with Weight Machine  - 1 x daily - 7 x weekly - 3 sets - 10 reps   PT Short Term Goals       PT SHORT TERM GOAL #1   Title Patient will be independent in home exercise program to improve strength/mobility for better functional independence with ADLs.    Baseline 6/12; HEP given 7/20 : HEP compliant    Time 4    Status Achieved   Target Date 01/02/22      PT SHORT TERM GOAL #2   Title Patient will report a worst pain of 5/10 on VAS in R knee to improve tolerance with ADLs and reduced symptoms with activities.    Baseline 6/12: 8/10 7/20: 6/10 8/8: 5/10    Time 4    Period Weeks    Status MET   Target Date 01/02/22      PT SHORT TERM GOAL #3    Title Patient will increase 10 meter walk test to >1.7ms with LRAD as to improve gait speed for better community ambulation and to reduce fall risk.    Baseline 6/12:0.77 with crutches 7/20: 1.6 m/s    Time 4    Period Weeks    Status Achieved   Target Date 01/02/22      PT SHORT TERM GOAL #4   Title --    Baseline --  Time --    Period --    Status --    Target Date --      PT SHORT TERM GOAL #5   Title --    Baseline --    Time --    Period --    Status --    Target Date --              PT Long Term Goals      PT LONG TERM GOAL #1   Title Patient will increase FOTO score to equal to or greater than  71   to demonstrate statistically significant improvement in mobility and quality of life.    Baseline 6/12: 30 7/20: 51 8/8: 65% 8/31: 64% 10/5: 67%   Time 8 11/28: 71% 75%    Period Weeks    Status  Met   Target Date 07/18/2022           PT LONG TERM GOAL #2   Title Patient will report a worst pain of 3/10 on VAS in  R knee  to improve tolerance with ADLs and reduced symptoms with activities.    Baseline 6/12: 8/10 pain 7/20: 6/10 8/8: 5/10 8/31: 6/10 10/5: 5/10 11/28: 5/10 1/4: 4/10 1/23: 3/10    Time 8    Period Weeks    Status MET   Target Date 07/18/2022           PT LONG TERM GOAL #3   Title Patient will increase lower extremity functional scale to >60/80 to demonstrate improved functional mobility and increased tolerance with ADLs.    Baseline 6/12: 19/80 7/20: 45/80 8/8: 49/80 8/31 55/80 10/5: 58/80 11/28: 66/80   Time 8    Period Weeks    Status  Met   Target Date 05/25/2022         PT LONG TERM GOAL #4   Title Patient will improve R knee ROM (neutral to 120 degrees) for return to PLOF and safe functional mobility.    Baseline 6/12: locked in extension 7/20: seated 85 supine AROM: 75    extension: AROM -5 PROM -2 8/8:  supine AROM 75 PROM 80;   seated: AROM 73 PROM 86 8/31: supine:AROM 75 PROM 84   sitting; AROM 79 PROM 92 10/5: 128  degrees   Time 8    Period Weeks    Status MET   Target Date 03/28/2022       PT LONG TERM GOAL #5   Title Patient will increase six minute walk test distance to >1000 for progression to community ambulator and improve gait ability    Baseline 6/12: unable to test 7/20: 1340 w/o brace 8/8: 1690 ft    Time 8    Period Weeks    Status MET   Target Date 03/28/2022     PT LONG TERM GOAL #6  Title Patient will return to sport and running 5 miles without pain increase for return to PLOF   Baseline 8/8: unable to play or run 8/31: unable to 10/5: not cleared for return to sport yet 11/28: Patient was able to run 0.5 mile without stopping  1/4: 0.8 miles without stopping 1/23: 1 mile in 9: 16 minutes  3/5: 1.25 miles in 9.5 minutes   Time 8   Period Weeks   Status On going  Target Date 09/12/2022               PT LONG TERM GOAL #7  Title Patient will increase BLE  gross strength to 5/5 as to improve functional strength for independent gait, increased standing tolerance and increased ADL ability.  Baseline 10/5: see above 11/28: see above 1/23: see above  Time 8   Period Weeks   Status Partially Met  Target Date 09/12/2022             PT LONG TERM GOAL #8  Title Patient will increase single leg hop test on the (R) leg to an average of > 75 in to demonstrate symmetry between legs and the ability to hop in order to return to sport with full confidence.   Baseline 11/28: Right: 37.6, 39, 40 in (Average: 38.8 in) 1/4; 32. 41.5, 42 (average 38.5) 1/23: 36.5, 38.5, 41 (average 38.7)   3/5: 36, 43, 47 (average 42)   Time 8  Period Weeks   Status Partially Met  Target Date 09/12/2022         PT LONG TERM GOAL #9  Title Patient will increase Y-test on the (R) leg to an average of > 31.5 in to demonstrate symmetry between legs and increased ability to maintain single leg balance.    Baseline 11/28:(R) Forward (23.25 in), Behind Leg (31 in), Lateral (33 in), Average (29 in)  1/4: Right leg: Forward (34 in), Behind Leg (38 in), Lateral (33.5 in) Average (35.2 in)  Time 8  Period Weeks   Status MET  Target Date 07/18/2022        Plan       Clinical Impression Statement ***  Patient will continue to benefit from skilled physical therapy interventions in order to improve right knee strength, overall confidence in sport activities, and overall functional capability of the RLE in order to return allow return to his previous level of function.   Personal Factors and Comorbidities Profession;Transportation    Examination-Activity Limitations Bathing;Bed Mobility;Bend;Caring for Others;Carry;Dressing;Hygiene/Grooming;Lift;Locomotion Level;Toileting;Stand;Stairs;Squat;Sit;Transfers    Examination-Participation Restrictions Cleaning;Community Activity;Driving;Meal Prep;Laundry;Occupation;Shop;Yard Work;Volunteer    Stability/Clinical Decision Making Stable/Uncomplicated    Rehab Potential Good    PT Frequency 1x / week    PT Duration 8 weeks    PT Treatment/Interventions ADLs/Self Care Home Management;Cryotherapy;Electrical Stimulation;Iontophoresis '4mg'$ /ml Dexamethasone;Moist Heat;Gait training;DME Instruction;Stair training;Functional mobility training;Therapeutic activities;Therapeutic exercise;Patient/family education;Neuromuscular re-education;Balance training;Orthotic Fit/Training;Manual techniques;Passive range of motion;Scar mobilization;Manual lymph drainage;Dry needling;Energy conservation;Splinting;Taping    PT Next Visit Plan    PT Home Exercise Plan see above    Consulted and Agree with Plan of Care Patient             [name] PT  09/04/2022, 12:22 PM

## 2022-09-05 ENCOUNTER — Ambulatory Visit: Payer: 59

## 2022-09-05 DIAGNOSIS — M25561 Pain in right knee: Secondary | ICD-10-CM | POA: Diagnosis not present

## 2022-09-05 DIAGNOSIS — R262 Difficulty in walking, not elsewhere classified: Secondary | ICD-10-CM | POA: Diagnosis not present

## 2022-09-05 DIAGNOSIS — M25661 Stiffness of right knee, not elsewhere classified: Secondary | ICD-10-CM

## 2022-09-11 NOTE — Therapy (Signed)
OUTPATIENT PHYSICAL THERAPY TREATMENT NOTE/RECERT *** Patient Name: Douglas Adams MRN: [id number] DOB:08/19/2000, 22 y.o., male Today's Date: 09/11/2022  PCP: Saddie Benders MD  REFERRING PROVIDER: Saddie Benders MD                Past Medical History:  Diagnosis Date   Allergies    PONV (postoperative nausea and vomiting) 12/01/2021   pt's with extended recovery- vomiting x 3 hrs.   Past Surgical History:  Procedure Laterality Date   ADENOIDECTOMY AND MYRINGOTOMY WITH TUBE PLACEMENT Bilateral 2007   ANTERIOR CRUCIATE LIGAMENT REPAIR Right 12/01/2021   Procedure: RECONSTRUCTION ANTERIOR CRUCIATE LIGAMENT (ACL);  Surgeon: Hiram Gash, MD;  Location: [facility];  Service: Orthopedics;  Laterality: Right;   KNEE ARTHROSCOPY Right 03/02/2022   Procedure: ARTHROSCOPY KNEE;  Surgeon: Hiram Gash, MD;  Location: [facility];  Service: Orthopedics;  Laterality: Right;   LYSIS OF ADHESION Right 03/02/2022   Procedure: LYSIS OF ADHESION/MANIPULATION;  Surgeon: Hiram Gash, MD;  Location: [facility];  Service: Orthopedics;  Laterality: Right;   There are no problems to display for this patient.  REFERRING DIAG: ACL reconstruction and arthroscopy of R knee  THERAPY DIAG:  No diagnosis found.  Rationale for Evaluation and Treatment Rehabilitation  PERTINENT HISTORY: Patient is a 22 year old male s/p ACL reconstruction with meniscal repair 12/01/21. Patient was injured with a twisting injury while playing basketball where he felt a pop and was unable to bear weight. Protocol not provided however [name] has protocol from clinic: knee extension lock for one week, d/c brace by 4 weeks if quad control appropriate. Progress PROM AAROM and AROM as tolerated for 0-6 weeks; with limitation of 0-90 for first 4 weeks. Pt goes for manip under anesth on 03/02/22.   PRECAUTIONS: ACL protocol  SUBJECTIVE:  ***  PAIN:  Are you having pain? Yes: NPRS  scale: 1/10 Pain location: Rt Patella, quad   Pain description: post surgical Aggravating factors: movement Relieving factors: elevating, ice, rest  INTERVENTION    Right Left  Hip flexion 4+ 5  Hip Abduction 4 5  Hip Adduction 4+ 5  Knee Extension  4 5  Knee Flexion 4 5  Hip IR 4+ 5  Hip ER 4 5     Warm Up:  Stationary Bike 4 minutes: Forward 2 and Backward 2   Treatment   Return to sport/dynamic for single leg plyometrics Jump down land 10x  Forward jump down single leg 10x;  Forward jump down to forward jump 10x ;   -Lateral jump to vertical jump (mock shooting) 10x  -lateral jump to vertical jump then drive to basket x 8 each side  -Zig Zag/skier jump on outside leg as high as you can 40 ft x 3 sets -Single leg hop onto green step 10x  each LE  Activity Description: red=right hand blue =left hand ; lateral shuffle squat to hit pod Activity Setting:  The Blaze Pod Random setting was chosen to enhance cognitive processing and agility, providing an unpredictable environment to simulate real-world scenarios, and fostering quick reactions and adaptability.   Number of Pods:  6 Cycles/Sets:  2 Duration (Time or Hit Count):  20     Herding turtles 50 ft x2 trials  SLD 40 ft x 2 trials  Ice cup massage 4 minutes       PATIENT EDUCATION: Education details: exercise technique, body mechanics, protocol Person educated: Patient Education method: Explanation, Demonstration, Tactile cues,  and Verbal cues Education comprehension: verbalized understanding, returned demonstration, verbal cues required, and tactile cues required  HOME EXERCISE PROGRAM: Access Code: [email or web address] URL: [email or web address] Date: 12/15/2021 Prepared by: [name]  Exercises- Long Sitting Quad Set  - 1 x daily - 7 x weekly - 2 sets - 10 reps - 5 hold - Active Straight Leg Raise with Quad Set  - 1 x daily - 7 x weekly - 2 sets - 10 reps - 5 hold - Mini Squat with  Counter Support  - 1 x daily - 7 x weekly - 2 sets - 10 reps - 5 hold - Side Lunge with Counter Support  - 1 x daily - 7 x weekly - 2 sets - 10 reps - 5 hold - Seated Ankle Alphabet  - 1 x daily - 7 x weekly - 2 sets - 10 reps - 5 hold  Access Code: [email or web address] URL: [email or web address] Date: 07/18/2022 Prepared by: [name]  Exercises - Supine Bridge  - 1 x daily - 7 x weekly - 2 sets - 10 reps - 5 hold - Single Leg Bridge  - 1 x daily - 7 x weekly - 2 sets - 10 reps - 5 hold - Split Squats Upright Trunk with Dumbbells (Quad Bias)  - 1 x daily - 7 x weekly - 2 sets - 10 reps - 5 hold - Wall Squat with Heel Raises  - 1 x daily - 7 x weekly - 2 sets - 10 reps - 5 hold - Full Leg Press  - 1 x daily - 7 x weekly - 3 sets - 10 reps - Eccentric Hamstring Curl with Weight Machine  - 1 x daily - 7 x weekly - 3 sets - 10 reps - Single Leg Knee Extension with Weight Machine  - 1 x daily - 7 x weekly - 3 sets - 10 reps   PT Short Term Goals       PT SHORT TERM GOAL #1   Title Patient will be independent in home exercise program to improve strength/mobility for better functional independence with ADLs.    Baseline 6/12; HEP given 7/20 : HEP compliant    Time 4    Status Achieved   Target Date 01/02/22      PT SHORT TERM GOAL #2   Title Patient will report a worst pain of 5/10 on VAS in R knee to improve tolerance with ADLs and reduced symptoms with activities.    Baseline 6/12: 8/10 7/20: 6/10 8/8: 5/10    Time 4    Period Weeks    Status MET   Target Date 01/02/22      PT SHORT TERM GOAL #3   Title Patient will increase 10 meter walk test to >1.21m/s with LRAD as to improve gait speed for better community ambulation and to reduce fall risk.    Baseline 6/12:0.77 with crutches 7/20: 1.6 m/s    Time 4    Period Weeks    Status Achieved   Target Date 01/02/22      PT SHORT TERM GOAL #4   Title --    Baseline --    Time --    Period --    Status --    Target Date --       PT SHORT TERM GOAL #5   Title --    Baseline --    Time --    Period --    Status --    Target  Date --              PT Long Term Goals      PT LONG TERM GOAL #1   Title Patient will increase FOTO score to equal to or greater than  71   to demonstrate statistically significant improvement in mobility and quality of life.    Baseline 6/12: 30 7/20: 51 8/8: 65% 8/31: 64% 10/5: 67%   Time 8 11/28: 71% 75%    Period Weeks    Status  Met   Target Date 07/18/2022           PT LONG TERM GOAL #2   Title Patient will report a worst pain of 3/10 on VAS in  R knee  to improve tolerance with ADLs and reduced symptoms with activities.    Baseline 6/12: 8/10 pain 7/20: 6/10 8/8: 5/10 8/31: 6/10 10/5: 5/10 11/28: 5/10 1/4: 4/10 1/23: 3/10    Time 8    Period Weeks    Status MET   Target Date 07/18/2022           PT LONG TERM GOAL #3   Title Patient will increase lower extremity functional scale to >60/80 to demonstrate improved functional mobility and increased tolerance with ADLs.    Baseline 6/12: 19/80 7/20: 45/80 8/8: 49/80 8/31 55/80 10/5: 58/80 11/28: 66/80   Time 8    Period Weeks    Status  Met   Target Date 05/25/2022         PT LONG TERM GOAL #4   Title Patient will improve R knee ROM (neutral to 120 degrees) for return to PLOF and safe functional mobility.    Baseline 6/12: locked in extension 7/20: seated 85 supine AROM: 75    extension: AROM -5 PROM -2 8/8:  supine AROM 75 PROM 80;   seated: AROM 73 PROM 86 8/31: supine:AROM 75 PROM 84   sitting; AROM 79 PROM 92 10/5: 128 degrees   Time 8    Period Weeks    Status MET   Target Date 03/28/2022       PT LONG TERM GOAL #5   Title Patient will increase six minute walk test distance to >1000 for progression to community ambulator and improve gait ability    Baseline 6/12: unable to test 7/20: 1340 w/o brace 8/8: 1690 ft    Time 8    Period Weeks    Status MET   Target Date 03/28/2022     PT LONG  TERM GOAL #6  Title Patient will return to sport and running 5 miles without pain increase for return to PLOF   Baseline 8/8: unable to play or run 8/31: unable to 10/5: not cleared for return to sport yet 11/28: Patient was able to run 0.5 mile without stopping  1/4: 0.8 miles without stopping 1/23: 1 mile in 9: 16 minutes  3/5: 1.25 miles in 9.5 minutes   Time 8   Period Weeks   Status On going  Target Date 09/12/2022               PT LONG TERM GOAL #7  Title Patient will increase BLE gross strength to 5/5 as to improve functional strength for independent gait, increased standing tolerance and increased ADL ability.  Baseline 10/5: see above 11/28: see above 1/23: see above  Time 8   Period Weeks   Status Partially Met  Target Date 09/12/2022  PT LONG TERM GOAL #8  Title Patient will increase single leg hop test on the (R) leg to an average of > 75 in to demonstrate symmetry between legs and the ability to hop in order to return to sport with full confidence.   Baseline 11/28: Right: 37.6, 39, 40 in (Average: 38.8 in) 1/4; 32. 41.5, 42 (average 38.5) 1/23: 36.5, 38.5, 41 (average 38.7)   3/5: 36, 43, 47 (average 42)   Time 8  Period Weeks   Status Partially Met  Target Date 09/12/2022         PT LONG TERM GOAL #9  Title Patient will increase Y-test on the (R) leg to an average of > 31.5 in to demonstrate symmetry between legs and increased ability to maintain single leg balance.    Baseline 11/28:(R) Forward (23.25 in), Behind Leg (31 in), Lateral (33 in), Average (29 in) 1/4: Right leg: Forward (34 in), Behind Leg (38 in), Lateral (33.5 in) Average (35.2 in)  Time 8  Period Weeks   Status MET  Target Date 07/18/2022        Plan       Clinical Impression Statement ***  Patient will continue to benefit from skilled physical therapy interventions in order to improve right knee strength, overall confidence in sport activities, and overall  functional capability of the RLE in order to return allow return to his previous level of function.   Personal Factors and Comorbidities Profession;Transportation    Examination-Activity Limitations Bathing;Bed Mobility;Bend;Caring for Others;Carry;Dressing;Hygiene/Grooming;Lift;Locomotion Level;Toileting;Stand;Stairs;Squat;Sit;Transfers    Examination-Participation Restrictions Cleaning;Community Activity;Driving;Meal Prep;Laundry;Occupation;Shop;Yard Work;Volunteer    Stability/Clinical Decision Making Stable/Uncomplicated    Rehab Potential Good    PT Frequency 1x / week    PT Duration 8 weeks    PT Treatment/Interventions ADLs/Self Care Home Management;Cryotherapy;Electrical Stimulation;Iontophoresis 4mg /ml Dexamethasone;Moist Heat;Gait training;DME Instruction;Stair training;Functional mobility training;Therapeutic activities;Therapeutic exercise;Patient/family education;Neuromuscular re-education;Balance training;Orthotic Fit/Training;Manual techniques;Passive range of motion;Scar mobilization;Manual lymph drainage;Dry needling;Energy conservation;Splinting;Taping    PT Next Visit Plan Combine forward/backward jumping   PT Home Exercise Plan see above    Consulted and Agree with Plan of Care Patient             [name] PT  09/11/2022, 1:21 PM

## 2022-09-12 ENCOUNTER — Ambulatory Visit: Payer: 59

## 2022-09-12 DIAGNOSIS — M25561 Pain in right knee: Secondary | ICD-10-CM | POA: Diagnosis not present

## 2022-09-12 DIAGNOSIS — M25661 Stiffness of right knee, not elsewhere classified: Secondary | ICD-10-CM

## 2022-09-12 DIAGNOSIS — R262 Difficulty in walking, not elsewhere classified: Secondary | ICD-10-CM | POA: Diagnosis not present

## 2022-09-19 ENCOUNTER — Ambulatory Visit: Payer: 59

## 2022-09-19 DIAGNOSIS — S83511D Sprain of anterior cruciate ligament of right knee, subsequent encounter: Secondary | ICD-10-CM | POA: Diagnosis not present

## 2023-04-16 DIAGNOSIS — H5203 Hypermetropia, bilateral: Secondary | ICD-10-CM | POA: Diagnosis not present

## 2023-05-07 DIAGNOSIS — J029 Acute pharyngitis, unspecified: Secondary | ICD-10-CM | POA: Diagnosis not present

## 2023-05-07 DIAGNOSIS — R6883 Chills (without fever): Secondary | ICD-10-CM | POA: Diagnosis not present

## 2023-05-21 ENCOUNTER — Ambulatory Visit
Admission: RE | Admit: 2023-05-21 | Discharge: 2023-05-21 | Disposition: A | Discharge status: Home / Self Care | Payer: 59 | Source: Ambulatory Visit | Attending: Family Medicine | Admitting: Family Medicine

## 2023-05-21 VITALS — BP 134/87 | HR 78 | Temp 98.0°F | Resp 18

## 2023-05-21 DIAGNOSIS — R051 Acute cough: Secondary | ICD-10-CM

## 2023-05-21 DIAGNOSIS — J0140 Acute pansinusitis, unspecified: Secondary | ICD-10-CM | POA: Diagnosis not present

## 2023-05-21 MED ORDER — AZITHROMYCIN 250 MG PO TABS: ORAL_TABLET | ORAL | 0 refills | Status: AC

## 2023-05-21 NOTE — ED Triage Notes (Signed)
Patient to Urgent Care with complaints of nasal congestion/ sinus pain and pressure. Post-nasal drip and cough.  Symptoms started 15 days ago. Negative for Covid/ strep/ flu two weeks ago. Denies any known fevers.  Has been taking Dayquil/ Nyquil/ tylenol/ ibuprofen.

## 2023-05-21 NOTE — ED Provider Notes (Signed)
Renaldo Fiddler    CSN: [id number] Arrival date & time: 05/21/23  1254      History   Chief Complaint Chief Complaint  Patient presents with   Nasal Congestion    I have been sick for two weeks - Entered by patient    HPI Douglas Adams is a 22 y.o. male.   HPI Patient presents for patient of 2 weeks of nasal congestion, sinus pressure, headache, cough and postnasal drainage.  Patient reports he was seen by provider 15 days ago and had a negative COVID and flu test.  The symptoms have continued to wax and wane and over the last few days has worsened.  Patient denies any history of asthma although has allergies.  He has been taking over-the-counter medication without improvement of symptoms. Past Medical History:  Diagnosis Date   Allergies    PONV (postoperative nausea and vomiting) 12/01/2021   pt's with extended recovery- vomiting x 3 hrs.    There are no problems to display for this patient.   Past Surgical History:  Procedure Laterality Date   ADENOIDECTOMY AND MYRINGOTOMY WITH TUBE PLACEMENT Bilateral 2007   ANTERIOR CRUCIATE LIGAMENT REPAIR Right 12/01/2021   Procedure: RECONSTRUCTION ANTERIOR CRUCIATE LIGAMENT (ACL);  Surgeon: Bjorn Pippin, MD;  Location: [facility];  Service: Orthopedics;  Laterality: Right;   KNEE ARTHROSCOPY Right 03/02/2022   Procedure: ARTHROSCOPY KNEE;  Surgeon: Bjorn Pippin, MD;  Location: [facility];  Service: Orthopedics;  Laterality: Right;   LYSIS OF ADHESION Right 03/02/2022   Procedure: LYSIS OF ADHESION/MANIPULATION;  Surgeon: Bjorn Pippin, MD;  Location: [facility];  Service: Orthopedics;  Laterality: Right;       Home Medications    Prior to Admission medications   Medication Sig Start Date End Date Taking? Authorizing Provider  azithromycin (ZITHROMAX) 250 MG tablet Take 2 tabs PO x 1 dose, then 1 tab PO QD x 4 days 05/21/23  Yes Bing Neighbors, NP  cetirizine (ZYRTEC) 10 MG  tablet Take 10 mg by mouth daily.    [provider]  fluticasone (FLONASE) 50 MCG/ACT nasal spray Place 1 spray into the nose daily. Patient not taking: Reported on 05/21/2023 03/30/11 03/29/12  Georgiann Hahn, MD  meloxicam (MOBIC) 15 MG tablet Take 1 tablet (15 mg total) by mouth daily. For 2 weeks for pain and inflammation. Then take as needed Patient not taking: Reported on 05/21/2023 03/02/22   Vernetta Honey, PA-C  methocarbamol (ROBAXIN) 500 MG tablet Take 1 tablet (500 mg total) by mouth every 8 (eight) hours as needed for muscle spasms. Patient not taking: Reported on 05/21/2023 03/02/22   Vernetta Honey, PA-C  montelukast (SINGULAIR) 10 MG tablet Take 10 mg by mouth at bedtime.    [provider]  promethazine (PHENERGAN) 25 MG tablet Take 1 tablet (25 mg total) by mouth every 6 (six) hours as needed for nausea or vomiting. Patient not taking: Reported on 05/21/2023 03/02/22   Vernetta Honey, PA-C    Family History History reviewed. No pertinent family history.  Social History Social History   Tobacco Use   Smoking status: Never   Smokeless tobacco: Never  Vaping Use   Vaping status: Never Used  Substance Use Topics   Alcohol use: No   Drug use: Never     Allergies   Augmentin [amoxicillin-pot clavulanate]   Review of Systems Review of Systems Pertinent negatives listed in HPI   Physical Exam  Triage Vital Signs ED Triage Vitals  Encounter Vitals Group     BP 05/21/23 1304 134/87     Systolic BP Percentile --      Diastolic BP Percentile --      Pulse Rate 05/21/23 1304 78     Resp 05/21/23 1304 18     Temp 05/21/23 1304 98 F (36.7 C)     Temp src --      SpO2 05/21/23 1304 98 %     Weight --      Height --      Head Circumference --      Peak Flow --      Pain Score 05/21/23 1303 4     Pain Loc --      Pain Education --      Exclude from Growth Chart --    No data found.  Updated Vital Signs BP 134/87   Pulse 78    Temp 98 F (36.7 C)   Resp 18   SpO2 98%   Visual Acuity Right Eye Distance:   Left Eye Distance:   Bilateral Distance:    Right Eye Near:   Left Eye Near:    Bilateral Near:     Physical Exam Vitals reviewed.  Constitutional:      General: He is not in acute distress.    Appearance: Normal appearance. He is ill-appearing.  HENT:     Head: Normocephalic and atraumatic.     Right Ear: External ear normal.     Left Ear: External ear normal.     Nose: Nasal tenderness, mucosal edema, congestion and rhinorrhea present. Rhinorrhea is purulent.     Mouth/Throat:     Pharynx: No oropharyngeal exudate or posterior oropharyngeal erythema.  Eyes:     Extraocular Movements: Extraocular movements intact.     Conjunctiva/sclera: Conjunctivae normal.     Pupils: Pupils are equal, round, and reactive to light.  Cardiovascular:     Rate and Rhythm: Normal rate and regular rhythm.  Pulmonary:     Effort: Pulmonary effort is normal.     Breath sounds: Normal breath sounds.  Musculoskeletal:        General: Normal range of motion.     Cervical back: Normal range of motion.  Lymphadenopathy:     Cervical: Cervical adenopathy present.  Neurological:     General: No focal deficit present.     Mental Status: He is alert.    UC Treatments / Results  Labs (all labs ordered are listed, but only abnormal results are displayed) Labs Reviewed - No data to display  EKG   Radiology No results found.  Procedures Procedures (including critical care time)  Medications Ordered in UC Medications - No data to display  Initial Impression / Assessment and Plan / UC Course  I have reviewed the triage vital signs and the nursing notes.  Pertinent labs & imaging results that were available during my care of the patient were reviewed by me and considered in my medical decision making (see chart for details).    Symptoms and evaluation findings consistent with acute nonrecurrent pansinusitis.   Empiric treatment with Azithromycin Take 2 tabs x 1 dose, then 1 tab every day for x 4 days.Encouraged to resume cetirizine daily at bedtime to help with postnasal drainage and nasal symptoms.  Force fluids.  Return precautions given if symptoms worsen or do not improve once treatment is completed. Final Clinical Impressions(s) / UC Diagnoses   Final diagnoses:  Acute non-recurrent pansinusitis  Acute cough     Discharge Instructions      You are being treated for a sinus infection.  Take azithromycin as prescribed.  For postnasal drainage recommend resuming use of Zyrtec 10 mg at bedtime.  Hydrate well with fluids.  Her symptoms should begin to improve over the course of the next few days.  Return times do not improve following completion of treatment.     ED Prescriptions     Medication Sig Dispense Auth. Provider   azithromycin (ZITHROMAX) 250 MG tablet Take 2 tabs PO x 1 dose, then 1 tab PO QD x 4 days 6 tablet Bing Neighbors, NP      PDMP not reviewed this encounter.   Bing Neighbors, NP 05/21/23 1419

## 2023-05-21 NOTE — Discharge Instructions (Signed)
You are being treated for a sinus infection.  Take azithromycin as prescribed.  For postnasal drainage recommend resuming use of Zyrtec 10 mg at bedtime.  Hydrate well with fluids.  Her symptoms should begin to improve over the course of the next few days.  Return times do not improve following completion of treatment.

## 2023-12-06 ENCOUNTER — Ambulatory Visit
Admission: RE | Admit: 2023-12-06 | Discharge: 2023-12-06 | Disposition: A | Discharge status: Home / Self Care | Source: Ambulatory Visit | Attending: Emergency Medicine | Admitting: Emergency Medicine

## 2023-12-06 VITALS — BP 133/73 | HR 75 | Temp 98.6°F | Resp 18

## 2023-12-06 DIAGNOSIS — J0100 Acute maxillary sinusitis, unspecified: Secondary | ICD-10-CM | POA: Diagnosis not present

## 2023-12-06 LAB — POC COVID19/FLU A&B COMBO
Covid Antigen, POC: NEGATIVE
Influenza A Antigen, POC: NEGATIVE
Influenza B Antigen, POC: NEGATIVE

## 2023-12-06 MED ORDER — PREDNISONE 10 MG (21) PO TBPK: ORAL_TABLET | Freq: Every day | ORAL | 0 refills | Status: AC

## 2023-12-06 MED ORDER — DOXYCYCLINE HYCLATE 100 MG PO CAPS: 100.0000 mg | ORAL_CAPSULE | Freq: Two times a day (BID) | ORAL | 0 refills | Status: AC

## 2023-12-06 NOTE — ED Triage Notes (Signed)
 Congestion, sinus pressure, ear fullness x 4 days. Taking sudafed.

## 2023-12-06 NOTE — ED Provider Notes (Signed)
 Douglas Adams    CSN: [id number] Arrival date & time: 12/06/23  1731      History   Chief Complaint Chief Complaint  Patient presents with   Nasal Congestion    I have felt really bad the past few days and want to get checked out. - Entered by patient    HPI Douglas Adams is a 23 y.o. male.   Patient presents for evaluation of nasal congestion, rhinorrhea, productive cough, bilateral ear fullness occurring intermittently, sinus pressure to the bilateral cheeks, sore throat only in the morning, postnasal drip at nighttime and intermittent diarrhea present for 4 days.  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  Has attempted use of DayQuil, Sudafed and Zyrtec.  Tolerating food and liquids.  Denies fever, shortness of breath or wheezing.  Past Medical History:  Diagnosis Date   Allergies    PONV (postoperative nausea and vomiting) 12/01/2021   pt's with extended recovery- vomiting x 3 hrs.    There are no active problems to display for this patient.   Past Surgical History:  Procedure Laterality Date   ADENOIDECTOMY AND MYRINGOTOMY WITH TUBE PLACEMENT Bilateral 2007   ANTERIOR CRUCIATE LIGAMENT REPAIR Right 12/01/2021   Procedure: RECONSTRUCTION ANTERIOR CRUCIATE LIGAMENT (ACL);  Surgeon: Micheline Ahr, MD;  Location: [facility];  Service: Orthopedics;  Laterality: Right;   KNEE ARTHROSCOPY Right 03/02/2022   Procedure: ARTHROSCOPY KNEE;  Surgeon: Micheline Ahr, MD;  Location: [facility];  Service: Orthopedics;  Laterality: Right;   LYSIS OF ADHESION Right 03/02/2022   Procedure: LYSIS OF ADHESION/MANIPULATION;  Surgeon: Micheline Ahr, MD;  Location: [facility];  Service: Orthopedics;  Laterality: Right;       Home Medications    Prior to Admission medications   Medication Sig Start Date End Date Taking? Authorizing Provider  cetirizine (ZYRTEC) 10 MG tablet Take 10 mg by mouth daily.   Yes [provider]   doxycycline (VIBRAMYCIN) 100 MG capsule Take 1 capsule (100 mg total) by mouth 2 (two) times daily. 12/06/23  Yes Keinan Brouillet R, NP  predniSONE (STERAPRED UNI-PAK 21 TAB) 10 MG (21) TBPK tablet Take by mouth daily. Take 6 tabs by mouth daily  for 1 days, then 5 tabs for 1 days, then 4 tabs for 1 days, then 3 tabs for 1 days, 2 tabs for 1 days, then 1 tab by mouth daily for 1 days 12/06/23  Yes Reena Canning, NP  azithromycin  (ZITHROMAX ) 250 MG tablet Take 2 tabs PO x 1 dose, then 1 tab PO QD x 4 days 05/21/23   Buena Carmine, NP  fluticasone  (FLONASE ) 50 MCG/ACT nasal spray Place 1 spray into the nose daily. Patient not taking: Reported on 05/21/2023 03/30/11 03/29/12  Ramgoolam, Andres, MD  meloxicam  (MOBIC ) 15 MG tablet Take 1 tablet (15 mg total) by mouth daily. For 2 weeks for pain and inflammation. Then take as needed Patient not taking: Reported on 05/21/2023 03/02/22   McBane, Caroline N, PA-C  methocarbamol  (ROBAXIN ) 500 MG tablet Take 1 tablet (500 mg total) by mouth every 8 (eight) hours as needed for muscle spasms. Patient not taking: Reported on 05/21/2023 03/02/22   McBane, Caroline N, PA-C  montelukast (SINGULAIR) 10 MG tablet Take 10 mg by mouth at bedtime.    [provider]  promethazine  (PHENERGAN ) 25 MG tablet Take 1 tablet (25 mg total) by mouth every 6 (six) hours as needed for nausea  or vomiting. Patient not taking: Reported on 05/21/2023 03/02/22   Adine Ahmadi, PA-C    Family History History reviewed. No pertinent family history.  Social History Social History   Tobacco Use   Smoking status: Never   Smokeless tobacco: Never  Vaping Use   Vaping status: Never Used  Substance Use Topics   Alcohol use: No   Drug use: Never     Allergies   Augmentin [amoxicillin-pot clavulanate]   Review of Systems Review of Systems   Physical Exam Triage Vital Signs ED Triage Vitals  Encounter Vitals Group     BP 12/06/23 1743 133/73     Girls  Systolic BP Percentile --      Girls Diastolic BP Percentile --      Boys Systolic BP Percentile --      Boys Diastolic BP Percentile --      Pulse Rate 12/06/23 1743 75     Resp 12/06/23 1743 18     Temp 12/06/23 1743 98.6 F (37 C)     Temp Source 12/06/23 1743 Oral     SpO2 12/06/23 1743 95 %     Weight --      Height --      Head Circumference --      Peak Flow --      Pain Score 12/06/23 1745 3     Pain Loc --      Pain Education --      Exclude from Growth Chart --    No data found.  Updated Vital Signs BP 133/73 (BP Location: Left Arm)   Pulse 75   Temp 98.6 F (37 C) (Oral)   Resp 18   SpO2 95%   Visual Acuity Right Eye Distance:   Left Eye Distance:   Bilateral Distance:    Right Eye Near:   Left Eye Near:    Bilateral Near:     Physical Exam Constitutional:      Appearance: Normal appearance.  HENT:     Head: Normocephalic.     Right Ear: Tympanic membrane, ear canal and external ear normal.     Left Ear: Tympanic membrane, ear canal and external ear normal.     Nose: Congestion present.     Right Sinus: Maxillary sinus tenderness present.     Left Sinus: Maxillary sinus tenderness present.     Mouth/Throat:     Pharynx: No oropharyngeal exudate or posterior oropharyngeal erythema.   Eyes:     Extraocular Movements: Extraocular movements intact.    Cardiovascular:     Rate and Rhythm: Normal rate and regular rhythm.     Pulses: Normal pulses.     Heart sounds: Normal heart sounds.  Pulmonary:     Effort: Pulmonary effort is normal.     Breath sounds: Normal breath sounds.   Musculoskeletal:     Cervical back: Normal range of motion and neck supple.   Neurological:     Mental Status: He is alert and oriented to person, place, and time. Mental status is at baseline.      UC Treatments / Results  Labs (all labs ordered are listed, but only abnormal results are displayed) Labs Reviewed  POC COVID19/FLU A&B COMBO     EKG   Radiology No results found.  Procedures Procedures (including critical care time)  Medications Ordered in UC Medications - No data to display  Initial Impression / Assessment and Plan / UC Course  I have reviewed the triage  vital signs and the nursing notes.  Pertinent labs & imaging results that were available during my care of the patient were reviewed by me and considered in my medical decision making (see chart for details).  Acute nonrecurrent maxillary sinusitis  Patient is in no signs of distress nor toxic appearing.  Vital signs are stable.  Low suspicion for pneumonia, pneumothorax or bronchitis and therefore will defer imaging.  Presentation and symptomology consistent with a sinusitis, discussed with patient, etiology most likely viral, empirically placed on doxycycline to prevent worsening of symptoms, prescribed prednisone for treatment and recommended additional supportive measures.May use additional over-the-counter medications as needed for supportive care.  May follow-up with urgent care as needed if symptoms persist or worsen.   Final Clinical Impressions(s) / UC Diagnoses   Final diagnoses:  Acute non-recurrent maxillary sinusitis     Discharge Instructions      Your symptoms today are most likely being caused by a virus and should steadily improve in time it can take up to 7 to 10 days before you truly start to see a turnaround however things will get better  Barely placed on antibiotics no symptoms do not worsen or linger, take doxycycline twice daily for 10 days  Begin prednisone every morning with food to reduce sinus pressure, start tomorrow morning    You can take Tylenol  and/or Ibuprofen  as needed for fever reduction and pain relief.   For cough: honey 1/2 to 1 teaspoon (you can dilute the honey in water or another fluid).  You can also use guaifenesin and dextromethorphan for cough. You can use a humidifier for chest congestion and cough.   If you don't have a humidifier, you can sit in the bathroom with the hot shower running.      For sore throat: try warm salt water gargles, cepacol lozenges, throat spray, warm tea or water with lemon/honey, popsicles or ice, or OTC cold relief medicine for throat discomfort.   For congestion: take a daily anti-histamine like Zyrtec, Claritin, and a oral decongestant, such as pseudoephedrine.  You can also use Flonase  1-2 sprays in each nostril daily.   It is important to stay hydrated: drink plenty of fluids (water, gatorade/powerade/pedialyte, juices, or teas) to keep your throat moisturized and help further relieve irritation/discomfort.    ED Prescriptions     Medication Sig Dispense Auth. Provider   doxycycline (VIBRAMYCIN) 100 MG capsule Take 1 capsule (100 mg total) by mouth 2 (two) times daily. 20 capsule Kerry Odonohue R, NP   predniSONE (STERAPRED UNI-PAK 21 TAB) 10 MG (21) TBPK tablet Take by mouth daily. Take 6 tabs by mouth daily  for 1 days, then 5 tabs for 1 days, then 4 tabs for 1 days, then 3 tabs for 1 days, 2 tabs for 1 days, then 1 tab by mouth daily for 1 days 21 tablet Gwin Eagon, Maybelle Spatz, NP      PDMP not reviewed this encounter.   Reena Canning, NP 12/06/23 1824

## 2023-12-06 NOTE — Discharge Instructions (Signed)
 Your symptoms today are most likely being caused by a virus and should steadily improve in time it can take up to 7 to 10 days before you truly start to see a turnaround however things will get better  Barely placed on antibiotics no symptoms do not worsen or linger, take doxycycline twice daily for 10 days  Begin prednisone every morning with food to reduce sinus pressure, start tomorrow morning    You can take Tylenol  and/or Ibuprofen  as needed for fever reduction and pain relief.   For cough: honey 1/2 to 1 teaspoon (you can dilute the honey in water or another fluid).  You can also use guaifenesin and dextromethorphan for cough. You can use a humidifier for chest congestion and cough.  If you don't have a humidifier, you can sit in the bathroom with the hot shower running.      For sore throat: try warm salt water gargles, cepacol lozenges, throat spray, warm tea or water with lemon/honey, popsicles or ice, or OTC cold relief medicine for throat discomfort.   For congestion: take a daily anti-histamine like Zyrtec, Claritin, and a oral decongestant, such as pseudoephedrine.  You can also use Flonase  1-2 sprays in each nostril daily.   It is important to stay hydrated: drink plenty of fluids (water, gatorade/powerade/pedialyte, juices, or teas) to keep your throat moisturized and help further relieve irritation/discomfort.

## 2024-06-13 DIAGNOSIS — R051 Acute cough: Secondary | ICD-10-CM | POA: Diagnosis not present

## 2024-06-13 DIAGNOSIS — R509 Fever, unspecified: Secondary | ICD-10-CM | POA: Diagnosis not present
